# Patient Record
Sex: Female | Born: 1990 | Race: Black or African American | Hispanic: No | Marital: Married | Smoking: Never smoker
Health system: Southern US, Community
[De-identification: ages and names within clinical notes are randomized; demographics above are authoritative.]

## PROBLEM LIST (undated history)

## (undated) ENCOUNTER — Inpatient Hospital Stay (HOSPITAL_COMMUNITY): Payer: Self-pay

## (undated) ENCOUNTER — Ambulatory Visit: Admission: EM

## (undated) DIAGNOSIS — E7800 Pure hypercholesterolemia, unspecified: Secondary | ICD-10-CM

## (undated) DIAGNOSIS — G8929 Other chronic pain: Secondary | ICD-10-CM

## (undated) DIAGNOSIS — R7303 Prediabetes: Secondary | ICD-10-CM

## (undated) DIAGNOSIS — H509 Unspecified strabismus: Secondary | ICD-10-CM

## (undated) DIAGNOSIS — O24419 Gestational diabetes mellitus in pregnancy, unspecified control: Secondary | ICD-10-CM

## (undated) DIAGNOSIS — M546 Pain in thoracic spine: Secondary | ICD-10-CM

## (undated) HISTORY — DX: Prediabetes: R73.03

## (undated) HISTORY — DX: Other chronic pain: G89.29

## (undated) HISTORY — DX: Pure hypercholesterolemia, unspecified: E78.00

## (undated) HISTORY — DX: Unspecified strabismus: H50.9

## (undated) HISTORY — DX: Pain in thoracic spine: M54.6

## (undated) HISTORY — PX: NO PAST SURGERIES: SHX2092

---
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
## 2014-01-17 ENCOUNTER — Encounter: Payer: Self-pay | Admitting: Obstetrics and Gynecology

## 2014-01-17 ENCOUNTER — Ambulatory Visit (INDEPENDENT_AMBULATORY_CARE_PROVIDER_SITE_OTHER): Payer: Self-pay | Admitting: Obstetrics and Gynecology

## 2014-01-17 VITALS — BP 116/98 | HR 103 | Ht 65.25 in | Wt 184.0 lb

## 2014-01-17 DIAGNOSIS — Z113 Encounter for screening for infections with a predominantly sexual mode of transmission: Secondary | ICD-10-CM

## 2014-01-17 DIAGNOSIS — Z3403 Encounter for supervision of normal first pregnancy, third trimester: Secondary | ICD-10-CM

## 2014-01-17 DIAGNOSIS — O0933 Supervision of pregnancy with insufficient antenatal care, third trimester: Secondary | ICD-10-CM | POA: Insufficient documentation

## 2014-01-17 DIAGNOSIS — B951 Streptococcus, group B, as the cause of diseases classified elsewhere: Secondary | ICD-10-CM

## 2014-01-17 DIAGNOSIS — Z118 Encounter for screening for other infectious and parasitic diseases: Secondary | ICD-10-CM

## 2014-01-17 DIAGNOSIS — N390 Urinary tract infection, site not specified: Secondary | ICD-10-CM

## 2014-01-17 DIAGNOSIS — Z124 Encounter for screening for malignant neoplasm of cervix: Secondary | ICD-10-CM

## 2014-01-17 DIAGNOSIS — R8271 Bacteriuria: Secondary | ICD-10-CM

## 2014-01-17 DIAGNOSIS — Z1151 Encounter for screening for human papillomavirus (HPV): Secondary | ICD-10-CM

## 2014-01-17 LAB — POCT URINALYSIS DIP (DEVICE)
Bilirubin Urine: NEGATIVE
GLUCOSE, UA: NEGATIVE mg/dL
Ketones, ur: NEGATIVE mg/dL
Nitrite: NEGATIVE
Protein, ur: NEGATIVE mg/dL
Specific Gravity, Urine: 1.02 (ref 1.005–1.030)
UROBILINOGEN UA: 0.2 mg/dL (ref 0.0–1.0)
pH: 7 (ref 5.0–8.0)

## 2014-01-17 LAB — POCT PREGNANCY, URINE: Preg Test, Ur: POSITIVE — AB

## 2014-01-17 MED ORDER — PRENATAL VITAMINS 0.8 MG PO TABS: 1.0000 | ORAL_TABLET | Freq: Every day | ORAL | Status: DC

## 2014-01-17 MED ORDER — HYDROCORTISONE 1 % EX OINT: 1.0000 "application " | TOPICAL_OINTMENT | Freq: Two times a day (BID) | CUTANEOUS | Status: DC

## 2014-01-17 NOTE — Progress Notes (Deleted)
Nutrition note: 1st visit consult Pt has h/o obesity. Pt has lost 12# @

## 2014-01-17 NOTE — Progress Notes (Signed)
   Subjective:    Morgan Mathews is a G1P0 [id number] being seen today for her first obstetrical visit.  Her obstetrical history is significant for late to care at 32 weeks. Patient does intend to breast feed. Pregnancy history fully reviewed.  Patient reports generalized pruritis. Patient reports onset of pruritis 3 days ago since her arrival from [place]. She is staying with a friend and husband who do not suffer from this.   Filed Vitals:   01/17/14 0840  BP: 116/98  Pulse: 103  Weight: 184 lb (83.462 kg)    HISTORY: OB History  Gravida Para Term Preterm AB SAB TAB Ectopic Multiple Living  1             # Outcome Date GA Lbr Len/2nd Weight Sex Delivery Anes PTL Lv  1 Current              Past Medical History  Diagnosis Date  . Medical history non-contributory    Past Surgical History  Procedure Laterality Date  . No past surgeries     History reviewed. No pertinent family history.   Exam    Uterus:     Pelvic Exam:    Perineum: Normal Perineum   Vulva: normal   Vagina:  normal mucosa, normal discharge   pH:    Cervix: closed and long   Adnexa: not evaluated   Bony Pelvis: gynecoid  System: Breast:  normal appearance, no masses or tenderness   Skin: Multiple skin lesions and scars    Neurologic: oriented, no focal deficits   Extremities: normal strength, tone, and muscle mass   HEENT extra ocular movement intact   Mouth/Teeth mucous membranes moist, pharynx normal without lesions and dental hygiene good   Neck supple and no masses   Cardiovascular: regular rate and rhythm   Respiratory:  chest clear, no wheezing, crepitations, rhonchi, normal symmetric air entry   Abdomen: soft, gravid   Urinary:       Assessment:    Pregnancy: G1P0 Patient Active Problem List   Diagnosis Date Noted  . Late prenatal care affecting pregnancy in third trimester, antepartum 01/17/2014  . Supervision of normal first pregnancy in third trimester 01/17/2014        Plan:      Initial labs drawn. Prenatal vitamins. Problem list reviewed and updated. Genetic Screening discussed : too late.  Ultrasound discussed; fetal survey: ordered. Bile acids collected Rx hydrocortisone  Follow up in 2 weeks. 50% of 30 min visit spent on counseling and coordination of care.     [name] 01/17/2014

## 2014-01-17 NOTE — Progress Notes (Signed)
Ultrasound 01/25/14 @ 2p with Radiology.

## 2014-01-17 NOTE — Patient Instructions (Signed)
Third Trimester of Pregnancy The third trimester is from week 29 through week 42, months 7 through 9. The third trimester is a time when the fetus is growing rapidly. At the end of the ninth month, the fetus is about 20 inches in length and weighs 6-10 pounds.  BODY CHANGES Your body goes through many changes during pregnancy. The changes vary from woman to woman.   Your weight will continue to increase. You can expect to gain 25-35 pounds (11-16 kg) by the end of the pregnancy.  You may begin to get stretch marks on your hips, abdomen, and breasts.  You may urinate more often because the fetus is moving lower into your pelvis and pressing on your bladder.  You may develop or continue to have heartburn as a result of your pregnancy.  You may develop constipation because certain hormones are causing the muscles that push waste through your intestines to slow down.  You may develop hemorrhoids or swollen, bulging veins (varicose veins).  You may have pelvic pain because of the weight gain and pregnancy hormones relaxing your joints between the bones in your pelvis. Backaches may result from overexertion of the muscles supporting your posture.  You may have changes in your hair. These can include thickening of your hair, rapid growth, and changes in texture. Some women also have hair loss during or after pregnancy, or hair that feels dry or thin. Your hair will most likely return to normal after your baby is born.  Your breasts will continue to grow and be tender. A yellow discharge may leak from your breasts called colostrum.  Your belly button may stick out.  You may feel short of breath because of your expanding uterus.  You may notice the fetus "dropping," or moving lower in your abdomen.  You may have a bloody mucus discharge. This usually occurs a few days to a week before labor begins.  Your cervix becomes thin and soft (effaced) near your due date. WHAT TO EXPECT AT YOUR  PRENATAL EXAMS  You will have prenatal exams every 2 weeks until week 36. Then, you will have weekly prenatal exams. During a routine prenatal visit:  You will be weighed to make sure you and the fetus are growing normally.  Your blood pressure is taken.  Your abdomen will be measured to track your baby's growth.  The fetal heartbeat will be listened to.  Any test results from the previous visit will be discussed.  You may have a cervical check near your due date to see if you have effaced. At around 36 weeks, your caregiver will check your cervix. At the same time, your caregiver will also perform a test on the secretions of the vaginal tissue. This test is to determine if a type of bacteria, Group B streptococcus, is present. Your caregiver will explain this further. Your caregiver may ask you:  What your birth plan is.  How you are feeling.  If you are feeling the baby move.  If you have had any abnormal symptoms, such as leaking fluid, bleeding, severe headaches, or abdominal cramping.  If you have any questions. Other tests or screenings that may be performed during your third trimester include:  Blood tests that check for low iron levels (anemia).  Fetal testing to check the health, activity level, and growth of the fetus. Testing is done if you have certain medical conditions or if there are problems during the pregnancy. FALSE LABOR You may feel small, irregular contractions that   eventually go away. These are called Braxton Hicks contractions, or false labor. Contractions may last for hours, days, or even weeks before true labor sets in. If contractions come at regular intervals, intensify, or become painful, it is best to be seen by your caregiver.  SIGNS OF LABOR   Menstrual-like cramps.  Contractions that are 5 minutes apart or less.  Contractions that start on the top of the uterus and spread down to the lower abdomen and back.  A sense of increased pelvic  pressure or back pain.  A watery or bloody mucus discharge that comes from the vagina. If you have any of these signs before the 37th week of pregnancy, call your caregiver right away. You need to go to the hospital to get checked immediately. HOME CARE INSTRUCTIONS   Avoid all smoking, herbs, alcohol, and unprescribed drugs. These chemicals affect the formation and growth of the baby.  Follow your caregiver's instructions regarding medicine use. There are medicines that are either safe or unsafe to take during pregnancy.  Exercise only as directed by your caregiver. Experiencing uterine cramps is a good sign to stop exercising.  Continue to eat regular, healthy meals.  Wear a good support bra for breast tenderness.  Do not use hot tubs, steam rooms, or saunas.  Wear your seat belt at all times when driving.  Avoid raw meat, uncooked cheese, cat litter boxes, and soil used by cats. These carry germs that can cause birth defects in the baby.  Take your prenatal vitamins.  Try taking a stool softener (if your caregiver approves) if you develop constipation. Eat more high-fiber foods, such as fresh vegetables or fruit and whole grains. Drink plenty of fluids to keep your urine clear or pale yellow.  Take warm sitz baths to soothe any pain or discomfort caused by hemorrhoids. Use hemorrhoid cream if your caregiver approves.  If you develop varicose veins, wear support hose. Elevate your feet for 15 minutes, 3-4 times a day. Limit salt in your diet.  Avoid heavy lifting, wear low heal shoes, and practice good posture.  Rest a lot with your legs elevated if you have leg cramps or low back pain.  Visit your dentist if you have not gone during your pregnancy. Use a soft toothbrush to brush your teeth and be gentle when you floss.  A sexual relationship may be continued unless your caregiver directs you otherwise.  Do not travel far distances unless it is absolutely necessary and only  with the approval of your caregiver.  Take prenatal classes to understand, practice, and ask questions about the labor and delivery.  Make a trial run to the hospital.  Pack your hospital bag.  Prepare the baby's nursery.  Continue to go to all your prenatal visits as directed by your caregiver. SEEK MEDICAL CARE IF:  You are unsure if you are in labor or if your water has broken.  You have dizziness.  You have mild pelvic cramps, pelvic pressure, or nagging pain in your abdominal area.  You have persistent nausea, vomiting, or diarrhea.  You have a bad smelling vaginal discharge.  You have pain with urination. SEEK IMMEDIATE MEDICAL CARE IF:   You have a fever.  You are leaking fluid from your vagina.  You have spotting or bleeding from your vagina.  You have severe abdominal cramping or pain.  You have rapid weight loss or gain.  You have shortness of breath with chest pain.  You notice sudden or extreme swelling   of your face, hands, ankles, feet, or legs.  You have not felt your baby move in over an hour.  You have severe headaches that do not go away with medicine.  You have vision changes. Document Released: 01/08/2001 Document Revised: 01/19/2013 Document Reviewed: 03/17/2012 [employer] Patient Information 2015 [employer]. This information is not intended to replace advice given to you by your health care provider. Make sure you discuss any questions you have with your health care provider.  Contraception Choices Contraception (birth control) is the use of any methods or devices to prevent pregnancy. Below are some methods to help avoid pregnancy. HORMONAL METHODS   Contraceptive implant. This is a thin, plastic tube containing progesterone hormone. It does not contain estrogen hormone. Your health care provider inserts the tube in the inner part of the upper arm. The tube can remain in place for up to 3 years. After 3 years, the implant must be removed.  The implant prevents the ovaries from releasing an egg (ovulation), thickens the cervical mucus to prevent sperm from entering the uterus, and thins the lining of the inside of the uterus.  Progesterone-only injections. These injections are given every 3 months by your health care provider to prevent pregnancy. This synthetic progesterone hormone stops the ovaries from releasing eggs. It also thickens cervical mucus and changes the uterine lining. This makes it harder for sperm to survive in the uterus.  Birth control pills. These pills contain estrogen and progesterone hormone. They work by preventing the ovaries from releasing eggs (ovulation). They also cause the cervical mucus to thicken, preventing the sperm from entering the uterus. Birth control pills are prescribed by a health care provider.Birth control pills can also be used to treat heavy periods.  Minipill. This type of birth control pill contains only the progesterone hormone. They are taken every day of each month and must be prescribed by your health care provider.  Birth control patch. The patch contains hormones similar to those in birth control pills. It must be changed once a week and is prescribed by a health care provider.  Vaginal ring. The ring contains hormones similar to those in birth control pills. It is left in the vagina for 3 weeks, removed for 1 week, and then a new one is put back in place. The patient must be comfortable inserting and removing the ring from the vagina.A health care provider's prescription is necessary.  Emergency contraception. Emergency contraceptives prevent pregnancy after unprotected sexual intercourse. This pill can be taken right after sex or up to 5 days after unprotected sex. It is most effective the sooner you take the pills after having sexual intercourse. Most emergency contraceptive pills are available without a prescription. Check with your pharmacist. Do not use emergency contraception as  your only form of birth control. BARRIER METHODS   Female condom. This is a thin sheath (latex or rubber) that is worn over the penis during sexual intercourse. It can be used with spermicide to increase effectiveness.  Female condom. This is a soft, loose-fitting sheath that is put into the vagina before sexual intercourse.  Diaphragm. This is a soft, latex, dome-shaped barrier that must be fitted by a health care provider. It is inserted into the vagina, along with a spermicidal jelly. It is inserted before intercourse. The diaphragm should be left in the vagina for 6 to 8 hours after intercourse.  Cervical cap. This is a round, soft, latex or plastic cup that fits over the cervix and must be   fitted by a health care provider. The cap can be left in place for up to 48 hours after intercourse.  Sponge. This is a soft, circular piece of polyurethane foam. The sponge has spermicide in it. It is inserted into the vagina after wetting it and before sexual intercourse.  Spermicides. These are chemicals that kill or block sperm from entering the cervix and uterus. They come in the form of creams, jellies, suppositories, foam, or tablets. They do not require a prescription. They are inserted into the vagina with an applicator before having sexual intercourse. The process must be repeated every time you have sexual intercourse. INTRAUTERINE CONTRACEPTION  Intrauterine device (IUD). This is a T-shaped device that is put in a woman's uterus during a menstrual period to prevent pregnancy. There are 2 types:  Copper IUD. This type of IUD is wrapped in copper wire and is placed inside the uterus. Copper makes the uterus and fallopian tubes produce a fluid that kills sperm. It can stay in place for 10 years.  Hormone IUD. This type of IUD contains the hormone progestin (synthetic progesterone). The hormone thickens the cervical mucus and prevents sperm from entering the uterus, and it also thins the uterine  lining to prevent implantation of a fertilized egg. The hormone can weaken or kill the sperm that get into the uterus. It can stay in place for 3-5 years, depending on which type of IUD is used. PERMANENT METHODS OF CONTRACEPTION  Female tubal ligation. This is when the woman's fallopian tubes are surgically sealed, tied, or blocked to prevent the egg from traveling to the uterus.  Hysteroscopic sterilization. This involves placing a small coil or insert into each fallopian tube. Your doctor uses a technique called hysteroscopy to do the procedure. The device causes scar tissue to form. This results in permanent blockage of the fallopian tubes, so the sperm cannot fertilize the egg. It takes about 3 months after the procedure for the tubes to become blocked. You must use another form of birth control for these 3 months.  Female sterilization. This is when the female has the tubes that carry sperm tied off (vasectomy).This blocks sperm from entering the vagina during sexual intercourse. After the procedure, the man can still ejaculate fluid (semen). NATURAL PLANNING METHODS  Natural family planning. This is not having sexual intercourse or using a barrier method (condom, diaphragm, cervical cap) on days the woman could become pregnant.  Calendar method. This is keeping track of the length of each menstrual cycle and identifying when you are fertile.  Ovulation method. This is avoiding sexual intercourse during ovulation.  Symptothermal method. This is avoiding sexual intercourse during ovulation, using a thermometer and ovulation symptoms.  Post-ovulation method. This is timing sexual intercourse after you have ovulated. Regardless of which type or method of contraception you choose, it is important that you use condoms to protect against the transmission of sexually transmitted infections (STIs). Talk with your health care provider about which form of contraception is most appropriate for  you. Document Released: 01/14/2005 Document Revised: 01/19/2013 Document Reviewed: 07/09/2012 [employer] Patient Information 2015 [employer]. This information is not intended to replace advice given to you by your health care provider. Make sure you discuss any questions you have with your health care provider.  Breastfeeding Deciding to breastfeed is one of the best choices you can make for you and your baby. A change in hormones during pregnancy causes your breast tissue to grow and increases the number and size of   your milk ducts. These hormones also allow proteins, sugars, and fats from your blood supply to make breast milk in your milk-producing glands. Hormones prevent breast milk from being released before your baby is born as well as prompt milk flow after birth. Once breastfeeding has begun, thoughts of your baby, as well as his or her sucking or crying, can stimulate the release of milk from your milk-producing glands.  BENEFITS OF BREASTFEEDING For Your Baby  Your first milk (colostrum) helps your baby's digestive system function better.   There are antibodies in your milk that help your baby fight off infections.   Your baby has a lower incidence of asthma, allergies, and sudden infant death syndrome.   The nutrients in breast milk are better for your baby than infant formulas and are designed uniquely for your baby's needs.   Breast milk improves your baby's brain development.   Your baby is less likely to develop other conditions, such as childhood obesity, asthma, or type 2 diabetes mellitus.  For You   Breastfeeding helps to create a very special bond between you and your baby.   Breastfeeding is convenient. Breast milk is always available at the correct temperature and costs nothing.   Breastfeeding helps to burn calories and helps you lose the weight gained during pregnancy.   Breastfeeding makes your uterus contract to its prepregnancy size faster and slows  bleeding (lochia) after you give birth.   Breastfeeding helps to lower your risk of developing type 2 diabetes mellitus, osteoporosis, and breast or ovarian cancer later in life. SIGNS THAT YOUR BABY IS HUNGRY Early Signs of Hunger  Increased alertness or activity.  Stretching.  Movement of the head from side to side.  Movement of the head and opening of the mouth when the corner of the mouth or cheek is stroked (rooting).  Increased sucking sounds, smacking lips, cooing, sighing, or squeaking.  Hand-to-mouth movements.  Increased sucking of fingers or hands. Late Signs of Hunger  Fussing.  Intermittent crying. Extreme Signs of Hunger Signs of extreme hunger will require calming and consoling before your baby will be able to breastfeed successfully. Do not wait for the following signs of extreme hunger to occur before you initiate breastfeeding:   Restlessness.  A loud, strong cry.   Screaming. BREASTFEEDING BASICS Breastfeeding Initiation  Find a comfortable place to sit or lie down, with your neck and back well supported.  Place a pillow or rolled up blanket under your baby to bring him or her to the level of your breast (if you are seated). Nursing pillows are specially designed to help support your arms and your baby while you breastfeed.  Make sure that your baby's abdomen is facing your abdomen.   Gently massage your breast. With your fingertips, massage from your chest wall toward your nipple in a circular motion. This encourages milk flow. You may need to continue this action during the feeding if your milk flows slowly.  Support your breast with 4 fingers underneath and your thumb above your nipple. Make sure your fingers are well away from your nipple and your baby's mouth.   Stroke your baby's lips gently with your finger or nipple.   When your baby's mouth is open wide enough, quickly bring your baby to your breast, placing your entire nipple and as  much of the colored area around your nipple (areola) as possible into your baby's mouth.   More areola should be visible above your baby's upper lip than below   the lower lip.   Your baby's tongue should be between his or her lower gum and your breast.   Ensure that your baby's mouth is correctly positioned around your nipple (latched). Your baby's lips should create a seal on your breast and be turned out (everted).  It is common for your baby to suck about 2-3 minutes in order to start the flow of breast milk. Latching Teaching your baby how to latch on to your breast properly is very important. An improper latch can cause nipple pain and decreased milk supply for you and poor weight gain in your baby. Also, if your baby is not latched onto your nipple properly, he or she may swallow some air during feeding. This can make your baby fussy. Burping your baby when you switch breasts during the feeding can help to get rid of the air. However, teaching your baby to latch on properly is still the best way to prevent fussiness from swallowing air while breastfeeding. Signs that your baby has successfully latched on to your nipple:    Silent tugging or silent sucking, without causing you pain.   Swallowing heard between every 3-4 sucks.    Muscle movement above and in front of his or her ears while sucking.  Signs that your baby has not successfully latched on to nipple:   Sucking sounds or smacking sounds from your baby while breastfeeding.  Nipple pain. If you think your baby has not latched on correctly, slip your finger into the corner of your baby's mouth to break the suction and place it between your baby's gums. Attempt breastfeeding initiation again. Signs of Successful Breastfeeding Signs from your baby:   A gradual decrease in the number of sucks or complete cessation of sucking.   Falling asleep.   Relaxation of his or her body.   Retention of a small amount of milk in  his or her mouth.   Letting go of your breast by himself or herself. Signs from you:  Breasts that have increased in firmness, weight, and size 1-3 hours after feeding.   Breasts that are softer immediately after breastfeeding.  Increased milk volume, as well as a change in milk consistency and color by the fifth day of breastfeeding.   Nipples that are not sore, cracked, or bleeding. Signs That Your Baby is Getting Enough Milk  Wetting at least 3 diapers in a 24-hour period. The urine should be clear and pale yellow by age 5 days.  At least 3 stools in a 24-hour period by age 5 days. The stool should be soft and yellow.  At least 3 stools in a 24-hour period by age 7 days. The stool should be seedy and yellow.  No loss of weight greater than 10% of birth weight during the first 3 days of age.  Average weight gain of 4-7 ounces (113-198 g) per week after age 4 days.  Consistent daily weight gain by age 5 days, without weight loss after the age of 2 weeks. After a feeding, your baby may spit up a small amount. This is common. BREASTFEEDING FREQUENCY AND DURATION Frequent feeding will help you make more milk and can prevent sore nipples and breast engorgement. Breastfeed when you feel the need to reduce the fullness of your breasts or when your baby shows signs of hunger. This is called "breastfeeding on demand." Avoid introducing a pacifier to your baby while you are working to establish breastfeeding (the first 4-6 weeks after your baby is born).   After this time you may choose to use a pacifier. Research has shown that pacifier use during the first year of a baby's life decreases the risk of sudden infant death syndrome (SIDS). Allow your baby to feed on each breast as long as he or she wants. Breastfeed until your baby is finished feeding. When your baby unlatches or falls asleep while feeding from the first breast, offer the second breast. Because newborns are often sleepy in the  first few weeks of life, you may need to awaken your baby to get him or her to feed. Breastfeeding times will vary from baby to baby. However, the following rules can serve as a guide to help you ensure that your baby is properly fed:  Newborns (babies 4 weeks of age or younger) may breastfeed every 1-3 hours.  Newborns should not go longer than 3 hours during the day or 5 hours during the night without breastfeeding.  You should breastfeed your baby a minimum of 8 times in a 24-hour period until you begin to introduce solid foods to your baby at around 6 months of age. BREAST MILK PUMPING Pumping and storing breast milk allows you to ensure that your baby is exclusively fed your breast milk, even at times when you are unable to breastfeed. This is especially important if you are going back to work while you are still breastfeeding or when you are not able to be present during feedings. Your lactation consultant can give you guidelines on how long it is safe to store breast milk.  A breast pump is a machine that allows you to pump milk from your breast into a sterile bottle. The pumped breast milk can then be stored in a refrigerator or freezer. Some breast pumps are operated by hand, while others use electricity. Ask your lactation consultant which type will work best for you. Breast pumps can be purchased, but some hospitals and breastfeeding support groups lease breast pumps on a monthly basis. A lactation consultant can teach you how to hand express breast milk, if you prefer not to use a pump.  CARING FOR YOUR BREASTS WHILE YOU BREASTFEED Nipples can become dry, cracked, and sore while breastfeeding. The following recommendations can help keep your breasts moisturized and healthy:  Avoid using soap on your nipples.   Wear a supportive bra. Although not required, special nursing bras and tank tops are designed to allow access to your breasts for breastfeeding without taking off your entire bra  or top. Avoid wearing underwire-style bras or extremely tight bras.  Air dry your nipples for 3-4minutes after each feeding.   Use only cotton bra pads to absorb leaked breast milk. Leaking of breast milk between feedings is normal.   Use lanolin on your nipples after breastfeeding. Lanolin helps to maintain your skin's normal moisture barrier. If you use pure lanolin, you do not need to wash it off before feeding your baby again. Pure lanolin is not toxic to your baby. You may also hand express a few drops of breast milk and gently massage that milk into your nipples and allow the milk to air dry. In the first few weeks after giving birth, some women experience extremely full breasts (engorgement). Engorgement can make your breasts feel heavy, warm, and tender to the touch. Engorgement peaks within 3-5 days after you give birth. The following recommendations can help ease engorgement:  Completely empty your breasts while breastfeeding or pumping. You may want to start by applying warm, moist heat (in   the shower or with warm water-soaked hand towels) just before feeding or pumping. This increases circulation and helps the milk flow. If your baby does not completely empty your breasts while breastfeeding, pump any extra milk after he or she is finished.  Wear a snug bra (nursing or regular) or tank top for 1-2 days to signal your body to slightly decrease milk production.  Apply ice packs to your breasts, unless this is too uncomfortable for you.  Make sure that your baby is latched on and positioned properly while breastfeeding. If engorgement persists after 48 hours of following these recommendations, contact your health care provider or a lactation consultant. OVERALL HEALTH CARE RECOMMENDATIONS WHILE BREASTFEEDING  Eat healthy foods. Alternate between meals and snacks, eating 3 of each per day. Because what you eat affects your breast milk, some of the foods may make your baby more irritable  than usual. Avoid eating these foods if you are sure that they are negatively affecting your baby.  Drink milk, fruit juice, and water to satisfy your thirst (about 10 glasses a day).   Rest often, relax, and continue to take your prenatal vitamins to prevent fatigue, stress, and anemia.  Continue breast self-awareness checks.  Avoid chewing and smoking tobacco.  Avoid alcohol and drug use. Some medicines that may be harmful to your baby can pass through breast milk. It is important to ask your health care provider before taking any medicine, including all over-the-counter and prescription medicine as well as vitamin and herbal supplements. It is possible to become pregnant while breastfeeding. If birth control is desired, ask your health care provider about options that will be safe for your baby. SEEK MEDICAL CARE IF:   You feel like you want to stop breastfeeding or have become frustrated with breastfeeding.  You have painful breasts or nipples.  Your nipples are cracked or bleeding.  Your breasts are red, tender, or warm.  You have a swollen area on either breast.  You have a fever or chills.  You have nausea or vomiting.  You have drainage other than breast milk from your nipples.  Your breasts do not become full before feedings by the fifth day after you give birth.  You feel sad and depressed.  Your baby is too sleepy to eat well.  Your baby is having trouble sleeping.   Your baby is wetting less than 3 diapers in a 24-hour period.  Your baby has less than 3 stools in a 24-hour period.  Your baby's skin or the white part of his or her eyes becomes yellow.   Your baby is not gaining weight by 5 days of age. SEEK IMMEDIATE MEDICAL CARE IF:   Your baby is overly tired (lethargic) and does not want to wake up and feed.  Your baby develops an unexplained fever. Document Released: 01/14/2005 Document Revised: 01/19/2013 Document Reviewed: 07/08/2012 ExitCare  Patient Information 2015 [employer]. This information is not intended to replace advice given to you by your health care provider. Make sure you discuss any questions you have with your health care provider.  

## 2014-01-17 NOTE — Progress Notes (Signed)
Nutrition note: 1st visit consult  Pt has h/o obesity.  Pt has lost 12# @ 7093w4d. Pt reports she had a lot of N/V in the beginning but it has improved so hopefully she will start to gain wt. Pt reports eating ~3x/d but limited in fruits/ vegetables due to limited access to foods currently.  Pt is not taking a PNV but received a prescription today.  Pt reports no heartburn. Pt received verbal & written education via language line (language- Tw[place]) about general nutrition during pregnancy. Discussed safe/ unsafe fish during pregnancy. Pt agrees to start taking a PNV and try to include fruits/ vegetables as well as a protein source with all meals & snacks. Pt does not have WIC but plans to apply. Pt plans to BF. F/u as needed Blondell RevealLaura Germani Gavilanes, MS, RD, LDN, [facility]

## 2014-01-17 NOTE — Progress Notes (Signed)
Pt declined language line, states that she speaks english. Interpreter may be needed for md visit.  Pt has certain lmp of 06/03/13. She has not had any prenatal care.  She has a Spero Gunnels all over her body and feels miserable.  She also reports dizziness. Pt unsure if she wants flu or tdap.

## 2014-01-18 LAB — PRENATAL PROFILE (SOLSTAS)
ANTIBODY SCREEN: NEGATIVE
Basophils Absolute: 0 10*3/uL (ref 0.0–0.1)
Basophils Relative: 0 % (ref 0–1)
EOS PCT: 2 % (ref 0–5)
Eosinophils Absolute: 0.1 10*3/uL (ref 0.0–0.7)
HCT: 30.9 % — ABNORMAL LOW (ref 36.0–46.0)
HEMOGLOBIN: 10.5 g/dL — AB (ref 12.0–15.0)
HIV 1&2 Ab, 4th Generation: NONREACTIVE
Hepatitis B Surface Ag: NEGATIVE
LYMPHS ABS: 1.1 10*3/uL (ref 0.7–4.0)
LYMPHS PCT: 20 % (ref 12–46)
MCH: 33.7 pg (ref 26.0–34.0)
MCHC: 34 g/dL (ref 30.0–36.0)
MCV: 99 fL (ref 78.0–100.0)
MONO ABS: 0.4 10*3/uL (ref 0.1–1.0)
MPV: 11.5 fL (ref 9.4–12.4)
Monocytes Relative: 8 % (ref 3–12)
Neutro Abs: 3.7 10*3/uL (ref 1.7–7.7)
Neutrophils Relative %: 70 % (ref 43–77)
Platelets: 181 10*3/uL (ref 150–400)
RBC: 3.12 MIL/uL — AB (ref 3.87–5.11)
RDW: 13.1 % (ref 11.5–15.5)
RH TYPE: POSITIVE
Rubella: 18.1 Index — ABNORMAL HIGH (ref <0.90)
WBC: 5.3 10*3/uL (ref 4.0–10.5)

## 2014-01-18 LAB — PRESCRIPTION MONITORING PROFILE (19 PANEL)
AMPHETAMINE/METH: NEGATIVE ng/mL
Barbiturate Screen, Urine: NEGATIVE ng/mL
Benzodiazepine Screen, Urine: NEGATIVE ng/mL
Buprenorphine, Urine: NEGATIVE ng/mL
CANNABINOID SCRN UR: NEGATIVE ng/mL
CREATININE, URINE: 106.54 mg/dL (ref >20.0)
Carisoprodol, Urine: NEGATIVE ng/mL
Cocaine Metabolites: NEGATIVE ng/mL
Fentanyl, Ur: NEGATIVE ng/mL
MDMA URINE: NEGATIVE ng/mL
Meperidine, Ur: NEGATIVE ng/mL
Methadone Screen, Urine: NEGATIVE ng/mL
Methaqualone: NEGATIVE ng/mL
Nitrites, Initial: NEGATIVE ug/mL
OPIATE SCREEN, URINE: NEGATIVE ng/mL
OXYCODONE SCRN UR: NEGATIVE ng/mL
PHENCYCLIDINE, UR: NEGATIVE ng/mL
Propoxyphene: NEGATIVE ng/mL
TAPENTADOLUR: NEGATIVE ng/mL
Tramadol Scrn, Ur: NEGATIVE ng/mL
Zolpidem, Urine: NEGATIVE ng/mL
pH, Initial: 7.7 pH (ref 4.5–8.9)

## 2014-01-18 LAB — GLUCOSE TOLERANCE, 1 HOUR (50G) W/O FASTING: Glucose, 1 Hour GTT: 127 mg/dL (ref 70–140)

## 2014-01-19 ENCOUNTER — Telehealth: Payer: Self-pay | Admitting: *Deleted

## 2014-01-19 DIAGNOSIS — O2343 Unspecified infection of urinary tract in pregnancy, third trimester: Principal | ICD-10-CM

## 2014-01-19 DIAGNOSIS — B951 Streptococcus, group B, as the cause of diseases classified elsewhere: Secondary | ICD-10-CM

## 2014-01-19 DIAGNOSIS — R8271 Bacteriuria: Secondary | ICD-10-CM | POA: Insufficient documentation

## 2014-01-19 LAB — BILE ACIDS, TOTAL: Bile Acids Total: 8 umol/L (ref 0–19)

## 2014-01-19 LAB — CYTOLOGY - PAP

## 2014-01-19 LAB — HEMOGLOBINOPATHY EVALUATION
HGB A2 QUANT: 2.5 % (ref 2.2–3.2)
HGB A: 97.5 % (ref 96.8–97.8)
HGB F QUANT: 0 % (ref 0.0–2.0)
Hemoglobin Other: 0 %
Hgb S Quant: 0 %

## 2014-01-19 LAB — CULTURE, OB URINE: Colony Count: 70000

## 2014-01-19 MED ORDER — PENICILLIN V POTASSIUM 500 MG PO TABS: 500.0000 mg | ORAL_TABLET | Freq: Four times a day (QID) | ORAL | Status: DC

## 2014-01-19 NOTE — Telephone Encounter (Signed)
-----   Message from Catalina AntiguaPeggy Constant, MD sent at 01/19/2014  2:59 PM EST ----- Please inform patient of positive UTI with GBS. Antibiotic e-prescribed  Thanks  Kinder Morgan EnergyPeggy

## 2014-01-19 NOTE — Addendum Note (Signed)
Addended by: Catalina AntiguaONSTANT, Eamonn Sermeno on: 01/19/2014 02:59 PM   Modules accepted: Orders

## 2014-01-19 NOTE — Telephone Encounter (Addendum)
Attempted to contact patient, no answer, unable to leave a message.  **Need to know pt's pharmacy information and then e-prescribe Rx.  Diane Day RNC

## 2014-01-24 ENCOUNTER — Encounter (HOSPITAL_COMMUNITY): Payer: Self-pay | Admitting: *Deleted

## 2014-01-24 ENCOUNTER — Inpatient Hospital Stay (HOSPITAL_COMMUNITY)
Admission: AD | Admit: 2014-01-24 | Discharge: 2014-01-24 | Disposition: A | Discharge status: Home / Self Care | Payer: Self-pay | Source: Ambulatory Visit | Attending: Family Medicine | Admitting: Family Medicine

## 2014-01-24 ENCOUNTER — Encounter: Payer: Self-pay | Admitting: Family Medicine

## 2014-01-24 DIAGNOSIS — Z3A33 33 weeks gestation of pregnancy: Secondary | ICD-10-CM | POA: Insufficient documentation

## 2014-01-24 DIAGNOSIS — O4703 False labor before 37 completed weeks of gestation, third trimester: Secondary | ICD-10-CM | POA: Insufficient documentation

## 2014-01-24 DIAGNOSIS — O479 False labor, unspecified: Secondary | ICD-10-CM

## 2014-01-24 LAB — URINE MICROSCOPIC-ADD ON

## 2014-01-24 LAB — URINALYSIS, ROUTINE W REFLEX MICROSCOPIC
Bilirubin Urine: NEGATIVE
Glucose, UA: NEGATIVE mg/dL
KETONES UR: NEGATIVE mg/dL
LEUKOCYTES UA: NEGATIVE
Nitrite: NEGATIVE
Protein, ur: NEGATIVE mg/dL
Specific Gravity, Urine: 1.025 (ref 1.005–1.030)
Urobilinogen, UA: 0.2 mg/dL (ref 0.0–1.0)
pH: 6.5 (ref 5.0–8.0)

## 2014-01-24 LAB — CBC
HCT: 31.7 % — ABNORMAL LOW (ref 36.0–46.0)
Hemoglobin: 10.7 g/dL — ABNORMAL LOW (ref 12.0–15.0)
MCH: 34.9 pg — ABNORMAL HIGH (ref 26.0–34.0)
MCHC: 33.8 g/dL (ref 30.0–36.0)
MCV: 103.3 fL — ABNORMAL HIGH (ref 78.0–100.0)
PLATELETS: 170 10*3/uL (ref 150–400)
RBC: 3.07 MIL/uL — ABNORMAL LOW (ref 3.87–5.11)
RDW: 13.3 % (ref 11.5–15.5)
WBC: 8.6 10*3/uL (ref 4.0–10.5)

## 2014-01-24 LAB — WET PREP, GENITAL
Trich, Wet Prep: NONE SEEN
WBC WET PREP: NONE SEEN
YEAST WET PREP: NONE SEEN

## 2014-01-24 LAB — COMPREHENSIVE METABOLIC PANEL
ALBUMIN: 2.8 g/dL — AB (ref 3.5–5.2)
ALT: 23 U/L (ref 0–35)
ANION GAP: 9 (ref 5–15)
AST: 23 U/L (ref 0–37)
Alkaline Phosphatase: 146 U/L — ABNORMAL HIGH (ref 39–117)
BILIRUBIN TOTAL: 0.8 mg/dL (ref 0.3–1.2)
BUN: 10 mg/dL (ref 6–23)
CHLORIDE: 106 meq/L (ref 96–112)
CO2: 22 mmol/L (ref 19–32)
CREATININE: 0.37 mg/dL — AB (ref 0.50–1.10)
Calcium: 8.7 mg/dL (ref 8.4–10.5)
GFR calc Af Amer: >90 mL/min (ref >90)
GFR calc non Af Amer: >90 mL/min (ref >90)
Glucose, Bld: 73 mg/dL (ref 70–99)
POTASSIUM: 3.8 mmol/L (ref 3.5–5.1)
Sodium: 137 mmol/L (ref 135–145)
TOTAL PROTEIN: 6.4 g/dL (ref 6.0–8.3)

## 2014-01-24 MED ORDER — PENICILLIN V POTASSIUM 500 MG PO TABS: 500.0000 mg | ORAL_TABLET | Freq: Four times a day (QID) | ORAL | Status: DC

## 2014-01-24 MED ORDER — LACTATED RINGERS IV BOLUS (SEPSIS)
1000.0000 mL | Freq: Once | INTRAVENOUS | Status: AC
Start: 2014-01-24 — End: 2014-01-24
  Administered 2014-01-24: 1000 mL via INTRAVENOUS

## 2014-01-24 MED ORDER — NIFEDIPINE 10 MG PO CAPS
20.0000 mg | ORAL_CAPSULE | ORAL | Status: AC
  Administered 2014-01-24 (×3): 20 mg via ORAL
  Filled 2014-01-24 (×3): qty 2

## 2014-01-24 MED ORDER — LACTATED RINGERS IV BOLUS (SEPSIS)
1000.0000 mL | Freq: Once | INTRAVENOUS | Status: AC
  Administered 2014-01-24: 1000 mL via INTRAVENOUS

## 2014-01-24 NOTE — MAU Note (Signed)
stomach has been paining her, inside (points to top)started this morning.  Has been dizzy every day.  Place where she sleeps, there is something biting her, was told it is taking her blood.

## 2014-01-24 NOTE — MAU Note (Signed)
Patient also complains of painful right breast.

## 2014-01-24 NOTE — Telephone Encounter (Signed)
Patient called back and states she uses [facility] on [address]. Med ordered

## 2014-01-24 NOTE — MAU Provider Note (Signed)
History     CSN: [id number]  Arrival date and time: 01/24/14 1503   First Provider Initiated Contact with Patient 01/24/14 1650      Chief Complaint  Patient presents with  . Abdominal Pain  . Dizziness   HPI  Morgan Mathews is a 23 y.o. G1P0 at 8280w4d who presents today with abdominal pain and vomiting. She states that she was also having itching all over. She states that when the pain started she was dizzy. She denies any contractions or vaginal bleeding. She states that the fetus has been moving normally. She has not started prenatal care here in the [place] at this time. She has had intercourse in the last 24 hours.   Past Medical History  Diagnosis Date  . Medical history non-contributory     Past Surgical History  Procedure Laterality Date  . No past surgeries      History reviewed. No pertinent family history.  History  Substance Use Topics  . Smoking status: Never Smoker   . Smokeless tobacco: Never Used  . Alcohol Use: No    Allergies: No Known Allergies  Prescriptions prior to admission  Medication Sig Dispense Refill Last Dose  . hydrocortisone 1 % ointment Apply 1 application topically 2 (two) times daily. 30 g 0 01/23/2014 at Unknown time  . Prenatal Multivit-Min-Fe-FA (PRENATAL VITAMINS) 0.8 MG tablet Take 1 tablet by mouth daily. 30 tablet 12 01/24/2014 at Unknown time  . penicillin v potassium (VEETID) 500 MG tablet Take 1 tablet (500 mg total) by mouth 4 (four) times daily. (Patient not taking: Reported on 01/24/2014) 28 tablet 0     ROS Physical Exam   Blood pressure 111/68, pulse 96, temperature 98.8 F (37.1 C), temperature source Oral, resp. rate 18, weight 83.462 kg (184 lb), last menstrual period 06/03/2013, SpO2 99 %.  Physical Exam  Nursing note and vitals reviewed. Constitutional: She is oriented to person, place, and time. She appears well-developed and well-nourished. No distress.  Cardiovascular: Normal rate.   Respiratory: Effort  normal.  GI: Soft. There is no tenderness. There is no rebound.  Genitourinary:   Cervix: closed/thick/high   Neurological: She is alert and oriented to person, place, and time.  Skin: Skin is warm and dry.  Psychiatric: She has a normal mood and affect.   FHT 130, moderate with 15x 15 accels, no decels Toco: q 8-10 min MAU Course  Procedures  Results for orders placed or performed during the hospital encounter of 01/24/14 (from the past 24 hour(s))  Urinalysis, Routine w reflex microscopic     Status: Abnormal   Collection Time: 01/24/14  3:28 PM  Result Value Ref Range   Color, Urine YELLOW YELLOW   APPearance CLEAR CLEAR   Specific Gravity, Urine 1.025 1.005 - 1.030   pH 6.5 5.0 - 8.0   Glucose, UA NEGATIVE NEGATIVE mg/dL   Hgb urine dipstick SMALL (A) NEGATIVE   Bilirubin Urine NEGATIVE NEGATIVE   Ketones, ur NEGATIVE NEGATIVE mg/dL   Protein, ur NEGATIVE NEGATIVE mg/dL   Urobilinogen, UA 0.2 0.0 - 1.0 mg/dL   Nitrite NEGATIVE NEGATIVE   Leukocytes, UA NEGATIVE NEGATIVE  Urine microscopic-add on     Status: Abnormal   Collection Time: 01/24/14  3:28 PM  Result Value Ref Range   Squamous Epithelial / LPF MANY (A) RARE   WBC, UA 3-6 <3 WBC/hpf   RBC / HPF 3-6 <3 RBC/hpf   Bacteria, UA MANY (A) RARE   Crystals CA OXALATE CRYSTALS (A)  NEGATIVE   Urine-Other AMORPHOUS URATES/PHOSPHATES   CBC     Status: Abnormal   Collection Time: 01/24/14  5:05 PM  Result Value Ref Range   WBC 8.6 4.0 - 10.5 K/uL   RBC 3.07 (L) 3.87 - 5.11 MIL/uL   Hemoglobin 10.7 (L) 12.0 - 15.0 g/dL   HCT 96.031.7 (L) 45.436.0 - 09.846.0 %   MCV 103.3 (H) 78.0 - 100.0 fL   MCH 34.9 (H) 26.0 - 34.0 pg   MCHC 33.8 30.0 - 36.0 g/dL   RDW 11.913.3 14.711.5 - 82.915.5 %   Platelets 170 150 - 400 K/uL  Comprehensive metabolic panel     Status: Abnormal   Collection Time: 01/24/14  5:05 PM  Result Value Ref Range   Sodium 137 135 - 145 mmol/L   Potassium 3.8 3.5 - 5.1 mmol/L   Chloride 106 96 - 112 mEq/L   CO2 22 19 - 32  mmol/L   Glucose, Bld 73 70 - 99 mg/dL   BUN 10 6 - 23 mg/dL   Creatinine, Ser 5.620.37 (L) 0.50 - 1.10 mg/dL   Calcium 8.7 8.4 - 13.010.5 mg/dL   Total Protein 6.4 6.0 - 8.3 g/dL   Albumin 2.8 (L) 3.5 - 5.2 g/dL   AST 23 0 - 37 U/L   ALT 23 0 - 35 U/L   Alkaline Phosphatase 146 (H) 39 - 117 U/L   Total Bilirubin 0.8 0.3 - 1.2 mg/dL   GFR calc non Af Amer >90 >90 mL/min   GFR calc Af Amer >90 >90 mL/min   Anion gap 9 5 - 15   1944: Contractions have spaced some with fluids and procardia. Cervix closed. Unable to collect FFN today. Will DC home with strict PTL warning signs.   Assessment and Plan   1. Braxton Hicks contractions    PTL precautions  Return to MAU as needed  Follow-up Information    [facility]   Specialty:  Obstetrics and Gynecology   Why:  As scheduled for January 4th and Ultrasound appointment on January 5th.    Contact information:   [address] [place] [id number] [phone]       Tawnya CrookHogan, Heather Donovan 01/24/2014, 4:51 PM

## 2014-01-24 NOTE — Discharge Instructions (Signed)
Preterm Labor Information Preterm labor is when labor starts before you are [redacted] weeks pregnant. The normal length of pregnancy is 39 to 41 weeks.  CAUSES  The cause of preterm labor is not often known. The most common known cause is infection. RISK FACTORS  Having a history of preterm labor.  Having your water break before it should.  Having a placenta that covers the opening of the cervix.  Having a placenta that breaks away from the uterus.  Having a cervix that is too weak to hold the baby in the uterus.  Having too much fluid in the amniotic sac.  Taking drugs or smoking while pregnant.  Not gaining enough weight while pregnant.  Being younger than 18 and older than 22 years old.  Having a low income.  Being African American. SYMPTOMS  Period-like cramps, belly (abdominal) pain, or back pain.  Contractions that are regular, as often as six in an hour. They may be mild or painful.  Contractions that start at the top of the belly. They then move to the lower belly and back.  Lower belly pressure that seems to get stronger.  Bleeding from the vagina.  Fluid leaking from the vagina. TREATMENT  Treatment depends on:  Your condition.  The condition of your baby.  How many weeks pregnant you are. Your doctor may have you:  Take medicine to stop contractions.  Stay in bed except to use the restroom (bed rest).  Stay in the hospital. WHAT SHOULD YOU DO IF YOU THINK YOU ARE IN PRETERM LABOR? Call your doctor right away. You need to go to the hospital right away.  HOW CAN YOU PREVENT PRETERM LABOR IN FUTURE PREGNANCIES?  Stop smoking, if you smoke.  Maintain healthy weight gain.  Do not take drugs or be around chemicals that are not needed.  Tell your doctor if you think you have an infection.  Tell your doctor if you had a preterm labor before. Document Released: 04/12/2008 Document Revised: 11/04/2012 Document Reviewed: 04/12/2008 ExitCare Patient  Information 2015 [employer]. This information is not intended to replace advice given to you by your health care provider. Make sure you discuss any questions you have with your health care provider.  

## 2014-01-24 NOTE — Telephone Encounter (Signed)
Called patient and informed her of UTI. Patient verbalized understanding and states she doesn't know the area and will have to call us back about what pharmacy to send a prescription to.

## 2014-01-25 ENCOUNTER — Ambulatory Visit (HOSPITAL_COMMUNITY): Payer: Self-pay

## 2014-01-28 NOTE — L&D Delivery Note (Signed)
Operative Delivery Note At 1:14 AM a viable female was delivered via Vaginal, Vacuum Investment banker, operational(Extractor).  Presentation: vertex; Position: Occiput,, Anterior; Station: +3.  Verbal consent: obtained from patient.  Risks and benefits discussed in detail.  Risks include, but are not limited to the risks of anesthesia, bleeding, infection, damage to maternal tissues, fetal cephalhematoma.  There is also the risk of inability to effect vaginal delivery of the head, or shoulder dystocia that cannot be resolved by established maneuvers, leading to the need for emergency cesarean section.  APGAR: 4, 8; weight 8 lb 12.6 oz (3985 g).   Placenta status: Intact, Spontaneous.   Cord: 3 vessels with the following complications: None.  Cord pH: 7.18  Anesthesia: Local  Instruments: Mighty Vac Episiotomy: Median Lacerations: 3rd degree Suture Repair: 3.0 monocryl Est. Blood Loss (mL): 250  Mom to postpartum.  Baby to Couplet care / Skin to Skin.  EURE,LUTHER H 03/19/2014, 1:41 AM

## 2014-01-31 ENCOUNTER — Ambulatory Visit (INDEPENDENT_AMBULATORY_CARE_PROVIDER_SITE_OTHER): Payer: Self-pay | Admitting: Family Medicine

## 2014-01-31 VITALS — BP 119/73 | HR 106 | Temp 98.3°F | Wt 186.0 lb

## 2014-01-31 DIAGNOSIS — O0933 Supervision of pregnancy with insufficient antenatal care, third trimester: Secondary | ICD-10-CM

## 2014-01-31 DIAGNOSIS — Z3403 Encounter for supervision of normal first pregnancy, third trimester: Secondary | ICD-10-CM

## 2014-01-31 LAB — POCT URINALYSIS DIP (DEVICE)
Bilirubin Urine: NEGATIVE
GLUCOSE, UA: NEGATIVE mg/dL
KETONES UR: NEGATIVE mg/dL
LEUKOCYTES UA: NEGATIVE
Nitrite: NEGATIVE
PROTEIN: NEGATIVE mg/dL
Specific Gravity, Urine: 1.02 (ref 1.005–1.030)
UROBILINOGEN UA: 1 mg/dL (ref 0.0–1.0)
pH: 7 (ref 5.0–8.0)

## 2014-01-31 MED ORDER — HYDROCORTISONE 1 % EX OINT: 1.0000 "application " | TOPICAL_OINTMENT | Freq: Two times a day (BID) | CUTANEOUS | Status: DC

## 2014-01-31 MED ORDER — RANITIDINE HCL 150 MG PO TABS: 150.0000 mg | ORAL_TABLET | Freq: Two times a day (BID) | ORAL | Status: DC

## 2014-01-31 NOTE — Addendum Note (Signed)
Addended by: Louanna Raw on: 01/31/2014 12:40 PM   Modules accepted: Orders

## 2014-01-31 NOTE — Progress Notes (Signed)
Having heartburn - zantac prescribed No contractions, good fetal movement. labor precautions given [place] today

## 2014-02-01 ENCOUNTER — Ambulatory Visit (HOSPITAL_COMMUNITY)
Admission: RE | Admit: 2014-02-01 | Discharge: 2014-02-01 | Disposition: A | Discharge status: Home / Self Care | Payer: Self-pay | Source: Ambulatory Visit | Attending: Obstetrics and Gynecology | Admitting: Obstetrics and Gynecology

## 2014-02-01 DIAGNOSIS — Z3A37 37 weeks gestation of pregnancy: Secondary | ICD-10-CM | POA: Insufficient documentation

## 2014-02-01 DIAGNOSIS — Z3403 Encounter for supervision of normal first pregnancy, third trimester: Secondary | ICD-10-CM

## 2014-02-01 DIAGNOSIS — O0933 Supervision of pregnancy with insufficient antenatal care, third trimester: Secondary | ICD-10-CM | POA: Insufficient documentation

## 2014-02-01 DIAGNOSIS — Z3689 Encounter for other specified antenatal screening: Secondary | ICD-10-CM | POA: Insufficient documentation

## 2014-02-07 ENCOUNTER — Encounter: Payer: Self-pay | Admitting: *Deleted

## 2014-02-11 ENCOUNTER — Inpatient Hospital Stay (HOSPITAL_COMMUNITY)
Admission: AD | Admit: 2014-02-11 | Discharge: 2014-02-11 | Disposition: A | Discharge status: Home / Self Care | Payer: Self-pay | Source: Ambulatory Visit | Attending: Family Medicine | Admitting: Family Medicine

## 2014-02-11 ENCOUNTER — Encounter (HOSPITAL_COMMUNITY): Payer: Self-pay

## 2014-02-11 DIAGNOSIS — N644 Mastodynia: Secondary | ICD-10-CM

## 2014-02-11 DIAGNOSIS — Z3A36 36 weeks gestation of pregnancy: Secondary | ICD-10-CM | POA: Insufficient documentation

## 2014-02-11 DIAGNOSIS — O9229 Other disorders of breast associated with pregnancy and the puerperium: Secondary | ICD-10-CM | POA: Insufficient documentation

## 2014-02-11 MED ORDER — CYCLOBENZAPRINE HCL 5 MG PO TABS: 5.0000 mg | ORAL_TABLET | Freq: Three times a day (TID) | ORAL | Status: DC | PRN

## 2014-02-11 MED ORDER — CYCLOBENZAPRINE HCL 10 MG PO TABS
10.0000 mg | ORAL_TABLET | Freq: Once | ORAL | Status: AC
  Administered 2014-02-11: 10 mg via ORAL
  Filled 2014-02-11: qty 1

## 2014-02-11 NOTE — Discharge Instructions (Signed)

## 2014-02-11 NOTE — MAU Note (Signed)
Pt presents complaining of right breast pain that radiates to her back that started last night. States she had the pain before she got pregnant but it went away and came back last night. Denies vaginal bleeding or discharge. Reports good fetal movement.

## 2014-02-11 NOTE — MAU Provider Note (Signed)
  History     CSN: [id number]  Arrival date and time: 02/11/14 1214   None     Chief Complaint  Patient presents with  . Breast Pain   HPI Morgan Mathews is a 24yo G1 @ 36.1wks by LMP who presents for eval of right breast discomfort that radiates around to her upper right back. This occurred periodically prior to her pregnancy, and has now returned. She reports wearing a well-fitting bra. Denies fever or nipple discharge. No pruritis or erythema on her breast. [facility] and has been unremarkable other than late onset of care @ 32wks.  OB History    Gravida Para Term Preterm AB TAB SAB Ectopic Multiple Living   1               Past Medical History  Diagnosis Date  . Medical history non-contributory     Past Surgical History  Procedure Laterality Date  . No past surgeries      History reviewed. No pertinent family history.  History  Substance Use Topics  . Smoking status: Never Smoker   . Smokeless tobacco: Never Used  . Alcohol Use: No    Allergies: No Known Allergies  Prescriptions prior to admission  Medication Sig Dispense Refill Last Dose  . Prenatal Multivit-Min-Fe-FA (PRENATAL VITAMINS) 0.8 MG tablet Take 1 tablet by mouth daily. 30 tablet 12 Past Week at Unknown time  . hydrocortisone 1 % ointment Apply 1 application topically 2 (two) times daily. (Patient not taking: Reported on 02/11/2014) 30 g 0   . penicillin v potassium (VEETID) 500 MG tablet Take 1 tablet (500 mg total) by mouth 4 (four) times daily. 28 tablet 0 2 weeks ago  . ranitidine (ZANTAC) 150 MG tablet Take 1 tablet (150 mg total) by mouth 2 (two) times daily. (Patient not taking: Reported on 02/11/2014) 60 tablet 1     ROS Physical Exam   Blood pressure 101/59, pulse 89, temperature 98 F (36.7 C), temperature source Oral, resp. rate 18, last menstrual period 06/03/2013.  Physical Exam  Constitutional: She is oriented to person, place, and time. She appears  well-developed.  Cardiovascular: Normal rate.   Respiratory: Effort normal.   Breast exam: Nl prenatal breast changes with ductal tissue palpated; no lumps, dimpling, or color changes noted; breast size very large bilat  GI:  EFM 120s, +accels, no decels Rare ctx  Musculoskeletal: Normal range of motion.  Neurological: She is alert and oriented to person, place, and time.  Skin: Skin is warm and dry.  Psychiatric: She has a normal mood and affect. Her behavior is normal. Thought content normal.    MAU Course  Procedures  Given Flexeril 10mg  in MAU with some relief  Assessment and Plan  IUP@ 36.1wks Right breast discomfort, suspect due to breast size  Reassured re findings Rev'd comfort tips including wearing a well-fitting bra Rx Flexeril 5mg  #30 for back discomfort  Tawny Raspberry CNM 02/11/2014, 3:07 PM

## 2014-02-11 NOTE — MAU Note (Signed)
Urine in lab 

## 2014-02-14 ENCOUNTER — Ambulatory Visit (INDEPENDENT_AMBULATORY_CARE_PROVIDER_SITE_OTHER): Payer: Self-pay | Admitting: Obstetrics & Gynecology

## 2014-02-14 ENCOUNTER — Encounter: Payer: Self-pay | Admitting: Obstetrics & Gynecology

## 2014-02-14 VITALS — BP 115/66 | HR 118 | Temp 98.2°F | Wt 191.3 lb

## 2014-02-14 DIAGNOSIS — Z3403 Encounter for supervision of normal first pregnancy, third trimester: Secondary | ICD-10-CM

## 2014-02-14 DIAGNOSIS — Z23 Encounter for immunization: Secondary | ICD-10-CM

## 2014-02-14 DIAGNOSIS — O0933 Supervision of pregnancy with insufficient antenatal care, third trimester: Secondary | ICD-10-CM

## 2014-02-14 LAB — POCT URINALYSIS DIP (DEVICE)
Bilirubin Urine: NEGATIVE
Glucose, UA: NEGATIVE mg/dL
KETONES UR: NEGATIVE mg/dL
Leukocytes, UA: NEGATIVE
NITRITE: NEGATIVE
PROTEIN: NEGATIVE mg/dL
SPECIFIC GRAVITY, URINE: 1.02 (ref 1.005–1.030)
Urobilinogen, UA: 0.2 mg/dL (ref 0.0–1.0)
pH: 7 (ref 5.0–8.0)

## 2014-02-14 LAB — OB RESULTS CONSOLE GC/CHLAMYDIA
CHLAMYDIA, DNA PROBE: NEGATIVE
Gonorrhea: NEGATIVE

## 2014-02-14 NOTE — Progress Notes (Signed)
Used interpreter [employer] IlsleyMikafui Mathews-Obese. States she is not taking penicillin.

## 2014-02-14 NOTE — Patient Instructions (Signed)
Natural Childbirth Natural childbirth is going through labor and delivery without any drugs to relieve pain. You also do not use fetal monitors, have a cesarean delivery, or get a sugical cut to enlarge the vaginal opening (episiotomy). With the help of a birthing professional (midwife), you will direct your own labor and delivery as you choose. Many women chose natural childbirth because they feel more in control and in touch with their labor and delivery. They are also concerned about the medications affecting themselves and the baby. Pregnant women with a high risk pregnancy should not attempt natural childbirth. It is better to deliver the infant in a hospital if an emergency situation arises. Sometimes, the caregiver has to intervene for the health and safety of the mother and infant. TWO TECHNIQUES FOR NATURAL CHILDBIRTH:   The Lamaze method. This method teaches women that having a baby is normal, healthy, and natural. It also teaches the mother to take a neutral position regarding pain medication and anesthesia and to make an informed decision if and when it is right for them.  The Bradley method (also called husband coached birth). This method teaches the father to be the birth coach and stresses a natural approach. It also encourages exercise and a balanced diet with good nutrition. The exercises teach relaxation and deep breathing techniques. However, there are also classes to prepare the parents for an emergency situation that may occur. METHODS OF DEALING WITH LABOR PAIN AND DELIVERY:  Meditation.  Yoga.  Hypnosis.  Acupuncture.  Massage.  Changing positions (walking, rocking, showering, leaning on birth balls).  Lying in warm water or a jacuzzi.  Find an activity that keeps your mind off of the labor pain.  Listen to soft music.  Visual imagery (focus on a particular object). BEFORE GOING INTO LABOR  Be sure you and your spouse/partner are in agreement to have natural  childbirth.  Decide if your caregiver or a midwife will deliver your baby.  Decide if you will have your baby in the hospital, birthing center, or at home.  If you have children, make plans to have someone to take care of them when you go to the hospital.  Know the distance and the time it takes to go to the delivery center. Make a dry run to be sure.  Have a bag packed with a night gown, bathrobe, and toiletries ready to take when you go into labor.  Keep phone numbers of your family and friends handy if you need to call someone when you go into labor.  Your spouse or partner should go to all the teaching classes.  Talk with your caregiver about the possibility of a medical emergency and what will happen if that occurs. ADVANTAGES OF NATURAL CHILDBIRTH  You are in control of your labor and delivery.  It is safe.  There are no medications or anesthetics that may affect you and the fetus.  There are no invasive procedures such as an episiotomy.  You and your partner will work together, which can increase your bond.  Meditation, yoga, massage, and breathing exercises can be learned while pregnant and help you when you are in labor and at delivery.  In most delivery centers, the family and friends can be involved in the labor and delivery process. DISADVANTAGES OF NATURAL CHILDBIRTH  You will experience pain during your labor and delivery.  The methods of helping relieve your labor pains may not work for you.  You may feel embarrassed, disappointed, and like a failure   if you decide to change your mind during labor and not have natural childbirth. AFTER THE DELIVERY  You will be very tired.  You will be uncomfortable because of your uterus contracting. You will feel soreness around the vagina.  You may feel cold and shaky.This is a natural reaction.  You will be excited, overwhelmed, accomplished, and proud to be a mother. HOME CARE INSTRUCTIONS   Follow the advice and  instructions of your caregiver.  Follow the instructions of your natural childbirth instructor (Lamaze or Bradley Method). Document Released: 12/28/2007 Document Revised: 04/08/2011 Document Reviewed: 09/21/2012 [facility] Patient Information 2015 [place], [place]. This information is not intended to replace advice given to you by your health care provider. Make sure you discuss any questions you have with your health care provider.    Vaginal Delivery During delivery, your health care provider will help you give birth to your baby. During a vaginal delivery, you will work to push the baby out of your vagina. However, before you can push your baby out, a few things need to happen. The opening of your uterus (cervix) has to soften, thin out, and open up (dilate) all the way to 10 cm. Also, your baby has to move down from the uterus into your vagina.  SIGNS OF LABOR  Your health care provider will first need to make sure you are in labor. Signs of labor include:   Passing what is called the mucous plug before labor begins. This is a small amount of blood-stained mucus.  Having regular, painful uterine contractions.   The time between contractions gets shorter.   The discomfort and pain gradually get more intense.  Contraction pains get worse when walking and do not go away when resting.   Your cervix becomes thinner (effacement) and dilates. BEFORE THE DELIVERY Once you are in labor and admitted into the hospital or care center, your health care provider may do the following:   Perform a complete physical exam.  Review any complications related to pregnancy or labor.  Check your blood pressure, pulse, temperature, and heart rate (vital signs).   Determine if, and when, the rupture of amniotic membranes occurred.  Do a vaginal exam (using a sterile glove and lubricant) to determine:   The position (presentation) of the baby. Is the baby's head presenting first (vertex) in the  birth canal (vagina), or are the feet or buttocks first (breech)?   The level (station) of the baby's head within the birth canal.   The effacement and dilatation of the cervix.   An electronic fetal monitor is usually placed on your abdomen when you first arrive. This is used to monitor your contractions and the baby's heart rate.  When the monitor is on your abdomen (external fetal monitor), it can only pick up the frequency and length of your contractions. It cannot tell the strength of your contractions.  If it becomes necessary for your health care provider to know exactly how strong your contractions are or to see exactly what the baby's heart rate is doing, an internal monitor may be inserted into your vagina and uterus. Your health care provider will discuss the benefits and risks of using an internal monitor and obtain your permission before inserting the device.  Continuous fetal monitoring may be needed if you have an epidural, are receiving certain medicines (such as oxytocin), or have pregnancy or labor complications.  An IV access tube may be placed into a vein in your arm to deliver fluids and  medicines if necessary. THREE STAGES OF LABOR AND DELIVERY Normal labor and delivery is divided into three stages. First Stage This stage starts when you begin to contract regularly and your cervix begins to efface and dilate. It ends when your cervix is completely open (fully dilated). The first stage is the longest stage of labor and can last from 3 hours to 15 hours.  Several methods are available to help with labor pain. You and your health care provider will decide which option is best for you. Options include:   Opioid medicines. These are strong pain medicines that you can get through your IV tube or as a shot into your muscle. These medicines lessen pain but do not make it go away completely.  Epidural. A medicine is given through a thin tube that is inserted in your back. The  medicine numbs the lower part of your body and prevents any pain in that area.  Paracervical pain medicine. This is an injection of an anesthetic on each side of your cervix.   You may request natural childbirth, which does not involve the use of pain medicines or an epidural during labor and delivery. Instead, you will use other things, such as breathing exercises, to help cope with the pain. Second Stage The second stage of labor begins when your cervix is fully dilated at 10 cm. It continues until you push your baby down through the birth canal and the baby is born. This stage can take only minutes or several hours.  The location of your baby's head as it moves through the birth canal is reported as a number called a station. If the baby's head has not started its descent, the station is described as being at minus 3 (-3). When your baby's head is at the zero station, it is at the middle of the birth canal and is engaged in the pelvis. The station of your baby helps indicate the progress of the second stage of labor.  When your baby is born, your health care provider may hold the baby with his or her head lowered to prevent amniotic fluid, mucus, and blood from getting into the baby's lungs. The baby's mouth and nose may be suctioned with a small bulb syringe to remove any additional fluid.  Your health care provider may then place the baby on your stomach. It is important to keep the baby from getting cold. To do this, the health care provider will dry the baby off, place the baby directly on your skin (with no blankets between you and the baby), and cover the baby with warm, dry blankets.   The umbilical cord is cut. Third Stage During the third stage of labor, your health care provider will deliver the placenta (afterbirth) and make sure your bleeding is under control. The delivery of the placenta usually takes about 5 minutes but can take up to 30 minutes. After the placenta is delivered, a  medicine may be given either by IV or injection to help contract the uterus and control bleeding. If you are planning to breastfeed, you can try to do so now. After you deliver the placenta, your uterus should contract and get very firm. If your uterus does not remain firm, your health care provider will massage it. This is important because the contraction of the uterus helps cut off bleeding at the site where the placenta was attached to your uterus. If your uterus does not contract properly and stay firm, you may continue to bleed  heavily. If there is a lot of bleeding, medicines may be given to contract the uterus and stop the bleeding.  Document Released: 10/24/2007 Document Revised: 05/31/2013 Document Reviewed: 07/05/2012 [facility] Patient Information 2015 [place], [place]. This information is not intended to replace advice given to you by your health care provider. Make sure you discuss any questions you have with your health care provider.  Cesarean Delivery  Cesarean delivery is the birth of a baby through a cut (incision) in the abdomen and womb (uterus).  LET [facility] PROVIDER KNOW ABOUT:  All medicines you are taking, including vitamins, herbs, eye drops, creams, and over-the-counter medicines.  Previous problems you or members of your family have had with the use of anesthetics.  Any blood disorders you have.  Previous surgeries you have had.  Medical conditions you have.  Any allergies you have.  Complicationsinvolving the pregnancy. RISKS AND COMPLICATIONS  Generally, this is a safe procedure. However, as with any procedure, complications can occur. Possible complications include:  Bleeding.  Infection.  Blood clots.  Injury to surrounding organs.  Problems with anesthesia.  Injury to the baby. BEFORE THE PROCEDURE   You may be given an antacid medicine to drink. This will prevent acid contents in your stomach from going into your lungs if you vomit during  the surgery.  You may be given an antibiotic medicine to prevent infection. PROCEDURE   Hair may be removed from your pubic area and your lower abdomen. This is to prevent infection in the incision site.  A tube (Foley catheter) will be placed in your bladder to drain your urine from your bladder into a bag. This keeps your bladder empty during surgery.  An IV tube will be placed in your vein.  You may be given medicine to numb the lower half of your body (regional anesthetic). If you were in labor, you may have already had an epidural in place which can be used in both labor and cesarean delivery. You may possibly be given medicine to make you sleep (general anesthetic) though this is not as common.  An incision will be made in your abdomen that extends to your uterus. There are 2 basic kinds of incisions:  The horizontal (transverse) incision. Horizontal incisions are from side to side and are used for most routine cesarean deliveries.  The vertical incision. The vertical incision is from the top of the abdomen to the bottom and is less commonly used. It is often done for women who have a serious complication (extreme prematurity) or under emergency situations.  The horizontal and vertical incisions may both be used at the same time. However, this is very uncommon.  An incision is then made in your uterus to deliver the baby.  Your baby will then be delivered.  Both incisions are then closed with absorbable stitches. AFTER THE PROCEDURE   If you were awake during the surgery, you will see your baby right away. If you were asleep, you will see your baby as soon as you are awake.  You may breastfeed your baby after surgery.  You may be able to get up and walk the same day as the surgery. If you need to stay in bed for a period of time, you will receive help to turn, cough, and take deep breaths after surgery. This helps prevent lung problems such as pneumonia.  Do not get out of  bed alone the first time after surgery. You will need help getting out of bed  until you are able to do this by yourself.  You may be able to shower the day after your cesarean delivery. After the bandage (dressing) is taken off the incision site, a nurse will assist you to shower if you would like help.  You will have pneumatic compression hose placed on your lower legs. This is done to prevent blood clots. When you are up and walking regularly, they will no longer be necessary.  Do not cross your legs when you sit.  Save any blood clots that you pass. If you pass a clot while on the toilet, do not flush it. Call for the nurse. Tell the nurse if you think you are bleeding too much or passing too many clots.  You will be given medicine as needed. Let your health care providers know if you are hurting. You may also be given an antibiotic to prevent an infection.  Your IV tube will be taken out when you are drinking a reasonable amount of fluids. The Foley catheter is taken out when you are up and walking.  If your blood type is Rh negative and your baby's blood type is Rh positive, you will be given a shot of anti-D immune globulin. This shot prevents you from having Rh problems with a future pregnancy. You should get the shot even if you had your tubes tied (tubal ligation).  If you are allowed to take the baby for a walk, place the baby in the bassinet and push it. Do not carry your baby in your arms. Document Released: 01/14/2005 Document Revised: 11/04/2012 Document Reviewed: 08/05/2012 [facility] Patient Information 2015 [place], [place]. This information is not intended to replace advice given to you by your health care provider. Make sure you discuss any questions you have with your health care provider.

## 2014-02-14 NOTE — Progress Notes (Signed)
Patient's LMP is exact, will continue using this as her dating criterion Pelvic cultures done today No other complaints or concerns.  Labor and fetal movement precautions reviewed.

## 2014-02-15 LAB — GC/CHLAMYDIA PROBE AMP
CT PROBE, AMP APTIMA: NEGATIVE
GC Probe RNA: NEGATIVE

## 2014-02-21 ENCOUNTER — Encounter: Payer: Self-pay | Admitting: Obstetrics and Gynecology

## 2014-02-28 ENCOUNTER — Ambulatory Visit (INDEPENDENT_AMBULATORY_CARE_PROVIDER_SITE_OTHER): Payer: Self-pay | Admitting: Obstetrics and Gynecology

## 2014-02-28 VITALS — BP 107/69 | HR 96 | Temp 97.9°F | Wt 195.8 lb

## 2014-02-28 DIAGNOSIS — R8271 Bacteriuria: Secondary | ICD-10-CM

## 2014-02-28 DIAGNOSIS — Z23 Encounter for immunization: Secondary | ICD-10-CM

## 2014-02-28 DIAGNOSIS — Z3403 Encounter for supervision of normal first pregnancy, third trimester: Secondary | ICD-10-CM

## 2014-02-28 DIAGNOSIS — B951 Streptococcus, group B, as the cause of diseases classified elsewhere: Secondary | ICD-10-CM

## 2014-02-28 DIAGNOSIS — N390 Urinary tract infection, site not specified: Secondary | ICD-10-CM

## 2014-02-28 LAB — POCT URINALYSIS DIP (DEVICE)
BILIRUBIN URINE: NEGATIVE
Glucose, UA: NEGATIVE mg/dL
Ketones, ur: NEGATIVE mg/dL
NITRITE: NEGATIVE
Protein, ur: NEGATIVE mg/dL
Specific Gravity, Urine: 1.02 (ref 1.005–1.030)
UROBILINOGEN UA: 1 mg/dL (ref 0.0–1.0)
pH: 7 (ref 5.0–8.0)

## 2014-02-28 LAB — OB RESULTS CONSOLE GBS: STREP GROUP B AG: POSITIVE

## 2014-02-28 MED ORDER — TETANUS-DIPHTH-ACELL PERTUSSIS 5-2.5-18.5 LF-MCG/0.5 IM SUSP
0.5000 mL | Freq: Once | INTRAMUSCULAR | Status: AC
  Administered 2014-02-28: 0.5 mL via INTRAMUSCULAR

## 2014-02-28 NOTE — Progress Notes (Signed)
Reports occasional pelvic/groin pain

## 2014-02-28 NOTE — Addendum Note (Signed)
Addended by: Sherre LainASH, AMANDA A on: 02/28/2014 11:34 AM   Modules accepted: Orders

## 2014-02-28 NOTE — Progress Notes (Signed)
Used [employer] for visit today. Doing well. Late to prenatal care.  1. Routine PNC. Labwork up to date. Tdap today. GBS positive, needs ABX in labor. Patient aware. FM/Labor precautions reviewed.

## 2014-03-07 ENCOUNTER — Encounter: Payer: Self-pay | Admitting: Obstetrics and Gynecology

## 2014-03-10 ENCOUNTER — Ambulatory Visit (INDEPENDENT_AMBULATORY_CARE_PROVIDER_SITE_OTHER): Payer: Self-pay | Admitting: Obstetrics and Gynecology

## 2014-03-10 ENCOUNTER — Encounter: Payer: Self-pay | Admitting: Obstetrics and Gynecology

## 2014-03-10 VITALS — BP 117/60 | HR 100 | Temp 97.5°F | Wt 194.7 lb

## 2014-03-10 DIAGNOSIS — O0933 Supervision of pregnancy with insufficient antenatal care, third trimester: Secondary | ICD-10-CM

## 2014-03-10 DIAGNOSIS — R51 Headache: Secondary | ICD-10-CM

## 2014-03-10 DIAGNOSIS — Z3403 Encounter for supervision of normal first pregnancy, third trimester: Secondary | ICD-10-CM

## 2014-03-10 DIAGNOSIS — R8271 Bacteriuria: Secondary | ICD-10-CM

## 2014-03-10 DIAGNOSIS — N390 Urinary tract infection, site not specified: Secondary | ICD-10-CM

## 2014-03-10 DIAGNOSIS — R519 Headache, unspecified: Secondary | ICD-10-CM

## 2014-03-10 DIAGNOSIS — B951 Streptococcus, group B, as the cause of diseases classified elsewhere: Secondary | ICD-10-CM

## 2014-03-10 LAB — POCT URINALYSIS DIP (DEVICE)
Glucose, UA: NEGATIVE mg/dL
Ketones, ur: NEGATIVE mg/dL
Leukocytes, UA: NEGATIVE
Nitrite: NEGATIVE
PROTEIN: NEGATIVE mg/dL
Specific Gravity, Urine: 1.02 (ref 1.005–1.030)
UROBILINOGEN UA: 2 mg/dL — AB (ref 0.0–1.0)
pH: 7 (ref 5.0–8.0)

## 2014-03-10 MED ORDER — DOCUSATE SODIUM 100 MG PO CAPS: 100.0000 mg | ORAL_CAPSULE | Freq: Two times a day (BID) | ORAL | Status: DC

## 2014-03-10 MED ORDER — ACETAMINOPHEN 325 MG PO TABS: 650.0000 mg | ORAL_TABLET | Freq: Once | ORAL | Status: DC

## 2014-03-10 MED ORDER — ACETAMINOPHEN 500 MG PO TABS: 500.0000 mg | ORAL_TABLET | Freq: Four times a day (QID) | ORAL | Status: DC | PRN

## 2014-03-10 NOTE — Progress Notes (Signed)
Patient is doing well. She reports a migraine headache since this morning for which she did not take anything for. Rx Tylenol provided as patient is not familiar with tylenol. Patient reports right breast and axilla pain. Normal breast exam. Pain consistent with normal breast tenderness in pregnancy. Reassurance provided. Patient will come for post dates fetal testing on 2/15 with plans for IOL at 41 weeks on 2/18 FM/labor precautions reviewed

## 2014-03-10 NOTE — Addendum Note (Signed)
Addended by: Faythe CasaBELLAMY, JEANETTA M on: 03/10/2014 02:34 PM   Modules accepted: Orders

## 2014-03-10 NOTE — Progress Notes (Signed)
[employer] # [id number] Pt is observed grabbing her head and grimacing- she is complaining of headache "off and on"; headache started at 0300.  Gave pt tylenol 650 mg po once for headache. Pt reports pain in right breast and right axilla- "feels like something in there"

## 2014-03-10 NOTE — Progress Notes (Signed)
Pt provided info to [facility], cost $1.00.  IOL am per MD 03/17/14 @ 730a. [employer] Inteleg[employer]ie [employer] Child psychotherapistsocial worker present.

## 2014-03-14 ENCOUNTER — Telehealth (HOSPITAL_COMMUNITY): Payer: Self-pay | Admitting: *Deleted

## 2014-03-14 ENCOUNTER — Other Ambulatory Visit: Payer: Self-pay

## 2014-03-14 NOTE — Telephone Encounter (Signed)
Preadmission screen  

## 2014-03-15 ENCOUNTER — Ambulatory Visit (INDEPENDENT_AMBULATORY_CARE_PROVIDER_SITE_OTHER): Payer: Self-pay | Admitting: *Deleted

## 2014-03-15 VITALS — BP 119/66 | HR 90

## 2014-03-15 DIAGNOSIS — O480 Post-term pregnancy: Secondary | ICD-10-CM

## 2014-03-15 LAB — US OB FOLLOW UP

## 2014-03-15 NOTE — Progress Notes (Signed)
NST reactive.

## 2014-03-15 NOTE — Progress Notes (Signed)
[employer] # [id number]  used for encounter today.  Social Work intern [employer] present for encounter today. Pt expressed several concerns via interpreter. She states that she has not had a bowel movement for 1 month. She has been using the stool softeners as directed on 2/11 with no results. I consulted with Nada MaclachlanKaren Teague-Clark, PA and then advised pt the following. I advised her to obtain and self administer a Fleet enema - instructions given. I also advised her that if she does not have adequate results from the enema, she may take 1 Dulcolax tablet. Pt voiced understanding.  Pt also brought numerous bills and stated that she had applied for financial assistance and now does not know what to do with the bills. I called Patient Accounting dept on pt's behalf and was told they had received her application. A message will be sent to an accounting representative to review her application and send correspondence to pt if further information is needed from her. Pt was advised of this information and voiced understanding. I also told pt and the Social Work intern that she will need to contact [employer] regarding the bills she has received from them. They have a separate financial assistance application process. Pt voiced understanding. Pt is scheduled for IOL on 2/18 and is aware of the appt. Labor sx and when to return to hospital were reviewed.

## 2014-03-17 ENCOUNTER — Encounter (HOSPITAL_COMMUNITY): Payer: Self-pay

## 2014-03-17 ENCOUNTER — Inpatient Hospital Stay (HOSPITAL_COMMUNITY)
Admission: RE | Admit: 2014-03-17 | Discharge: 2014-03-21 | DRG: 775 | Disposition: A | Discharge status: Home / Self Care | Payer: Medicaid Other | Source: Ambulatory Visit | Attending: Obstetrics & Gynecology | Admitting: Obstetrics & Gynecology

## 2014-03-17 DIAGNOSIS — O480 Post-term pregnancy: Secondary | ICD-10-CM | POA: Diagnosis present

## 2014-03-17 DIAGNOSIS — O99824 Streptococcus B carrier state complicating childbirth: Principal | ICD-10-CM | POA: Diagnosis present

## 2014-03-17 DIAGNOSIS — O0933 Supervision of pregnancy with insufficient antenatal care, third trimester: Secondary | ICD-10-CM

## 2014-03-17 DIAGNOSIS — Z3A41 41 weeks gestation of pregnancy: Secondary | ICD-10-CM | POA: Diagnosis present

## 2014-03-17 DIAGNOSIS — Z3403 Encounter for supervision of normal first pregnancy, third trimester: Secondary | ICD-10-CM | POA: Diagnosis present

## 2014-03-17 LAB — ABO/RH: ABO/RH(D): B POS

## 2014-03-17 LAB — CBC
HCT: 34.6 % — ABNORMAL LOW (ref 36.0–46.0)
Hemoglobin: 11.4 g/dL — ABNORMAL LOW (ref 12.0–15.0)
MCH: 32.7 pg (ref 26.0–34.0)
MCHC: 32.9 g/dL (ref 30.0–36.0)
MCV: 99.1 fL (ref 78.0–100.0)
PLATELETS: 184 10*3/uL (ref 150–400)
RBC: 3.49 MIL/uL — ABNORMAL LOW (ref 3.87–5.11)
RDW: 13.4 % (ref 11.5–15.5)
WBC: 5.2 10*3/uL (ref 4.0–10.5)

## 2014-03-17 LAB — TYPE AND SCREEN
ABO/RH(D): B POS
ANTIBODY SCREEN: NEGATIVE

## 2014-03-17 MED ORDER — ONDANSETRON HCL 4 MG/2ML IJ SOLN: 4.0000 mg | Freq: Four times a day (QID) | INTRAMUSCULAR | Status: DC | PRN

## 2014-03-17 MED ORDER — MISOPROSTOL 25 MCG QUARTER TABLET
25.0000 ug | ORAL_TABLET | ORAL | Status: DC | PRN
  Administered 2014-03-17: 25 ug via VAGINAL
  Filled 2014-03-17: qty 0.25
  Filled 2014-03-17: qty 1

## 2014-03-17 MED ORDER — LACTATED RINGERS IV SOLN
INTRAVENOUS | Status: DC
  Administered 2014-03-17 – 2014-03-18 (×3): via INTRAVENOUS

## 2014-03-17 MED ORDER — LACTATED RINGERS IV SOLN
500.0000 mL | INTRAVENOUS | Status: DC | PRN
  Administered 2014-03-17 – 2014-03-18 (×2): 500 mL via INTRAVENOUS

## 2014-03-17 MED ORDER — OXYCODONE-ACETAMINOPHEN 5-325 MG PO TABS
2.0000 | ORAL_TABLET | ORAL | Status: DC | PRN
  Administered 2014-03-17 – 2014-03-18 (×2): 2 via ORAL
  Filled 2014-03-17: qty 2

## 2014-03-17 MED ORDER — ACETAMINOPHEN 325 MG PO TABS
650.0000 mg | ORAL_TABLET | ORAL | Status: DC | PRN
  Administered 2014-03-17: 650 mg via ORAL
  Filled 2014-03-17: qty 2

## 2014-03-17 MED ORDER — TERBUTALINE SULFATE 1 MG/ML IJ SOLN: 0.2500 mg | Freq: Once | INTRAMUSCULAR | Status: AC | PRN

## 2014-03-17 MED ORDER — PENICILLIN G POTASSIUM 5000000 UNITS IJ SOLR
2.5000 10*6.[IU] | INTRAVENOUS | Status: DC
  Filled 2014-03-17 (×7): qty 2.5

## 2014-03-17 MED ORDER — OXYTOCIN 40 UNITS IN LACTATED RINGERS INFUSION - SIMPLE MED
62.5000 mL/h | INTRAVENOUS | Status: DC
Start: 2014-03-17 — End: 2014-03-19

## 2014-03-17 MED ORDER — LACTATED RINGERS IV SOLN
INTRAVENOUS | Status: DC
  Administered 2014-03-17: 08:00:00 via INTRAVENOUS

## 2014-03-17 MED ORDER — OXYCODONE-ACETAMINOPHEN 5-325 MG PO TABS
1.0000 | ORAL_TABLET | ORAL | Status: DC | PRN
  Filled 2014-03-17 (×3): qty 1

## 2014-03-17 MED ORDER — OXYTOCIN BOLUS FROM INFUSION
500.0000 mL | INTRAVENOUS | Status: DC
Start: 2014-03-17 — End: 2014-03-19

## 2014-03-17 MED ORDER — CITRIC ACID-SODIUM CITRATE 334-500 MG/5ML PO SOLN: 30.0000 mL | ORAL | Status: DC | PRN

## 2014-03-17 MED ORDER — LIDOCAINE HCL (PF) 1 % IJ SOLN
30.0000 mL | INTRAMUSCULAR | Status: DC | PRN
  Administered 2014-03-19: 30 mL via SUBCUTANEOUS
  Filled 2014-03-17: qty 30

## 2014-03-17 MED ORDER — FENTANYL CITRATE 0.05 MG/ML IJ SOLN
100.0000 ug | INTRAMUSCULAR | Status: DC | PRN
  Administered 2014-03-18 (×3): 100 ug via INTRAVENOUS
  Filled 2014-03-17 (×3): qty 2

## 2014-03-17 MED ORDER — DEXTROSE 5 % IV SOLN
5.0000 10*6.[IU] | Freq: Once | INTRAVENOUS | Status: DC
  Filled 2014-03-17: qty 5

## 2014-03-17 MED ORDER — FLEET ENEMA 7-19 GM/118ML RE ENEM: 1.0000 | ENEMA | RECTAL | Status: DC | PRN

## 2014-03-17 NOTE — Progress Notes (Signed)
Attempted foley bulb by provider, unable to place.

## 2014-03-17 NOTE — Progress Notes (Signed)
@   RN's in room to facilitate move

## 2014-03-17 NOTE — H&P (Signed)
LABOR ADMISSION HISTORY AND PHYSICAL  Morgan Mathews is a 23 y.o. female G1P0 with IUP at [redacted]w[redacted]d by sure LMP or 42.4 by third trimester [place] presenting for IOL for post dates.   Speaks English Twi and A[place]. Declined interpreter.   PNC care at [facility] since ~32 wks  Prenatal History/Complications: Late to care, Post-dates Clinic  [facility] Prenatal Labs  Dating  by LMP will compare to 3rd trimester [place] Blood type: B pos  Genetic Screen Too late Antibody: Neg  Anatomic [place] Normal female, but limited third trimester Rubella: Immune  GTT Third trimester: 127 RPR: NR  TDaP vaccine 02/28/14 HBsAg: Neg  Flu vaccine  02/14/14 HIV: NR  GBS  positive GBS: positive in urine  Contraception  Pap:Neg, but absent transformation zone. Unknown HPV  Baby Food Breast   Circumcision    Pediatrician    Support Person FOB    Past Medical History: Past Medical History  Diagnosis Date  . Medical history non-contributory     Past Surgical History: Past Surgical History  Procedure Laterality Date  . No past surgeries      Obstetrical History: OB History    Gravida Para Term Preterm AB TAB SAB Ectopic Multiple Living   1               Social History: History   Social History  . Marital Status: Married    Spouse Name: N/A  . Number of Children: N/A  . Years of Education: N/A   Social History Main Topics  . Smoking status: Never Smoker   . Smokeless tobacco: Never Used  . Alcohol Use: No  . Drug Use: No  . Sexual Activity: Yes    Birth Control/ Protection: None   Other Topics Concern  . None   Social History Narrative    Family History: History reviewed. No pertinent family history.  Allergies: No Known Allergies  Facility-administered medications prior to admission  Medication Dose Route Frequency Provider Last Rate Last Dose  . acetaminophen (TYLENOL) tablet 650 mg  650 mg Oral Once Catalina Antigua, MD   650 mg at 03/10/14 1418   Prescriptions prior to admission  Medication Sig Dispense  Refill Last Dose  . acetaminophen (TYLENOL) 500 MG tablet Take 1 tablet (500 mg total) by mouth every 6 (six) hours as needed. (Patient taking differently: Take 500 mg by mouth every 6 (six) hours as needed for moderate pain. ) 30 tablet 0 03/16/2014 at Unknown time  . docusate sodium (COLACE) 100 MG capsule Take 1 capsule (100 mg total) by mouth 2 (two) times daily. 10 capsule 0 03/16/2014 at Unknown time  . Prenatal Multivit-Min-Fe-FA (PRENATAL VITAMINS) 0.8 MG tablet Take 1 tablet by mouth daily. 30 tablet 12 03/03/2014     Review of Systems   Review of Systems  Constitutional: Negative for fever and chills.  Eyes: Negative for blurred vision.  Gastrointestinal: Negative for nausea, vomiting and abdominal pain.  Neurological: Negative for headaches.     Blood pressure 118/72, pulse 81, temperature 97.8 F (36.6 C), temperature source Oral, resp. rate 18, height  (1.651 m), last menstrual period 06/03/2013. General appearance: alert, cooperative, appears stated age and no distress Lungs: clear to auscultation bilaterally Heart: regular rate and rhythm Abdomen: soft, non-tender; bowel sounds normal Pelvic: NEFG, No bleeding, lesions or LOF Extremities: Homans sign is negative, no sign of DVT DTR's 2+ Presentation: cephalic Fetal monitoringBaseline: 140 bpm, Variability: Good {> 6 bpm), Accelerations: Reactive and Decelerations: Absent Uterine activityFrequency:  Every 3-5 minutes, mild Dilation: 1 Effacement (%): 80 Station: -2 Exam by:: [email or web address],rnc-ob   Attempted Foley bulb placement. Unable to place.   Prenatal labs: ABO, Rh: --/--/B POS (02/18 0805) Antibody: PENDING (02/18 0805) Rubella:  Immune RPR: NON REAC (12/21 1539)  HBsAg: NEGATIVE (12/21 1539)  HIV: NONREACTIVE (12/21 1539)  GBS: Positive (02/01 0000)  1 hr Glucola 127 Genetic screening  Too late Anatomy US Normal female, but limited due to late [place]   Prenatal Transfer Tool  Maternal Diabetes: No Genetic  Screening: Declined Maternal Ultrasounds/Referrals: Normal Fetal Ultrasounds or other Referrals:  None Maternal Substance Abuse:  No Significant Maternal Medications:  None Significant Maternal Lab Results: Lab values include: Group B Strep positive   Results for orders placed or performed during the hospital encounter of 03/17/14 (from the past 24 hour(s))  CBC   Collection Time: 03/17/14  8:05 AM  Result Value Ref Range   WBC 5.2 4.0 - 10.5 K/uL   RBC 3.49 (L) 3.87 - 5.11 MIL/uL   Hemoglobin 11.4 (L) 12.0 - 15.0 g/dL   HCT 16.134.6 (L) 09.636.0 - 04.546.0 %   MCV 99.1 78.0 - 100.0 fL   MCH 32.7 26.0 - 34.0 pg   MCHC 32.9 30.0 - 36.0 g/dL   RDW 40.913.4 81.111.5 - 91.415.5 %   Platelets 184 150 - 400 K/uL  Type and screen   Collection Time: 03/17/14  8:05 AM  Result Value Ref Range   ABO/RH(D) B POS    Antibody Screen PENDING    Sample Expiration 03/20/2014    Assessment: 1. Labor: IOL 2. Fetal Wellbeing: Category I  3. Pain Control: None 4. GBS: Pos 5. 41-43.4 week IUP. Dated by 3 trimester US measuring 3.5 weeks ahead.   Plan:  1. Admit to BS per consult with MD 2. Routine L&D orders 3. Analgesia/anesthesia PRN  4. Cytotec  Dorathy KinsmanSMITH, [name] 03/17/2014, 9:48 AM

## 2014-03-17 NOTE — Progress Notes (Signed)
Patient ID: Morgan Mathews, female   DOB: 1990/06/02, 24 y.o.   MRN: [id number] Morgan Mathews is a 24 y.o. G1P0 at 1675w0d.  Subjective: Unaware of contractions.   Objective: BP 92/53 mmHg  Pulse 92  Temp(Src) 98.9 F (37.2 C) (Oral)  Resp 20  Ht 5\' 5"  (1.651 m)  Wt 87.998 kg (194 lb)  BMI 32.28 kg/m2  LMP 06/03/2013 (Exact Date)   FHT:  FHR: 130 bpm, variability: mod,  accelerations:  15x15,  decelerations:  none UC:   Q 3-4 minutes, mild  Contracting too much for additional cytotec.  Dilation: 1.5 Effacement (%): 80 Station: -2 Presentation: Vertex Exam by:: VSmith,cnm  Foley bulb placed w/ out difficulty. Placement verified after removal of speculum.   Labs: Results for orders placed or performed during the hospital encounter of 03/17/14 (from the past 24 hour(s))  CBC     Status: Abnormal   Collection Time: 03/17/14  8:05 AM  Result Value Ref Range   WBC 5.2 4.0 - 10.5 K/uL   RBC 3.49 (L) 3.87 - 5.11 MIL/uL   Hemoglobin 11.4 (L) 12.0 - 15.0 g/dL   HCT 40.934.6 (L) 81.136.0 - 91.446.0 %   MCV 99.1 78.0 - 100.0 fL   MCH 32.7 26.0 - 34.0 pg   MCHC 32.9 30.0 - 36.0 g/dL   RDW 78.213.4 95.611.5 - 21.315.5 %   Platelets 184 150 - 400 K/uL  Type and screen     Status: None   Collection Time: 03/17/14  8:05 AM  Result Value Ref Range   ABO/RH(D) B POS    Antibody Screen NEG    Sample Expiration 03/20/2014   ABO/Rh     Status: None   Collection Time: 03/17/14  8:05 AM  Result Value Ref Range   ABO/RH(D) B POS     Assessment / Plan: 2575w0d week IUP Labor: IOL Fetal Wellbeing:  Category I Pain Control:  None Anticipated MOD:  SVD  [place] Sirenity Shew, CNM 03/17/2014 7:27 PM

## 2014-03-18 LAB — RPR: RPR: NONREACTIVE

## 2014-03-18 MED ORDER — PENICILLIN G POTASSIUM 5000000 UNITS IJ SOLR
5.0000 10*6.[IU] | Freq: Once | INTRAVENOUS | Status: AC
  Administered 2014-03-18: 5 10*6.[IU] via INTRAVENOUS
  Filled 2014-03-18: qty 5

## 2014-03-18 MED ORDER — BUTORPHANOL TARTRATE 1 MG/ML IJ SOLN: 2.0000 mg | Freq: Once | INTRAMUSCULAR | Status: DC

## 2014-03-18 MED ORDER — TERBUTALINE SULFATE 1 MG/ML IJ SOLN: 0.2500 mg | Freq: Once | INTRAMUSCULAR | Status: AC | PRN

## 2014-03-18 MED ORDER — OXYTOCIN 40 UNITS IN LACTATED RINGERS INFUSION - SIMPLE MED
1.0000 m[IU]/min | INTRAVENOUS | Status: DC
  Administered 2014-03-18: 4 m[IU]/min via INTRAVENOUS

## 2014-03-18 MED ORDER — BUTORPHANOL TARTRATE 1 MG/ML IJ SOLN
2.0000 mg | Freq: Once | INTRAMUSCULAR | Status: AC
  Administered 2014-03-18: 2 mg via INTRAVENOUS
  Filled 2014-03-18: qty 2

## 2014-03-18 MED ORDER — PENICILLIN G POTASSIUM 5000000 UNITS IJ SOLR
2.5000 10*6.[IU] | INTRAVENOUS | Status: DC
  Administered 2014-03-18 (×5): 2.5 10*6.[IU] via INTRAVENOUS
  Filled 2014-03-18 (×9): qty 2.5

## 2014-03-18 MED ORDER — OXYTOCIN 40 UNITS IN LACTATED RINGERS INFUSION - SIMPLE MED
1.0000 m[IU]/min | INTRAVENOUS | Status: DC
  Administered 2014-03-18: 2 m[IU]/min via INTRAVENOUS
  Filled 2014-03-18: qty 1000

## 2014-03-18 NOTE — Progress Notes (Signed)
Morgan Mathews is a 24 y.o. G1P0 at 7262w1d   Subjective: Comfortable other than residual headache mostly on right temple; not feeling ctx  Objective: BP 125/76 mmHg  Pulse 105  Temp(Src) 98.5 F (36.9 C) (Oral)  Resp 18  Ht 5\' 5"  (1.651 m)  Wt 87.998 kg (194 lb)  BMI 32.28 kg/m2  LMP 06/03/2013 (Exact Date)      FHT:  FHR: 120s bpm, variability: moderate,  accelerations:  Present,  decelerations:  Absent UC:   irregular, every 2-6 minutes w/ 788mu/min Pit SVE:   Dilation: 4.5 Effacement (%): 80 Station: -2 Exam by:: Dennis BastVeronica Mensah- exam deferred presently  Labs: Lab Results  Component Value Date   WBC 5.2 03/17/2014   HGB 11.4* 03/17/2014   HCT 34.6* 03/17/2014   MCV 99.1 03/17/2014   PLT 184 03/17/2014    Assessment / Plan: IOL process Cx favorable  Will continue increasing Pitocin to achieve adequate ctx Try Stadol for H/A  Cam HaiSHAW, KIMBERLY CNM 03/18/2014, 10:17 AM

## 2014-03-18 NOTE — Progress Notes (Signed)
Morgan Mathews is a 24 y.o. G1P0 at 8261w1d admitted for induction of labor due to Post dates. Due date 03/10/2014.  Subjective: Uncomfortable, c/o pressure and urge to push.  Objective: BP 132/84 mmHg  Pulse 96  Temp(Src) 98.6 F (37 C) (Oral)  Resp 18  Ht 5\' 5"  (1.651 m)  Wt 87.998 kg (194 lb)  BMI 32.28 kg/m2  LMP 06/03/2013 (Exact Date)    FHT:  FHR: 120 bpm, variability: moderate,  accelerations:  Present,  decelerations:  Absent UC:   regular, every 1-3 minutes  SVE:   Dilation: 5 Effacement (%): 90 Station: -1 Exam by:: hk  Pitocin @ 14 mu/min  Labs: Lab Results  Component Value Date   WBC 5.2 03/17/2014   HGB 11.4* 03/17/2014   HCT 34.6* 03/17/2014   MCV 99.1 03/17/2014   PLT 184 03/17/2014    Assessment / Plan: Induction of labor due to post-dates,  progressing well on pitocin  Labor: Progressing normally Fetal Wellbeing:  Category I Pain Control:  Fentanyl, declines epidural at this time Pre-eclampsia: no s/s I/D:  GBS positive Anticipated MOD:  NSVD  Brylynn Hanssen Wynne SNM 03/18/2014, 5:19 PM

## 2014-03-18 NOTE — Progress Notes (Signed)
Golda Naef is a 24 y.o. G1P0 at 170w1d admitted for induction of labor due to Post dates. Due date 03/11/2014. Subjective: Feeling pressure, urge to push.  Objective: BP 123/79 mmHg  Pulse 102  Temp(Src) 98.6 F (37 C) (Oral)  Resp 18  Ht 5\' 5"  (1.651 m)  Wt 87.998 kg (194 lb)  BMI 32.28 kg/m2  LMP 06/03/2013 (Exact Date)    FHT:  FHR: 125 bpm, variability: moderate,  accelerations:  Present,  decelerations:  Absent UC:   regular, every 2 minutes  SVE:   Dilation: 8 Effacement (%): 100 Station: +1 Exam by:: Selena BattenKim NM Student  Pitocin @ 10 mu/min  Labs: Lab Results  Component Value Date   WBC 5.2 03/17/2014   HGB 11.4* 03/17/2014   HCT 34.6* 03/17/2014   MCV 99.1 03/17/2014   PLT 184 03/17/2014    Assessment / Plan: Induction of labor due to post dates,  progressing well on pitocin  Labor: Progressing normally Fetal Wellbeing:  Category I Pain Control:  Fentanyl Pre-eclampsia: no s/s I/D:  GBS positive Anticipated MOD:  NSVD  Deigo Alonso Wynne SNM 03/18/2014, 8:16 PM

## 2014-03-18 NOTE — Progress Notes (Signed)
LABOR PROGRESS NOTE  Morgan Mathews is a 24 y.o. G1P0 at 4856w1d  admitted for induction of labor 2/2 post dates  Subjective: Called to room 2/2 deceleration  Objective: BP 120/72 mmHg  Pulse 86  Temp(Src) 98 F (36.7 C) (Oral)  Resp 20  Ht 5\' 5"  (1.651 m)  Wt 194 lb (87.998 kg)  BMI 32.28 kg/m2  LMP 06/03/2013 (Exact Date) or  Filed Vitals:   03/18/14 0530 03/18/14 0600 03/18/14 0630 03/18/14 0700  BP: 101/58 120/70 108/67 120/72  Pulse: 71 87 72 86  Temp:    98 F (36.7 C)  TempSrc:    Oral  Resp: 18   20  Height:      Weight:       5 minute deceleration down to 50s, with slow return to baselie, good variability, +accels afterwards  Dilation: 4.5 Effacement (%): 80 Cervical Position: Anterior Station: -2 Presentation: Vertex Exam by:: [name]  Labs: Lab Results  Component Value Date   WBC 5.2 03/17/2014   HGB 11.4* 03/17/2014   HCT 34.6* 03/17/2014   MCV 99.1 03/17/2014   PLT 184 03/17/2014    Patient Active Problem List   Diagnosis Date Noted  . Post-dates pregnancy 03/17/2014  . No prenatal care in current pregnancy in third trimester   . [redacted] weeks gestation of pregnancy   . Encounter for fetal anatomic survey   . GBS bacteriuria 01/19/2014  . Late prenatal care affecting pregnancy in third trimester, antepartum 01/17/2014  . Supervision of normal first pregnancy in third trimester 01/17/2014    Assessment / Plan: 24 y.o. G1P0 at 6656w1d here for IOL 2/2 post dates  Labor: pit on x 10-5915min, stop pit, O2, position changes, restart in 30min Fetal Wellbeing:  Cat II Anticipated MOD:  vaginal  Brooklynne Pereida ROCIO, MD 03/18/2014, 7:43 AM

## 2014-03-18 NOTE — Progress Notes (Signed)
Aanya Chacko is a 24 y.o. G1P0 at 1211w1d   Subjective: Not really feeling ctx; got relief from H/A pain w/ Stadol dose this morning, but now it is back somewhat  Objective: BP 127/72 mmHg  Pulse 90  Temp(Src) 98.6 F (37 C) (Oral)  Resp 18  Ht 5\' 5"  (1.651 m)  Wt 87.998 kg (194 lb)  BMI 32.28 kg/m2  LMP 06/03/2013 (Exact Date)      FHT:  FHR: 120-130s bpm, variability: moderate,  accelerations:  Present,  decelerations:  Absent UC:   regular, every 1-3 minutes w/ Pitocin @ 1518mu/min SVE:   Dilation: 5 Effacement (%): 80 Station: -2 Exam by:: Winifred Bodiford, cnm- AROM for clear fluid  Labs: Lab Results  Component Value Date   WBC 5.2 03/17/2014   HGB 11.4* 03/17/2014   HCT 34.6* 03/17/2014   MCV 99.1 03/17/2014   PLT 184 03/17/2014    Assessment / Plan: IOL process for postdates Early active labor H/A  Will give another dose of Stadol Hopeful that ROM will lead to increased labor  Infant Doane 03/18/2014, 2:35 PM

## 2014-03-18 NOTE — Progress Notes (Signed)
Morgan Mathews is a 24 y.o. G1P0 at 5734w1d admitted for induction of labor due to Post dates. Due date 03/10/14.  Subjective: Return of left sided headache  Objective: BP 103/62 mmHg  Pulse 75  Temp(Src) 98.5 F (36.9 C) (Oral)  Resp 18  Ht 5\' 5"  (1.651 m)  Wt 194 lb (87.998 kg)  BMI 32.28 kg/m2  LMP 06/03/2013 (Exact Date)      FHT:  FHR: 125 bpm, variability: moderate,  accelerations:  Present,  decelerations:  Absent UC:   regular, every 2-4 minutes SVE:   Dilation: 4.5 Effacement (%): 80 Station: -2 Exam by:: Freescale SemiconductorVeronica Mensah  Labs: Lab Results  Component Value Date   WBC 5.2 03/17/2014   HGB 11.4* 03/17/2014   HCT 34.6* 03/17/2014   MCV 99.1 03/17/2014   PLT 184 03/17/2014    Assessment / Plan: Induction of labor due to postterm,  progressing well on pitocin  Labor: Progressing on Pitocin, will continue to increase then AROM Preeclampsia:  no signs or symptoms of toxicity Fetal Wellbeing:  Category I Pain Control:  Got percocet for headache I/D:  pcn Anticipated MOD:  NSVD  Beverely Lowdamo, Trace Cederberg 03/18/2014, 5:48 AM

## 2014-03-18 NOTE — Progress Notes (Signed)
Morgan Mathews is a 24 y.o. G1P0 at 6773w1d admitted for induction of labor due to Post dates. Due date 03/11/2014.  Subjective: Not feeling contractions.  Objective: BP 108/59 mmHg  Pulse 76  Temp(Src) 98.5 F (36.9 C) (Oral)  Resp 18  Ht 5\' 5"  (1.651 m)  Wt 87.998 kg (194 lb)  BMI 32.28 kg/m2  LMP 06/03/2013 (Exact Date)    FHT:  FHR: 120 bpm, variability: moderate,  accelerations:  Present,  decelerations:  Absent UC:   Irregular, mild  SVE:   Dilation: 4.5 Effacement (%): 80 Station: -2 Exam by:: Morgan Mathews  Pitocin @ 14 mu/min  Labs: Lab Results  Component Value Date   WBC 5.2 03/17/2014   HGB 11.4* 03/17/2014   HCT 34.6* 03/17/2014   MCV 99.1 03/17/2014   PLT 184 03/17/2014    Assessment / Plan: Induction of labor due to post dates.  Labor: Not in labor yet, continue to increase pitocin. Fetal Wellbeing:  Category I Pain Control:  plans epidural in labor Pre-eclampsia: no s/s I/D:  GBS positive Anticipated MOD:  NSVD  [name] SNM 03/18/2014, 1:07 PM

## 2014-03-19 ENCOUNTER — Encounter (HOSPITAL_COMMUNITY): Payer: Self-pay

## 2014-03-19 DIAGNOSIS — Z3A41 41 weeks gestation of pregnancy: Secondary | ICD-10-CM

## 2014-03-19 DIAGNOSIS — O99824 Streptococcus B carrier state complicating childbirth: Secondary | ICD-10-CM

## 2014-03-19 LAB — CBC
HCT: 28.7 % — ABNORMAL LOW (ref 36.0–46.0)
Hemoglobin: 9.9 g/dL — ABNORMAL LOW (ref 12.0–15.0)
MCH: 33.8 pg (ref 26.0–34.0)
MCHC: 34.5 g/dL (ref 30.0–36.0)
MCV: 98 fL (ref 78.0–100.0)
Platelets: 184 10*3/uL (ref 150–400)
RBC: 2.93 MIL/uL — ABNORMAL LOW (ref 3.87–5.11)
RDW: 13.5 % (ref 11.5–15.5)
WBC: 20.8 10*3/uL — ABNORMAL HIGH (ref 4.0–10.5)

## 2014-03-19 MED ORDER — WITCH HAZEL-GLYCERIN EX PADS
1.0000 "application " | MEDICATED_PAD | CUTANEOUS | Status: DC | PRN
  Administered 2014-03-21: 1 via TOPICAL

## 2014-03-19 MED ORDER — ONDANSETRON HCL 4 MG PO TABS: 4.0000 mg | ORAL_TABLET | ORAL | Status: DC | PRN

## 2014-03-19 MED ORDER — PRENATAL MULTIVITAMIN CH
1.0000 | ORAL_TABLET | Freq: Every day | ORAL | Status: DC
  Administered 2014-03-19 – 2014-03-21 (×3): 1 via ORAL
  Filled 2014-03-19 (×3): qty 1

## 2014-03-19 MED ORDER — LANOLIN HYDROUS EX OINT: TOPICAL_OINTMENT | CUTANEOUS | Status: DC | PRN

## 2014-03-19 MED ORDER — IBUPROFEN 600 MG PO TABS
600.0000 mg | ORAL_TABLET | Freq: Four times a day (QID) | ORAL | Status: DC
  Administered 2014-03-19 – 2014-03-21 (×10): 600 mg via ORAL
  Filled 2014-03-19 (×10): qty 1

## 2014-03-19 MED ORDER — SIMETHICONE 80 MG PO CHEW: 80.0000 mg | CHEWABLE_TABLET | ORAL | Status: DC | PRN

## 2014-03-19 MED ORDER — OXYCODONE-ACETAMINOPHEN 5-325 MG PO TABS: 2.0000 | ORAL_TABLET | ORAL | Status: DC | PRN

## 2014-03-19 MED ORDER — DIPHENHYDRAMINE HCL 25 MG PO CAPS: 25.0000 mg | ORAL_CAPSULE | Freq: Four times a day (QID) | ORAL | Status: DC | PRN

## 2014-03-19 MED ORDER — ZOLPIDEM TARTRATE 5 MG PO TABS: 5.0000 mg | ORAL_TABLET | Freq: Every evening | ORAL | Status: DC | PRN

## 2014-03-19 MED ORDER — DIBUCAINE 1 % RE OINT: 1.0000 "application " | TOPICAL_OINTMENT | RECTAL | Status: DC | PRN

## 2014-03-19 MED ORDER — TETANUS-DIPHTH-ACELL PERTUSSIS 5-2.5-18.5 LF-MCG/0.5 IM SUSP: 0.5000 mL | Freq: Once | INTRAMUSCULAR | Status: DC

## 2014-03-19 MED ORDER — OXYCODONE-ACETAMINOPHEN 5-325 MG PO TABS
1.0000 | ORAL_TABLET | ORAL | Status: DC | PRN
  Administered 2014-03-19 – 2014-03-20 (×4): 1 via ORAL
  Filled 2014-03-19 (×4): qty 1

## 2014-03-19 MED ORDER — SENNOSIDES-DOCUSATE SODIUM 8.6-50 MG PO TABS
2.0000 | ORAL_TABLET | ORAL | Status: DC
  Administered 2014-03-19 – 2014-03-21 (×2): 2 via ORAL
  Filled 2014-03-19 (×2): qty 2

## 2014-03-19 MED ORDER — ONDANSETRON HCL 4 MG/2ML IJ SOLN
4.0000 mg | INTRAMUSCULAR | Status: DC | PRN
Start: 2014-03-19 — End: 2014-03-21

## 2014-03-19 MED ORDER — BENZOCAINE-MENTHOL 20-0.5 % EX AERO
1.0000 "application " | INHALATION_SPRAY | CUTANEOUS | Status: DC | PRN
  Administered 2014-03-19: 1 via TOPICAL
  Filled 2014-03-19 (×2): qty 56

## 2014-03-19 NOTE — Progress Notes (Signed)
K.Shaw CNM notified of scoring of high postpartum hemorrhage risk after delivery. Orders received to hold on entering postpartum hemorrhage protocol order set at present. Will continue to observe bleeding.
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
## 2014-03-19 NOTE — Lactation Note (Addendum)
This note was copied from the chart of Morgan Mathews. Lactation Consultation Note New mom, LPC [place] 32 weeks from [place], speaks [place], denies Equities tradernterpreter. Communicated well. C/o headache when sat mom up. Stated she has HA prior to preganacy. Mom has long pendulum breast, states Rt. Breast hurts all the time and has for a good while. She told Dr. And Dr. York SpanielSaid normal for preganacy. States it "burns like pepper, and hurt up under her armpit'. Breast soft, hand expression w/no colostrum noted. Mom gave some formula d/t "I don't have milk". Mom didn't say anything to me about not wanting to BF. Told her RN that she only wants to bottle feed and not go to breast. D/t to her having a headache and not feeling well, I'm not sure if its just for this time. She stated to me she was giving formula d/t she didn't have any milk in her breast. Discussed supply and demand. Allowed me to latch baby in laid back position d/t headache.  Mom encouraged to feed baby 8-12 times/24 hours and with feeding cues. Mom encouraged to waken baby for feeds. Mom encouraged to do skin-to-skin.Referred to Baby and Me Book in Breastfeeding section Pg. 22-23 for position options and Proper latch demonstration.WH/LC brochure given w/resources, support groups and LC services. Educated about newborn behavior. I know mom doesn't feel well at this time, FOB not at bedside at this time. Let mom rest. Discussed with RN moms c/o pain in breast and headache. RN gave pain medication and will monitor.  Patient Name: Morgan Amberle Whitford Today's Date: 03/19/2014 Reason for consult: Initial assessment   Maternal Data Has patient been taught Hand Expression?: Yes Does the patient have breastfeeding experience prior to this delivery?: No  Feeding Feeding Type: Breast Fed Nipple Type: Slow - flow Length of feed: 10 min  LATCH Score/Interventions Latch: Grasps breast easily, tongue down, lips flanged, rhythmical sucking. Intervention(s): Skin  to skin;Teach feeding cues;Waking techniques  Audible Swallowing: None Intervention(s): Skin to skin;Hand expression Intervention(s): Alternate breast massage  Type of Nipple: Everted at rest and after stimulation  Comfort (Breast/Nipple): Soft / non-tender     Hold (Positioning): Assistance needed to correctly position infant at breast and maintain latch. Intervention(s): Skin to skin;Position options;Support Pillows;Breastfeeding basics reviewed  LATCH Score: 7  Lactation Tools Discussed/Used     Consult Status Consult Status: Follow-up Date: 03/20/14 Follow-up type: In-patient    Charyl DancerCARVER, Joeline Freer G 03/19/2014, 5:29 PM

## 2014-03-20 NOTE — Lactation Note (Signed)
This note was copied from the chart of Morgan Mathews. Lactation Consultation Note: Mom complaining of lots of pain with nursing on right breast. Reports baby has already nursed for about 10 min on left when I went into room. Assisted with latch on right Mom needed much assist with getting positioned and getting the baby to the breast. Mom making faces and reports nursing is hurting a lot with good latch. Nipple round when baby comes off the breast. Mom complaining that whole breast hurts- though it feels soft all over Has been giving bottles of formula. Encouraged to always BF on both breasts then give formula if baby is still hungry. No questions at present. To call for assist prn  Patient Name: Morgan Mathews ZOXWR'UToday's Date: 03/20/2014 Reason for consult: Follow-up assessment   Maternal Data Formula Feeding for Exclusion: Yes Reason for exclusion: Mother's choice to formula and breast feed on admission Does the patient have breastfeeding experience prior to this delivery?: No  Feeding Feeding Type: Breast Fed Length of feed: 15 min  LATCH Score/Interventions Latch: Grasps breast easily, tongue down, lips flanged, rhythmical sucking.  Audible Swallowing: A few with stimulation  Type of Nipple: Everted at rest and after stimulation  Comfort (Breast/Nipple): Filling, red/small blisters or bruises, mild/mod discomfort  Problem noted: Mild/Moderate discomfort  Hold (Positioning): Full assist, staff holds infant at breast Intervention(s): Breastfeeding basics reviewed;Support Pillows;Skin to skin  LATCH Score: 6  Lactation Tools Discussed/Used     Consult Status Consult Status: Follow-up Date: 03/21/14 Follow-up type: In-patient    [name], [name] 03/20/2014, 2:38 PM

## 2014-03-20 NOTE — Progress Notes (Signed)
Acknowledged order for CSW consult.  Patient was seen by CSW on 03/20/14.

## 2014-03-20 NOTE — Progress Notes (Signed)
Post Partum Day 2 Subjective: no complaints, up ad lib, voiding and tolerating PO  Pt declines interpreter for visit.  Pt reports right breast pain since before pregnancy, worsening in last 2 days.  Pain is generalized throughout entire breast.  She denies pain in left breast.  Objective: Blood pressure 104/58, pulse 95, temperature 98.4 F (36.9 C), temperature source Oral, resp. rate 18, height 5\' 5"  (1.651 m), weight 87.998 kg (194 lb), last menstrual period 06/03/2013, unknown if currently breastfeeding.  Physical Exam:  General: alert, cooperative and no distress Lochia: appropriate Uterine Fundus: firm Incision: n/a DVT Evaluation: No evidence of DVT seen on physical exam. Negative Homan's sign. No cords or calf tenderness. No significant calf/ankle edema.  Breast exam with tenderness palpable generalized in left breast and rope-like masses x 3-4 which are each soft, mobile throughout breast, consistent with fibrocystic breasts.     Recent Labs  03/19/14 0610  HGB 9.9*  HCT 28.7*    Assessment/Plan: Plan for discharge tomorrow and Breastfeeding  Consult Dr Despina HiddenEure, no imaging of breasts at this time, but if pain continues, pt should have breast ultrasound in 6 months or more.   LOS: 3 days   LEFTWICH-KIRBY, LISA 03/20/2014, 8:50 AM

## 2014-03-21 MED ORDER — IBUPROFEN 600 MG PO TABS: 600.0000 mg | ORAL_TABLET | Freq: Four times a day (QID) | ORAL | Status: DC

## 2014-03-21 NOTE — Discharge Instructions (Signed)

## 2014-03-21 NOTE — Lactation Note (Signed)
This note was copied from the chart of Boy Malasha Woodhead. Lactation Consultation Note; Mother stated that her Rt breast become painful one year before she bacame pregnant. She states she feels  a hot pepper feeling inside the breast and burning pain. She states that the Rt breast has always been larger than the Left. She states when she touches breast she feels pain all over the breast. She denies having any pain in Left breast. On exam mother describes #8 pain scale at 9 o'clock on the Rt breast. A firm very tender area was palpated on the outer aspect of the Rt breast. Mother states that when infant latches to the Rt breast the pain is more than a #10. Infant latched to Rt breast for 5-10 mins. Mother described pain scale of 8-10 on nipple and breast while infant feeding. Infant had a wide gape with well flanged lips. Mother was unable to tolerate infant feeding on the Rt breast.  Infant latched onto Left breast in football hold. Infant latched well with wide open mouth , observed good milk transfer. Mother denies only sight discomfort of a #2 on initial latch and soon no pain at all. Mother taught breast compression. Observed infant breastfeeding for 10-15 mins on the left breast. Observed good suckling and audible swallows.  Mother hand expresses easily. She was given a hand pump with a #27 flange. She was later fit with a #30 due to pain when pumping.  Mother was given a plan to pump Right breast if unable to breastfeed on this breast. Advised to pump for 15 mins every 2-3 hours. Discussed S/S of mastitis and treatment plan to prevent severe engorgement. Mothers plan is to breastfeed on the Left breast.  Dr Loreta AveAcosta informed of consult.    Patient Name: Boy Rosezetta Waggoner Today's Date: 03/21/2014     Maternal Data    Feeding Feeding Type: Bottle Fed - Formula Nipple Type: Slow - flow Length of feed: 15 min (per mom)  LATCH Score/Interventions                      Lactation  Tools Discussed/Used     Consult Status      Michel BickersKendrick, Karinna Beadles McCoy 03/21/2014, 11:32 AM

## 2014-03-21 NOTE — Progress Notes (Signed)
Ur chart review completed.  

## 2014-03-21 NOTE — Progress Notes (Signed)
Ms Dragan seen for complaints of right breast tenderness. She states that she has had tenderness for the last 1.5 years. On the superior part of her right breast.She denies palpating breast lumps  Exam  Breast: no bilateral lymphadenopathy, bilateral galatorrhea from both breasts, + 8/10 tendernesson the right breast from 9 oclock to 2 oclock, no palpable masses  A/P PPD 2 from SVD at 41/2, with longstanding right breast pain limiting breast feeding  - Breast pain: Could consider right breast imaging as soon as practically possible given lactational status and increased breast tissue  - Breast feeding: Will encourage pumping from the right breast as tolerable, and feeding from the left. Will supplement with formula as needed   Latana Colin A. Kennon RoundsHaney MD, MS Family Medicine Resident PGY-1 Pager [phone]

## 2014-03-21 NOTE — Discharge Summary (Signed)
Obstetric Discharge Summary Reason for Admission: induction of labor Prenatal Procedures: none Intrapartum Procedures: vacuum and episiotomy 2nd degree Postpartum Procedures: antibiotics Complications-Operative and Postpartum: 2nd degree perineal laceration HEMOGLOBIN  Date Value Ref Range Status  03/19/2014 9.9* 12.0 - 15.0 g/dL Final   HCT  Date Value Ref Range Status  03/19/2014 28.7* 36.0 - 46.0 % Final  Declines interpreter.  Morgan Mathews is a 24 year old G1P1 PPD#2 after VAVD with episiotomy. She is up and moving with mild pain well controlled with ibuprofen. She has urinated but no bowel movements or flatus. She is tolerating PO. She reports generalized right breast pain that has been present throughout her pregnancy but has worsened over past few days. She had a boy, is breast feeding and is undecided on contraception. She is scheduling an outpatient circumcision.   Physical Exam:  General: alert and no distress Lochia: appropriate Uterine Fundus: firm Incision: N/A DVT Evaluation: No evidence of DVT seen on physical exam.  Discharge Diagnoses: Post-date pregnancy  Discharge Information: Date: 03/21/2014 Activity: pelvic rest Diet: routine Medications: Ibuprofen Condition: stable Instructions: refer to practice specific booklet Discharge to: home Follow-up Information    [facility] Schedule an appointment as soon as possible for a visit in 6 weeks.   Contact information:   [address] [place] [id number] [phone]      Newborn Data: Live born female  Birth Weight: 8 lb 12.6 oz (3985 g) APGAR: 4, 8  Home with mother.  Barrett,Stevi M 03/21/2014, 7:49 AM   OB fellow attestation I have seen and examined this patient and agree with above documentation in the student's note.   Morgan Mathews is a 24 y.o. G1P1001 s/p VAVD.   Pain is well controlled.  Plan for birth control is undecided.  Method of Feeding:  breast  PE:  BP 90/46 mmHg  Pulse 81  Temp(Src) 97.9 F (36.6 C) (Oral)  Resp 16  Ht 5\' 5"  (1.651 m)  Wt 194 lb (87.998 kg)  BMI 32.28 kg/m2  LMP 06/03/2013 (Exact Date)  Breastfeeding? Unknown Fundus firm   Recent Labs  03/19/14 0610  HGB 9.9*  HCT 28.7*     Plan: discharge today - postpartum care discussed - will evaluate breast pain at 6 week postpartum visit - f/u clinic in 6 weeks for postpartum visit   William DaltonMcEachern, Whitney Bingaman, MD 1:17 PM

## 2014-03-23 ENCOUNTER — Encounter: Payer: Self-pay | Admitting: Obstetrics & Gynecology

## 2014-04-07 ENCOUNTER — Encounter (HOSPITAL_COMMUNITY): Payer: Self-pay | Admitting: Emergency Medicine

## 2014-04-07 ENCOUNTER — Emergency Department (INDEPENDENT_AMBULATORY_CARE_PROVIDER_SITE_OTHER)
Admission: EM | Admit: 2014-04-07 | Discharge: 2014-04-07 | Disposition: A | Discharge status: Home / Self Care | Payer: No Typology Code available for payment source | Source: Home / Self Care | Attending: Family Medicine | Admitting: Family Medicine

## 2014-04-07 DIAGNOSIS — N61 Inflammatory disorders of breast: Secondary | ICD-10-CM

## 2014-04-07 MED ORDER — CEPHALEXIN 500 MG PO CAPS: 500.0000 mg | ORAL_CAPSULE | Freq: Four times a day (QID) | ORAL | Status: DC

## 2014-04-07 MED ORDER — IBUPROFEN 600 MG PO TABS: 600.0000 mg | ORAL_TABLET | Freq: Three times a day (TID) | ORAL | Status: DC | PRN

## 2014-04-07 NOTE — ED Notes (Signed)
Pt states that she has right sided breast pain

## 2014-04-07 NOTE — ED Provider Notes (Signed)
Morgan Mathews is a 24 y.o. female who presents to Urgent Care today for breast pain. Patient is 2 weeks postpartum from a vaginal delivery. She has right lateral breast pain developing over the last 2 days. The pain is quite significant. She notes subjective fevers and chills. No vomiting or diarrhea. She is currently breast-feeding. She's tried some over-the-counter medications which helped a little.   Past Medical History  Diagnosis Date  . Medical history non-contributory    Past Surgical History  Procedure Laterality Date  . No past surgeries     History  Substance Use Topics  . Smoking status: Never Smoker   . Smokeless tobacco: Never Used  . Alcohol Use: No   ROS as above Medications: Current Facility-Administered Medications  Medication Dose Route Frequency Provider Last Rate Last Dose  . acetaminophen (TYLENOL) tablet 650 mg  650 mg Oral Once Catalina AntiguaPeggy Constant, MD   650 mg at 03/10/14 1418   Current Outpatient Prescriptions  Medication Sig Dispense Refill  . acetaminophen (TYLENOL) 500 MG tablet Take 1 tablet (500 mg total) by mouth every 6 (six) hours as needed. (Patient taking differently: Take 500 mg by mouth every 6 (six) hours as needed for moderate pain. ) 30 tablet 0  . cephALEXin (KEFLEX) 500 MG capsule Take 1 capsule (500 mg total) by mouth 4 (four) times daily. 28 capsule 0  . docusate sodium (COLACE) 100 MG capsule Take 1 capsule (100 mg total) by mouth 2 (two) times daily. 10 capsule 0  . ibuprofen (ADVIL,MOTRIN) 600 MG tablet Take 1 tablet (600 mg total) by mouth every 8 (eight) hours as needed. 30 tablet 0  . Prenatal Multivit-Min-Fe-FA (PRENATAL VITAMINS) 0.8 MG tablet Take 1 tablet by mouth daily. 30 tablet 12   No Known Allergies   Exam:  BP 117/79 mmHg  Pulse 82  Temp(Src) 98.3 F (36.8 C) (Oral)  Resp 14  SpO2 98% Gen: Well NAD HEENT: EOMI,  MMM Lungs: Normal work of breathing. CTABL Heart: RRR no MRG Breasts: Tender indurated area right lateral  breast. expressed from nipples bilaterally. No skin changes on the breasts bilaterally Abd: NABS, Soft. Nondistended, Nontender Exts: Brisk capillary refill, warm and well perfused.   No results found for this or any previous visit (from the past 24 hour(s)). No results found.  Assessment and Plan: 24 y.o. female with lactational mastitis of the right breast. She was continued breast-feeding, warm compress, and Keflex antibiotics. Return if not improving.  Discussed warning signs or symptoms. Please see discharge instructions. Patient expresses understanding.     Rodolph BongEvan S Blayke Cordrey, MD 04/07/14 [phone]

## 2014-04-07 NOTE — Discharge Instructions (Signed)
Thank you for coming in today. Take keflex 4x daily.  Use ibuprofen for pain as needed.  Return as needed.    Breastfeeding and Mastitis Mastitis is inflammation of the breast tissue. It can occur in women who are breastfeeding. This can make breastfeeding painful. Mastitis will sometimes go away on its own. Your health care provider will help determine if treatment is needed. CAUSES Mastitis is often associated with a blocked milk (lactiferous) duct. This can happen when too much milk builds up in the breast. Causes of excess milk in the breast can include:  Poor latch-on. If your baby is not latched onto the breast properly, she or he may not empty your breast completely while breastfeeding.  Allowing too much time to pass between feedings.  Wearing a bra or other clothing that is too tight. This puts extra pressure on the lactiferous ducts so milk does not flow through them as it should. Mastitis can also be caused by a bacterial infection. Bacteria may enter the breast tissue through cuts or openings in the skin. In women who are breastfeeding, this may occur because of cracked or irritated skin. Cracks in the skin are often caused when your baby does not latch on properly to the breast. SIGNS AND SYMPTOMS  Swelling, redness, tenderness, and pain in an area of the breast.  Swelling of the glands under the arm on the same side.  Fever may or may not accompany mastitis. If an infection is allowed to progress, a collection of pus (abscess) may develop. DIAGNOSIS  Your health care provider can usually diagnose mastitis based on your symptoms and a physical exam. Tests may be done to help confirm the diagnosis. These may include:  Removal of pus from the breast by applying pressure to the area. This pus can be examined in the lab to determine which bacteria are present. If an abscess has developed, the fluid in the abscess can be removed with a needle. This can also be used to confirm the  diagnosis and determine the bacteria present. In most cases, pus will not be present.  Blood tests to determine if your body is fighting a bacterial infection.  Mammogram or ultrasound tests to rule out other problems or diseases. TREATMENT  Mastitis that occurs with breastfeeding will sometimes go away on its own. Your health care provider may choose to wait 24 hours after first seeing you to decide whether a prescription medicine is needed. If your symptoms are worse after 24 hours, your health care provider will likely prescribe an antibiotic medicine to treat the mastitis. He or she will determine which bacteria are most likely causing the infection and will then select an appropriate antibiotic medicine. This is sometimes changed based on the results of tests performed to identify the bacteria, or if there is no response to the antibiotic medicine selected. Antibiotic medicines are usually given by mouth. You may also be given medicine for pain. HOME CARE INSTRUCTIONS  Only take over-the-counter or prescription medicines for pain, fever, or discomfort as directed by your health care provider.  If your health care provider prescribed an antibiotic medicine, take the medicine as directed. Make sure you finish it even if you start to feel better.  Do not wear a tight or underwire bra. Wear a soft, supportive bra.  Increase your fluid intake, especially if you have a fever.  Continue to empty the breast. Your health care provider can tell you whether this milk is safe for your infant or  needs to be thrown out. You may be told to stop nursing until your health care provider thinks it is safe for your baby. Use a breast pump if you are advised to stop nursing.  Keep your nipples clean and dry.  Empty the first breast completely before going to the other breast. If your baby is not emptying your breasts completely for some reason, use a breast pump to empty your breasts.  If you go back to work,  pump your breasts while at work to stay in time with your nursing schedule.  Avoid allowing your breasts to become overly filled with milk (engorged). SEEK MEDICAL CARE IF:  You have pus-like discharge from the breast.  Your symptoms do not improve with the treatment prescribed by your health care provider within 2 days. SEEK IMMEDIATE MEDICAL CARE IF:  Your pain and swelling are getting worse.  You have pain that is not controlled with medicine.  You have a red line extending from the breast toward your armpit.  You have a fever or persistent symptoms for more than 2-3 days.  You have a fever and your symptoms suddenly get worse. MAKE SURE YOU:   Understand these instructions.  Will watch your condition.  Will get help right away if you are not doing well or get worse. Document Released: 05/11/2004 Document Revised: 01/19/2013 Document Reviewed: 08/20/2012 [facility] Patient Information 2015 [place], [place]. This information is not intended to replace advice given to you by your health care provider. Make sure you discuss any questions you have with your health care provider.

## 2014-04-14 ENCOUNTER — Ambulatory Visit (INDEPENDENT_AMBULATORY_CARE_PROVIDER_SITE_OTHER): Payer: No Typology Code available for payment source | Admitting: Family Medicine

## 2014-04-14 ENCOUNTER — Encounter: Payer: Self-pay | Admitting: Family Medicine

## 2014-04-14 VITALS — BP 112/69 | HR 70 | Temp 97.4°F | Wt 177.9 lb

## 2014-04-14 DIAGNOSIS — N61 Inflammatory disorders of breast: Secondary | ICD-10-CM

## 2014-04-14 LAB — POCT PREGNANCY, URINE: Preg Test, Ur: NEGATIVE

## 2014-04-14 MED ORDER — NORGESTIMATE-ETH ESTRADIOL 0.25-35 MG-MCG PO TABS: 1.0000 | ORAL_TABLET | Freq: Every day | ORAL | Status: DC

## 2014-04-14 MED ORDER — CLINDAMYCIN HCL 300 MG PO CAPS: 300.0000 mg | ORAL_CAPSULE | Freq: Three times a day (TID) | ORAL | Status: AC

## 2014-04-14 NOTE — Progress Notes (Signed)
[employer] interpreter ID# [id number].  C/o right breast pain.  Desires OCPs for birth control. Denies any sexual intercourse since delivery. UPT obtained.

## 2014-04-14 NOTE — Progress Notes (Signed)
  Subjective:     Morgan Mathews is a 24 y.o. female who presents for a postpartum visit. She is 4 weeks postpartum following a spontaneous vaginal delivery. I have fully reviewed the prenatal and intrapartum course. The delivery was at 41 gestational weeks. Outcome: vacuum, mid. Anesthesia: epidural. Postpartum course has been complicated with mastitis.  [facility] and given a prescription for keflex. Baby's course has been normal. Baby is feeding by breast. Bleeding no bleeding. Bowel function is normal. Bladder function is normal. Patient is not sexually active. Contraception method is OCP (estrogen/progesterone). Postpartum depression screening: negative.  The following portions of the patient's history were reviewed and updated as appropriate: allergies, current medications, past family history, past medical history, past social history, past surgical history and problem list.  Review of Systems Pertinent items are noted in HPI.   Objective:    BP 112/69 mmHg  Pulse 70  Temp(Src) 97.4 F (36.3 C)  Wt 177 lb 14.4 oz (80.695 kg)  Breastfeeding? Yes  General:  alert, cooperative and no distress   Breasts:  redness to skin, tenderness to right breast.    Lungs: clear to auscultation bilaterally  Heart:  regular rate and rhythm, S1, S2 normal, no murmur, click, rub or gallop  Abdomen: soft, non-tender; bowel sounds normal; no masses,  no organomegaly   Vulva:  normal  Vagina: normal vagina        Assessment:     Normal postpartum exam. Mastitis.  Pap smear not done at today's visit.   Plan:    1. Contraception: OCP (estrogen/progesterone) 2. Clindamycin 3. Follow up in: 1 year or as needed.

## 2014-04-14 NOTE — Patient Instructions (Signed)
Breastfeeding and Mastitis °Mastitis is inflammation of the breast tissue. It can occur in women who are breastfeeding. This can make breastfeeding painful. Mastitis will sometimes go away on its own. Your health care provider will help determine if treatment is needed. °CAUSES °Mastitis is often associated with a blocked milk (lactiferous) duct. This can happen when too much milk builds up in the breast. Causes of excess milk in the breast can include: °· Poor latch-on. If your baby is not latched onto the breast properly, she or he may not empty your breast completely while breastfeeding. °· Allowing too much time to pass between feedings. °· Wearing a bra or other clothing that is too tight. This puts extra pressure on the lactiferous ducts so milk does not flow through them as it should. °Mastitis can also be caused by a bacterial infection. Bacteria may enter the breast tissue through cuts or openings in the skin. In women who are breastfeeding, this may occur because of cracked or irritated skin. Cracks in the skin are often caused when your baby does not latch on properly to the breast. °SIGNS AND SYMPTOMS °· Swelling, redness, tenderness, and pain in an area of the breast. °· Swelling of the glands under the arm on the same side. °· Fever may or may not accompany mastitis. °If an infection is allowed to progress, a collection of pus (abscess) may develop. °DIAGNOSIS  °Your health care provider can usually diagnose mastitis based on your symptoms and a physical exam. Tests may be done to help confirm the diagnosis. These may include: °· Removal of pus from the breast by applying pressure to the area. This pus can be examined in the lab to determine which bacteria are present. If an abscess has developed, the fluid in the abscess can be removed with a needle. This can also be used to confirm the diagnosis and determine the bacteria present. In most cases, pus will not be present. °· Blood tests to determine if  your body is fighting a bacterial infection. °· Mammogram or ultrasound tests to rule out other problems or diseases. °TREATMENT  °Mastitis that occurs with breastfeeding will sometimes go away on its own. Your health care provider may choose to wait 24 hours after first seeing you to decide whether a prescription medicine is needed. If your symptoms are worse after 24 hours, your health care provider will likely prescribe an antibiotic medicine to treat the mastitis. He or she will determine which bacteria are most likely causing the infection and will then select an appropriate antibiotic medicine. This is sometimes changed based on the results of tests performed to identify the bacteria, or if there is no response to the antibiotic medicine selected. Antibiotic medicines are usually given by mouth. You may also be given medicine for pain. °HOME CARE INSTRUCTIONS °· Only take over-the-counter or prescription medicines for pain, fever, or discomfort as directed by your health care provider. °· If your health care provider prescribed an antibiotic medicine, take the medicine as directed. Make sure you finish it even if you start to feel better. °· Do not wear a tight or underwire bra. Wear a soft, supportive bra. °· Increase your fluid intake, especially if you have a fever. °· Continue to empty the breast. Your health care provider can tell you whether this milk is safe for your infant or needs to be thrown out. You may be told to stop nursing until your health care provider thinks it is safe for your baby.   Use a breast pump if you are advised to stop nursing. °· Keep your nipples clean and dry. °· Empty the first breast completely before going to the other breast. If your baby is not emptying your breasts completely for some reason, use a breast pump to empty your breasts. °· If you go back to work, pump your breasts while at work to stay in time with your nursing schedule. °· Avoid allowing your breasts to become  overly filled with milk (engorged). °SEEK MEDICAL CARE IF: °· You have pus-like discharge from the breast. °· Your symptoms do not improve with the treatment prescribed by your health care provider within 2 days. °SEEK IMMEDIATE MEDICAL CARE IF: °· Your pain and swelling are getting worse. °· You have pain that is not controlled with medicine. °· You have a red line extending from the breast toward your armpit. °· You have a fever or persistent symptoms for more than 2-3 days. °· You have a fever and your symptoms suddenly get worse. °MAKE SURE YOU:  °· Understand these instructions. °· Will watch your condition. °· Will get help right away if you are not doing well or get worse. °Document Released: 05/11/2004 Document Revised: 01/19/2013 Document Reviewed: 08/20/2012 °[employer]® Patient Information ©2015 [employer]. This information is not intended to replace advice given to you by your health care provider. Make sure you discuss any questions you have with your health care provider. ° °

## 2014-05-10 ENCOUNTER — Ambulatory Visit (INDEPENDENT_AMBULATORY_CARE_PROVIDER_SITE_OTHER): Payer: No Typology Code available for payment source | Admitting: Family Medicine

## 2014-05-10 VITALS — BP 108/58 | HR 72 | Temp 98.2°F | Resp 16 | Ht 65.0 in | Wt 187.0 lb

## 2014-05-10 DIAGNOSIS — Z789 Other specified health status: Secondary | ICD-10-CM

## 2014-05-10 DIAGNOSIS — N644 Mastodynia: Secondary | ICD-10-CM

## 2014-05-10 DIAGNOSIS — N6019 Diffuse cystic mastopathy of unspecified breast: Secondary | ICD-10-CM | POA: Insufficient documentation

## 2014-05-10 DIAGNOSIS — N6011 Diffuse cystic mastopathy of right breast: Secondary | ICD-10-CM

## 2014-05-10 LAB — CBC WITH DIFFERENTIAL/PLATELET
Basophils Absolute: 0 K/uL (ref 0.0–0.1)
Basophils Relative: 0 % (ref 0–1)
Eosinophils Absolute: 0.1 K/uL (ref 0.0–0.7)
Eosinophils Relative: 2 % (ref 0–5)
HCT: 33.1 % — ABNORMAL LOW (ref 36.0–46.0)
Hemoglobin: 11.2 g/dL — ABNORMAL LOW (ref 12.0–15.0)
Lymphocytes Relative: 43 % (ref 12–46)
Lymphs Abs: 1.9 K/uL (ref 0.7–4.0)
MCH: 30.9 pg (ref 26.0–34.0)
MCHC: 33.8 g/dL (ref 30.0–36.0)
MCV: 91.4 fL (ref 78.0–100.0)
MPV: 11.3 fL (ref 8.6–12.4)
Monocytes Absolute: 0.4 K/uL (ref 0.1–1.0)
Monocytes Relative: 8 % (ref 3–12)
Neutro Abs: 2.1 K/uL (ref 1.7–7.7)
Neutrophils Relative %: 47 % (ref 43–77)
Platelets: 199 K/uL (ref 150–400)
RBC: 3.62 MIL/uL — ABNORMAL LOW (ref 3.87–5.11)
RDW: 14 % (ref 11.5–15.5)
WBC: 4.4 K/uL (ref 4.0–10.5)

## 2014-05-10 NOTE — Patient Instructions (Signed)
Breastfeeding and Mastitis °Mastitis is inflammation of the breast tissue. It can occur in women who are breastfeeding. This can make breastfeeding painful. Mastitis will sometimes go away on its own. Your health care provider will help determine if treatment is needed. °CAUSES °Mastitis is often associated with a blocked milk (lactiferous) duct. This can happen when too much milk builds up in the breast. Causes of excess milk in the breast can include: °· Poor latch-on. If your baby is not latched onto the breast properly, she or he may not empty your breast completely while breastfeeding. °· Allowing too much time to pass between feedings. °· Wearing a bra or other clothing that is too tight. This puts extra pressure on the lactiferous ducts so milk does not flow through them as it should. °Mastitis can also be caused by a bacterial infection. Bacteria may enter the breast tissue through cuts or openings in the skin. In women who are breastfeeding, this may occur because of cracked or irritated skin. Cracks in the skin are often caused when your baby does not latch on properly to the breast. °SIGNS AND SYMPTOMS °· Swelling, redness, tenderness, and pain in an area of the breast. °· Swelling of the glands under the arm on the same side. °· Fever may or may not accompany mastitis. °If an infection is allowed to progress, a collection of pus (abscess) may develop. °DIAGNOSIS  °Your health care provider can usually diagnose mastitis based on your symptoms and a physical exam. Tests may be done to help confirm the diagnosis. These may include: °· Removal of pus from the breast by applying pressure to the area. This pus can be examined in the lab to determine which bacteria are present. If an abscess has developed, the fluid in the abscess can be removed with a needle. This can also be used to confirm the diagnosis and determine the bacteria present. In most cases, pus will not be present. °· Blood tests to determine if  your body is fighting a bacterial infection. °· Mammogram or ultrasound tests to rule out other problems or diseases. °TREATMENT  °Mastitis that occurs with breastfeeding will sometimes go away on its own. Your health care provider may choose to wait 24 hours after first seeing you to decide whether a prescription medicine is needed. If your symptoms are worse after 24 hours, your health care provider will likely prescribe an antibiotic medicine to treat the mastitis. He or she will determine which bacteria are most likely causing the infection and will then select an appropriate antibiotic medicine. This is sometimes changed based on the results of tests performed to identify the bacteria, or if there is no response to the antibiotic medicine selected. Antibiotic medicines are usually given by mouth. You may also be given medicine for pain. °HOME CARE INSTRUCTIONS °· Only take over-the-counter or prescription medicines for pain, fever, or discomfort as directed by your health care provider. °· If your health care provider prescribed an antibiotic medicine, take the medicine as directed. Make sure you finish it even if you start to feel better. °· Do not wear a tight or underwire bra. Wear a soft, supportive bra. °· Increase your fluid intake, especially if you have a fever. °· Continue to empty the breast. Your health care provider can tell you whether this milk is safe for your infant or needs to be thrown out. You may be told to stop nursing until your health care provider thinks it is safe for your baby.   Use a breast pump if you are advised to stop nursing. °· Keep your nipples clean and dry. °· Empty the first breast completely before going to the other breast. If your baby is not emptying your breasts completely for some reason, use a breast pump to empty your breasts. °· If you go back to work, pump your breasts while at work to stay in time with your nursing schedule. °· Avoid allowing your breasts to become  overly filled with milk (engorged). °SEEK MEDICAL CARE IF: °· You have pus-like discharge from the breast. °· Your symptoms do not improve with the treatment prescribed by your health care provider within 2 days. °SEEK IMMEDIATE MEDICAL CARE IF: °· Your pain and swelling are getting worse. °· You have pain that is not controlled with medicine. °· You have a red line extending from the breast toward your armpit. °· You have a fever or persistent symptoms for more than 2-3 days. °· You have a fever and your symptoms suddenly get worse. °MAKE SURE YOU:  °· Understand these instructions. °· Will watch your condition. °· Will get help right away if you are not doing well or get worse. °Document Released: 05/11/2004 Document Revised: 01/19/2013 Document Reviewed: 08/20/2012 °[employer]® Patient Information ©2015 [employer]. This information is not intended to replace advice given to you by your health care provider. Make sure you discuss any questions you have with your health care provider. ° °

## 2014-05-10 NOTE — Progress Notes (Signed)
Subjective:    Patient ID: Morgan Mathews, female    DOB: 01/11/1991, 24 y.o.   MRN: [id number]  HPI Morgan Mathews is a 24 year old female that presents to establish care. Patient recently relocated from [place] primarily speaks [place]i. She states that she has been under the care of an obstetrician prior to the delivery of her son 1 month ago. Patient presents for evaluation of a breast pain and mass. Change was noted several years ago. Morgan Mathews is currently breastfeeding. Patient does not routinely do self breast exams.  Patient has noted a change on breast exam.   A Last menstrual period was prior to pregnancy.   Age of first live birth was at 58. Patient denies a personal history of breast cancer.Patient states that she has been having breast pain for 3 years. 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  Past Medical History  Diagnosis Date  . Medical history non-contributory    History   Social History  . Marital Status: Married    Spouse Name: N/A  . Number of Children: N/A  . Years of Education: N/A   Occupational History  . Not on file.   Social History Main Topics  . Smoking status: Never Smoker   . Smokeless tobacco: Never Used  . Alcohol Use: No  . Drug Use: No  . Sexual Activity: Yes    Birth Control/ Protection: None   Other Topics Concern  . Not on file   Social History Narrative    Review of Systems  Constitutional: Negative.  Negative for fever, chills and fatigue.  HENT: Negative.   Eyes: Negative.   Respiratory: Negative.   Cardiovascular: Negative.   Gastrointestinal: Negative.   Endocrine: Negative.  Negative for cold intolerance, heat intolerance, polydipsia, polyphagia and polyuria.  Genitourinary: Negative.   Musculoskeletal: Negative.   Skin: Negative.        Right breast pain  Allergic/Immunologic: Negative.  Negative for immunocompromised state.  Neurological: Negative.   Hematological: Negative.    Psychiatric/Behavioral: Negative.  Negative for suicidal ideas and sleep disturbance.       Objective:   Physical Exam  Constitutional: She is oriented to person, place, and time. She appears well-developed and well-nourished.  HENT:  Head: Normocephalic and atraumatic.  Right Ear: External ear normal.  Left Ear: External ear normal.  Eyes: Conjunctivae and EOM are normal. Pupils are equal, round, and reactive to light.  Neck: Normal range of motion. Neck supple.  Cardiovascular: Normal rate, regular rhythm, normal heart sounds and intact distal pulses.   Pulmonary/Chest: Effort normal and breath sounds normal. Right breast exhibits tenderness. Right breast exhibits no inverted nipple and no mass (induration). Left breast exhibits no inverted nipple, no mass, no nipple discharge, no skin change and no tenderness. Breasts are asymmetrical (right appears larger than left, tender to touch).  Abdominal: Soft. Bowel sounds are normal.  Musculoskeletal: Normal range of motion.  Neurological: She is alert and oriented to person, place, and time. She has normal reflexes.  Skin: Skin is warm and dry. There is erythema.  Psychiatric: She has a normal mood and affect. Her behavior is normal. Judgment and thought content normal.         BP 108/58 mmHg  Pulse 72  Temp(Src) 98.2 F (36.8 C) (Oral)  Resp 16  Ht  (1.651 m)  Wt 187 lb (84.823 kg)  BMI 31.12 kg/m2 Assessment & Plan:  1. Mastitis  chronic, right Morgan Mathews currently has breast tenderness, swelling and warmth to right breast and axillary tenderness. She states that she has been applying warm compresses with minimal relief. She states that right breast pain was present prior to breast feeding and has been present for close to 3 years. She states hat she has been to the emergency room on several occasions for right breast pain. She recently completed a course of Keflex with minimal relief. I suspect that Morgan Mathews has chronic  mastitis, will check right breast ultrasound to rule out breast abscess. Instructed Morgan Mathews to continue breast feeding utilizing her left breast primarily.   - CBC with Differential  2. Breast pain, right - CBC with Differential - US BREAST LTD UNI RIGHT INC AXILLA; Future - acetaminophen (TYLENOL) 500 MG tablet; Take 1 tablet (500 mg total) by mouth every 6 (six) hours as needed.  Dispense: 30 tablet; Refill: 0   3. Language barrier to communication Utilized language line to assist with communication. Patient primarily speaks Twi, but understands english.    RTC: Will schedule follow up appointment following ultrasound  Massie MaroonHollis,Mercades Bajaj M, FNP

## 2014-05-11 ENCOUNTER — Encounter: Payer: Self-pay | Admitting: Family Medicine

## 2014-05-11 DIAGNOSIS — Z789 Other specified health status: Secondary | ICD-10-CM | POA: Insufficient documentation

## 2014-05-11 MED ORDER — ACETAMINOPHEN 500 MG PO TABS: 500.0000 mg | ORAL_TABLET | Freq: Four times a day (QID) | ORAL | Status: DC | PRN

## 2014-05-13 ENCOUNTER — Encounter (INDEPENDENT_AMBULATORY_CARE_PROVIDER_SITE_OTHER): Payer: Self-pay

## 2014-05-13 ENCOUNTER — Ambulatory Visit
Admission: RE | Admit: 2014-05-13 | Discharge: 2014-05-13 | Disposition: A | Discharge status: Home / Self Care | Payer: No Typology Code available for payment source | Source: Ambulatory Visit | Attending: Family Medicine | Admitting: Family Medicine

## 2014-05-13 DIAGNOSIS — N644 Mastodynia: Secondary | ICD-10-CM

## 2014-08-16 ENCOUNTER — Ambulatory Visit: Payer: No Typology Code available for payment source | Admitting: Family Medicine

## 2014-08-22 ENCOUNTER — Ambulatory Visit (INDEPENDENT_AMBULATORY_CARE_PROVIDER_SITE_OTHER): Payer: No Typology Code available for payment source | Admitting: Family Medicine

## 2014-08-22 VITALS — BP 113/63 | HR 73 | Temp 98.2°F | Resp 14 | Ht 65.0 in | Wt 195.0 lb

## 2014-08-22 DIAGNOSIS — Z789 Other specified health status: Secondary | ICD-10-CM

## 2014-08-22 DIAGNOSIS — N644 Mastodynia: Secondary | ICD-10-CM

## 2014-08-22 NOTE — Patient Instructions (Signed)
There is no apparent reason for the breast pain. There is no sign of infections and Ultrasound was normal. I would recommend and good supportive brassiere and ibuprofen as needed. If symptoms worsen, please follow-up.

## 2014-08-22 NOTE — Progress Notes (Signed)
Subjective:     Patient ID: Morgan Mathews, female   DOB: May 06, 1990, 24 y.o.   MRN: [id number]  HPI   Patient presents today for continued breast pain. She has been having this pain for a number of years even before her pregnancy. She has a child about 41 months old and is still breast feeding. She reports no drainage other than mild from the nipple. She was here in April was treated for presumed mastitis with an antibiotic without relief. She had a normal ultrasound of the breast at that time. It was recommended that she wear a good supportive bra but has not done so.   Review of Systems   Denies fever, chills, weight loss, loss of appetite Denies cough, shortness of breath Denies chest pain, palpitations Denies adb pain, nausea, vomiting, diarrhea     Objective:   Physical Exam   Alert, oriented, appropriate, in no acute distress Skin is warm and dry Neck is supple FROM w/o adenopathy or tenderness. Lungs are clear to auscultation HS are regular The right breast is large, soft, without nipple discharge. There is a generalized tenderness of the entire right breast.      Assessment Plan     Breast tendernss   - I have explained that I find no reason for the breast pain. -I have recommended once again and good supportive bra and the use of ibuprofen -If symptoms worsens, she should follow-up  Language barrier to care -We have used an interpreter today.    Henrietta Hoover, FNP-BC

## 2014-10-20 ENCOUNTER — Emergency Department (HOSPITAL_COMMUNITY)
Admission: EM | Admit: 2014-10-20 | Discharge: 2014-10-20 | Disposition: A | Discharge status: Home / Self Care | Payer: Self-pay | Attending: Emergency Medicine | Admitting: Emergency Medicine

## 2014-10-20 DIAGNOSIS — J0110 Acute frontal sinusitis, unspecified: Secondary | ICD-10-CM | POA: Insufficient documentation

## 2014-10-20 DIAGNOSIS — R55 Syncope and collapse: Secondary | ICD-10-CM | POA: Insufficient documentation

## 2014-10-20 DIAGNOSIS — Z3202 Encounter for pregnancy test, result negative: Secondary | ICD-10-CM | POA: Insufficient documentation

## 2014-10-20 DIAGNOSIS — R112 Nausea with vomiting, unspecified: Secondary | ICD-10-CM | POA: Insufficient documentation

## 2014-10-20 DIAGNOSIS — Z79899 Other long term (current) drug therapy: Secondary | ICD-10-CM | POA: Insufficient documentation

## 2014-10-20 LAB — I-STAT CHEM 8, ED
BUN: 15 mg/dL (ref 6–20)
Calcium, Ion: 1.14 mmol/L (ref 1.12–1.23)
Chloride: 107 mmol/L (ref 101–111)
Creatinine, Ser: 0.6 mg/dL (ref 0.44–1.00)
Glucose, Bld: 90 mg/dL (ref 65–99)
HCT: 42 % (ref 36.0–46.0)
Hemoglobin: 14.3 g/dL (ref 12.0–15.0)
Potassium: 4 mmol/L (ref 3.5–5.1)
Sodium: 139 mmol/L (ref 135–145)
TCO2: 22 mmol/L (ref 0–100)

## 2014-10-20 LAB — URINALYSIS, ROUTINE W REFLEX MICROSCOPIC
Bilirubin Urine: NEGATIVE
Glucose, UA: NEGATIVE mg/dL
Hgb urine dipstick: NEGATIVE
Ketones, ur: NEGATIVE mg/dL
Leukocytes, UA: NEGATIVE
Nitrite: NEGATIVE
Protein, ur: NEGATIVE mg/dL
Specific Gravity, Urine: 1.027 (ref 1.005–1.030)
Urobilinogen, UA: 1 mg/dL (ref 0.0–1.0)
pH: 7.5 (ref 5.0–8.0)

## 2014-10-20 LAB — POC URINE PREG, ED: PREG TEST UR: NEGATIVE

## 2014-10-20 MED ORDER — ONDANSETRON 4 MG PO TBDP
4.0000 mg | ORAL_TABLET | Freq: Once | ORAL | Status: AC
  Administered 2014-10-20: 4 mg via ORAL
  Filled 2014-10-20: qty 1

## 2014-10-20 MED ORDER — AMOXICILLIN-POT CLAVULANATE 875-125 MG PO TABS: 1.0000 | ORAL_TABLET | Freq: Two times a day (BID) | ORAL | Status: DC

## 2014-10-20 MED ORDER — ACETAMINOPHEN 500 MG PO TABS
1000.0000 mg | ORAL_TABLET | Freq: Once | ORAL | Status: AC
  Administered 2014-10-20: 1000 mg via ORAL
  Filled 2014-10-20: qty 2

## 2014-10-20 MED ORDER — IBUPROFEN 800 MG PO TABS
800.0000 mg | ORAL_TABLET | Freq: Once | ORAL | Status: AC
  Administered 2014-10-20: 800 mg via ORAL
  Filled 2014-10-20: qty 1

## 2014-10-20 MED ORDER — ONDANSETRON 4 MG PO TBDP: ORAL_TABLET | ORAL | Status: DC

## 2014-10-20 MED ORDER — SODIUM CHLORIDE 0.9 % IV BOLUS (SEPSIS)
1000.0000 mL | Freq: Once | INTRAVENOUS | Status: AC
  Administered 2014-10-20: 1000 mL via INTRAVENOUS

## 2014-10-20 NOTE — Progress Notes (Signed)
Patient listed as not having a pcp or insurance living in [facility].  EDCM spoke to patient at bedside.  Patient reports she has the orange card.  [facility] informed patient that her pcp is listed on the bottom right corner of her orange card and encouraged patient to make a follow up appointment.  No further EDCM needs at this time.

## 2014-10-20 NOTE — ED Notes (Addendum)
Patient was with her [facility]. She has been nauseated recently and was vomiting in the bathroom there and had a syncopal episode. She was found on the floor when EMS arrived. Patient was not being treated today although she has a history of sickle cell anemia. Patient just states she has not been feeling well.

## 2014-10-20 NOTE — Discharge Instructions (Signed)
Sinusitis °Sinusitis is redness, soreness, and puffiness (inflammation) of the air pockets in the bones of your face (sinuses). The redness, soreness, and puffiness can cause air and mucus to get trapped in your sinuses. This can allow germs to grow and cause an infection.  °HOME CARE  °· Drink enough fluids to keep your pee (urine) clear or pale yellow. °· Use a humidifier in your home. °· Run a hot shower to create steam in the bathroom. Sit in the bathroom with the door closed. Breathe in the steam 3-4 times a day. °· Put a warm, moist washcloth on your face 3-4 times a day, or as told by your doctor. °· Use salt water sprays (saline sprays) to wet the thick fluid in your nose. This can help the sinuses drain. °· Only take medicine as told by your doctor. °GET HELP RIGHT AWAY IF:  °· Your pain gets worse. °· You have very bad headaches. °· You are sick to your stomach (nauseous). °· You throw up (vomit). °· You are very sleepy (drowsy) all the time. °· Your face is puffy (swollen). °· Your vision changes. °· You have a stiff neck. °· You have trouble breathing. °MAKE SURE YOU:  °· Understand these instructions. °· Will watch your condition. °· Will get help right away if you are not doing well or get worse. °Document Released: 07/03/2007 Document Revised: 10/09/2011 Document Reviewed: 08/20/2011 °Exit[employer]® Patient Information ©2015 [employer]. This information is not intended to replace advice given to you by your health care provider. Make sure you discuss any questions you have with your health care provider. ° °

## 2014-10-20 NOTE — ED Notes (Signed)
Bed: XB14 Expected date:  Expected time:  Means of arrival:  Comments: EMS- n/v, syncope

## 2014-10-20 NOTE — ED Provider Notes (Signed)
CSN: [id number]     Arrival date & time 10/20/14  1527 History   First MD Initiated Contact with Patient 10/20/14 1531     Chief Complaint  Patient presents with  . Loss of Consciousness  . Nausea     (Consider location/radiation/quality/duration/timing/severity/associated sxs/prior Treatment) Patient is a 24 y.o. female presenting with syncope. The history is provided by the patient.  Loss of Consciousness Episode history:  Single Most recent episode:  Today Duration:  2 seconds Timing:  Constant Progression:  Resolved Chronicity:  New Context comment:  Vomiting Witnessed: yes   Relieved by:  Lying down Worsened by:  Nothing tried Ineffective treatments:  None tried Associated symptoms: fever and headaches   Associated symptoms: no chest pain, no dizziness, no nausea, no palpitations, no shortness of breath and no vomiting     24 yo F with a chief complaint of syncope. Patient states that since last night she has not felt well. Headache nausea. Denies abdominal pain denies vomiting denies dysuria. Patient gave birth about 7 months ago. Currently breast-feeding. Had no other issues. Denies neck pain. One episode of syncope was after one episode of emesis. Past Medical History  Diagnosis Date  . Medical history non-contributory    Past Surgical History  Procedure Laterality Date  . No past surgeries     No family history on file. Social History  Substance Use Topics  . Smoking status: Never Smoker   . Smokeless tobacco: Never Used  . Alcohol Use: No   OB History    Gravida Para Term Preterm AB TAB SAB Ectopic Multiple Living   0 1     Review of Systems  Constitutional: Positive for fever. Negative for chills.  HENT: Negative for congestion and rhinorrhea.   Eyes: Negative for redness and visual disturbance.  Respiratory: Negative for shortness of breath and wheezing.   Cardiovascular: Positive for syncope. Negative for chest pain and palpitations.   Gastrointestinal: Negative for nausea and vomiting.  Genitourinary: Negative for dysuria and urgency.  Musculoskeletal: Negative for myalgias and arthralgias.  Skin: Negative for pallor and wound.  Neurological: Positive for syncope and headaches. Negative for dizziness.      Allergies  Review of patient's allergies indicates no known allergies.  Home Medications   Prior to Admission medications   Medication Sig Start Date End Date Taking? Authorizing Provider  acetaminophen (TYLENOL) 500 MG tablet Take 1 tablet (500 mg total) by mouth every 6 (six) hours as needed. Patient not taking: Reported on 08/22/2014 05/11/14   Massie Maroon, FNP  amoxicillin-clavulanate (AUGMENTIN) 875-125 MG per tablet Take 1 tablet by mouth 2 (two) times daily. One po bid x 7 days 10/20/14   Melene Plan, DO  norgestimate-ethinyl estradiol (ORTHO-CYCLEN,SPRINTEC,PREVIFEM) 0.25-35 MG-MCG tablet Take 1 tablet by mouth daily. Patient not taking: Reported on 05/10/2014 04/14/14   Rhona Raider Stinson, DO  ondansetron (ZOFRAN ODT) 4 MG disintegrating tablet  ODT q4 hours prn nausea/vomit 10/20/14   Melene Plan, DO   BP 100/48 mmHg  Pulse 93  Temp(Src) 98.7 F (37.1 C) (Oral)  Resp 19  SpO2 97%  LMP 03/21/2014  Breastfeeding? Yes Physical Exam  Constitutional: She is oriented to person, place, and time. She appears well-developed and well-nourished. No distress.  HENT:  Head: Normocephalic and atraumatic.  Swollen turbinates tender palpation about the right frontal sinus TMs unremarkable bilaterally  Eyes: EOM are normal. Pupils are equal, round, and reactive to light.  Neck: Normal range of motion. Neck supple.  Cardiovascular: Normal rate and regular rhythm.  Exam reveals no gallop and no friction rub.   No murmur heard. Pulmonary/Chest: Effort normal. She has no wheezes. She has no rales.  Abdominal: Soft. She exhibits no distension. There is no tenderness. There is no rebound and no guarding.   Musculoskeletal: She exhibits no edema or tenderness.  Neurological: She is alert and oriented to person, place, and time.  Skin: Skin is warm and dry. She is not diaphoretic.  Psychiatric: She has a normal mood and affect. Her behavior is normal.    ED Course  Procedures (including critical care time) Labs Review Labs Reviewed  URINALYSIS, ROUTINE W REFLEX MICROSCOPIC (NOT AT [facility]) - Abnormal; Notable for the following:    APPearance CLOUDY (*)    All other components within normal limits  POC URINE PREG, ED  I-STAT CHEM 8, ED    Imaging Review No results found. I have personally reviewed and evaluated these images and lab results as part of my medical decision-making.   EKG Interpretation   Date/Time:  Thursday October 20 2014 15:46:07 EDT Ventricular Rate:  91 PR Interval:  168 QRS Duration: 96 QT Interval:  335 QTC Calculation: 412 R Axis:   14 Text Interpretation:  Sinus rhythm Baseline wander in lead(s) V6 no wpw,  brugada, prolonged qt Confirmed by Ilena Dieckman MD, Reuel Boom ([id number]) on 10/20/2014  5:04:12 PM      MDM   Final diagnoses:  Acute frontal sinusitis, recurrence not specified  Syncope and collapse  Non-intractable vomiting with nausea, vomiting of unspecified type    24 yo F with a chief complaint of syncope. Patient had this episode immediately after vomiting. Patient been feeling somewhat crappy all day today. Denies any chest pain shortness breath. Has a right frontal headache. Patient appears to have a upper respiratory infection on exam with swollen turbinates. Tender palpation of the right frontal sinus. Likely sinusitis. Syncope sounds vagal. Patient is not pregnant EKG with no signs of Wolff-Parkinson-White, prolonged QT, or Brugada's  Patient reassessed and feeling better. I stat chem 8 without lab abnormality. Patient requesting discharge home. Due to patient's severe facial pain will treat for sinusitis. PCP follow-up  6:08 PM: I have discussed  the diagnosis/risks/treatment options with the patient and believe the pt to be eligible for discharge home to follow-up with PCP. We also discussed returning to the ED immediately if new or worsening sx occur. We discussed the sx which are most concerning (e.g., recurrent syncope, worsening sx) that necessitate immediate return. Medications administered to the patient during their visit and any new prescriptions provided to the patient are listed below.  Medications given during this visit Medications  ondansetron (ZOFRAN-ODT) disintegrating tablet 4 mg (4 mg Oral Given 10/20/14 1650)  sodium chloride 0.9 % bolus 1,000 mL (0 mLs Intravenous Stopped 10/20/14 1807)  acetaminophen (TYLENOL) tablet 1,000 mg (1,000 mg Oral Given 10/20/14 1650)  ibuprofen (ADVIL,MOTRIN) tablet 800 mg (800 mg Oral Given 10/20/14 1650)    New Prescriptions   AMOXICILLIN-CLAVULANATE (AUGMENTIN) 875-125 MG PER TABLET    Take 1 tablet by mouth 2 (two) times daily. One po bid x 7 days   ONDANSETRON (ZOFRAN ODT) 4 MG DISINTEGRATING TABLET     ODT q4 hours prn nausea/vomit     The patient appears reasonably screen and/or stabilized for discharge and I doubt any other medical condition or other [facility] requiring further screening, evaluation, or treatment in the ED at this  time prior to discharge.    Melene Plan, DO 10/20/14 [phone]

## 2014-10-20 NOTE — ED Notes (Signed)
Pt speaks Twi. A dialect of [place] from [place].

## 2015-03-27 ENCOUNTER — Ambulatory Visit: Payer: Self-pay | Admitting: Family Medicine

## 2015-07-13 ENCOUNTER — Encounter (HOSPITAL_COMMUNITY): Payer: Self-pay | Admitting: Emergency Medicine

## 2015-07-13 ENCOUNTER — Emergency Department (HOSPITAL_COMMUNITY)
Admission: EM | Admit: 2015-07-13 | Discharge: 2015-07-13 | Disposition: A | Discharge status: Home / Self Care | Payer: Self-pay | Attending: Emergency Medicine | Admitting: Emergency Medicine

## 2015-07-13 DIAGNOSIS — O26891 Other specified pregnancy related conditions, first trimester: Secondary | ICD-10-CM | POA: Insufficient documentation

## 2015-07-13 DIAGNOSIS — Z3A01 Less than 8 weeks gestation of pregnancy: Secondary | ICD-10-CM | POA: Insufficient documentation

## 2015-07-13 DIAGNOSIS — R1010 Upper abdominal pain, unspecified: Secondary | ICD-10-CM | POA: Insufficient documentation

## 2015-07-13 DIAGNOSIS — R55 Syncope and collapse: Secondary | ICD-10-CM

## 2015-07-13 DIAGNOSIS — Z3490 Encounter for supervision of normal pregnancy, unspecified, unspecified trimester: Secondary | ICD-10-CM

## 2015-07-13 DIAGNOSIS — O219 Vomiting of pregnancy, unspecified: Secondary | ICD-10-CM | POA: Insufficient documentation

## 2015-07-13 LAB — HEPATIC FUNCTION PANEL
ALBUMIN: 3.8 g/dL (ref 3.5–5.0)
ALT: 57 U/L — ABNORMAL HIGH (ref 14–54)
AST: 21 U/L (ref 15–41)
Alkaline Phosphatase: 46 U/L (ref 38–126)
BILIRUBIN INDIRECT: 0.9 mg/dL (ref 0.3–0.9)
Bilirubin, Direct: 0.2 mg/dL (ref 0.1–0.5)
TOTAL PROTEIN: 7.2 g/dL (ref 6.5–8.1)
Total Bilirubin: 1.1 mg/dL (ref 0.3–1.2)

## 2015-07-13 LAB — URINALYSIS, ROUTINE W REFLEX MICROSCOPIC
Glucose, UA: NEGATIVE mg/dL
Hgb urine dipstick: NEGATIVE
KETONES UR: 15 mg/dL — AB
NITRITE: NEGATIVE
PROTEIN: NEGATIVE mg/dL
Specific Gravity, Urine: 1.034 — ABNORMAL HIGH (ref 1.005–1.030)
pH: 6 (ref 5.0–8.0)

## 2015-07-13 LAB — CBC
HCT: 37.2 % (ref 36.0–46.0)
Hemoglobin: 12.4 g/dL (ref 12.0–15.0)
MCH: 31.2 pg (ref 26.0–34.0)
MCHC: 33.3 g/dL (ref 30.0–36.0)
MCV: 93.5 fL (ref 78.0–100.0)
PLATELETS: 221 10*3/uL (ref 150–400)
RBC: 3.98 MIL/uL (ref 3.87–5.11)
RDW: 12.2 % (ref 11.5–15.5)
WBC: 5.9 10*3/uL (ref 4.0–10.5)

## 2015-07-13 LAB — LIPASE, BLOOD: LIPASE: 21 U/L (ref 11–51)

## 2015-07-13 LAB — URINE MICROSCOPIC-ADD ON

## 2015-07-13 LAB — BASIC METABOLIC PANEL
Anion gap: 8 (ref 5–15)
BUN: 8 mg/dL (ref 6–20)
CHLORIDE: 106 mmol/L (ref 101–111)
CO2: 20 mmol/L — AB (ref 22–32)
Calcium: 9.3 mg/dL (ref 8.9–10.3)
Creatinine, Ser: 0.61 mg/dL (ref 0.44–1.00)
GFR calc non Af Amer: >60 mL/min (ref >60)
Glucose, Bld: 80 mg/dL (ref 65–99)
Potassium: 3.9 mmol/L (ref 3.5–5.1)
Sodium: 134 mmol/L — ABNORMAL LOW (ref 135–145)

## 2015-07-13 LAB — POC URINE PREG, ED: PREG TEST UR: POSITIVE — AB

## 2015-07-13 MED ORDER — SODIUM CHLORIDE 0.9 % IV BOLUS (SEPSIS)
1000.0000 mL | Freq: Once | INTRAVENOUS | Status: AC
  Administered 2015-07-13: 1000 mL via INTRAVENOUS

## 2015-07-13 MED ORDER — METOCLOPRAMIDE HCL 5 MG/ML IJ SOLN
10.0000 mg | Freq: Once | INTRAMUSCULAR | Status: AC
  Administered 2015-07-13: 10 mg via INTRAVENOUS
  Filled 2015-07-13: qty 2

## 2015-07-13 MED ORDER — GI COCKTAIL ~~LOC~~
30.0000 mL | Freq: Once | ORAL | Status: DC
  Filled 2015-07-13: qty 30

## 2015-07-13 MED ORDER — METOCLOPRAMIDE HCL 10 MG PO TABS: 10.0000 mg | ORAL_TABLET | Freq: Four times a day (QID) | ORAL | Status: DC

## 2015-07-13 MED ORDER — ONDANSETRON HCL 4 MG/2ML IJ SOLN
4.0000 mg | Freq: Once | INTRAMUSCULAR | Status: DC
  Filled 2015-07-13: qty 2

## 2015-07-13 MED ORDER — DIPHENHYDRAMINE HCL 50 MG/ML IJ SOLN
25.0000 mg | Freq: Once | INTRAMUSCULAR | Status: AC
  Administered 2015-07-13: 25 mg via INTRAVENOUS
  Filled 2015-07-13: qty 1

## 2015-07-13 NOTE — ED Notes (Signed)
Patient Alert and oriented X4. Stable and ambulatory. Patient verbalized understanding of the discharge instructions.  Patient belongings were taken by the patient.  

## 2015-07-13 NOTE — ED Notes (Signed)
Tea given to patient for fluid challenge.

## 2015-07-13 NOTE — ED Notes (Signed)
Patient states she had a syncopal episode this morning about 1000am.   Patient states she just got dizzy and passed out.   Patient denies hitting head.   Patient complains of abdominal pain, nausea, vomiting.   Patient states she has been sick about a weak.

## 2015-07-13 NOTE — ED Notes (Signed)
Patient able to drink tea, no vomiting afterwards

## 2015-07-13 NOTE — ED Provider Notes (Signed)
CSN: [id number]     Arrival date & time 07/13/15  1156 History  By signing my name below, I, [facility], attest that this documentation has been prepared under the direction and in the presence of Melene Plan, DO. Electronically Signed: Randell Patient, ED Scribe. 07/13/2015. 6:12 PM.   Chief Complaint  Patient presents with  . Loss of Consciousness    The history is provided by the patient. No language interpreter was used.  HPI Comments: Morgan Mathews is a 25 y.o. female who presents to the Emergency Department complaining of one episode of unwitnessed syncope that occurred 7 hours ago. Pt states that she was sitting in her kitchen when she became dizzy after which she stood up and began to walk when she lost consciousness and fell to the ground. She reports associated nausea, emesis, and upper bilateral abdominal pain to the (anterior axillar line bilaterally) for the past week. Abdominal pain and emesis is worse with eating and drinking. Denies diarrhea or any other symptoms currently.   Past Medical History  Diagnosis Date  . Medical history non-contributory    Past Surgical History  Procedure Laterality Date  . No past surgeries     No family history on file. Social History  Substance Use Topics  . Smoking status: Never Smoker   . Smokeless tobacco: Never Used  . Alcohol Use: No   OB History    Gravida Para Term Preterm AB TAB SAB Ectopic Multiple Living   0 1     Review of Systems  Constitutional: Negative for fever and chills.  HENT: Negative for congestion and rhinorrhea.   Eyes: Negative for redness and visual disturbance.  Respiratory: Negative for shortness of breath and wheezing.   Cardiovascular: Negative for chest pain and palpitations.  Gastrointestinal: Positive for nausea, vomiting and abdominal pain. Negative for diarrhea.  Genitourinary: Negative for dysuria and urgency.  Musculoskeletal: Negative for myalgias and arthralgias.   Skin: Negative for pallor and wound.  Neurological: Positive for dizziness and syncope. Negative for headaches.  All other systems reviewed and are negative.    Allergies  Review of patient's allergies indicates no known allergies.  Home Medications   Prior to Admission medications   Medication Sig Start Date End Date Taking? Authorizing Provider  acetaminophen (TYLENOL) 500 MG tablet Take 1 tablet (500 mg total) by mouth every 6 (six) hours as needed. Patient not taking: Reported on 08/22/2014 05/11/14   Massie Maroon, FNP  amoxicillin-clavulanate (AUGMENTIN) 875-125 MG per tablet Take 1 tablet by mouth 2 (two) times daily. One po bid x 7 days Patient not taking: Reported on 07/13/2015 10/20/14   Melene Plan, DO  metoCLOPramide (REGLAN) 10 MG tablet Take 1 tablet (10 mg total) by mouth every 6 (six) hours. 07/13/15   Melene Plan, DO  norgestimate-ethinyl estradiol (ORTHO-CYCLEN,SPRINTEC,PREVIFEM) 0.25-35 MG-MCG tablet Take 1 tablet by mouth daily. Patient not taking: Reported on 05/10/2014 04/14/14   Rhona Raider Stinson, DO  ondansetron (ZOFRAN ODT) 4 MG disintegrating tablet  ODT q4 hours prn nausea/vomit Patient not taking: Reported on 07/13/2015 10/20/14   Melene Plan, DO   BP 106/59 mmHg  Pulse 98  Temp(Src) 98.2 F (36.8 C) (Oral)  Resp 19  SpO2 99%  LMP 05/18/2015 Physical Exam  Constitutional: She is oriented to person, place, and time. She appears well-developed and well-nourished. No distress.  Appears well-hydrated.   HENT:  Head: Normocephalic and atraumatic.  Eyes: EOM are normal. Pupils  are equal, round, and reactive to light.  Neck: Normal range of motion. Neck supple.  Cardiovascular: Normal rate and regular rhythm.  Exam reveals no gallop and no friction rub.   No murmur heard. Pulmonary/Chest: Effort normal. She has no wheezes. She has no rales.  Abdominal: Soft. She exhibits no distension. There is tenderness.  Pt points to her anterior axillary line for source of  pain. Negative Murphy's sign. No abdominal tenderness on exam.  Musculoskeletal: She exhibits no edema or tenderness.  Neurological: She is alert and oriented to person, place, and time.  Skin: Skin is warm and dry. She is not diaphoretic.  Psychiatric: She has a normal mood and affect. Her behavior is normal.  Nursing note and vitals reviewed.   ED Course  Procedures   DIAGNOSTIC STUDIES: Oxygen Saturation is 100% on RA, normal by my interpretation.    COORDINATION OF CARE: 5:02 PM Will order labs, Zofran, GI cocktail, and IV fluids. Discussed treatment plan with pt at bedside and pt agreed to plan.    Labs Review Labs Reviewed  BASIC METABOLIC PANEL - Abnormal; Notable for the following:    Sodium 134 (*)    CO2 20 (*)    All other components within normal limits  URINALYSIS, ROUTINE W REFLEX MICROSCOPIC (NOT AT [facility]) - Abnormal; Notable for the following:    Color, Urine AMBER (*)    APPearance TURBID (*)    Specific Gravity, Urine 1.034 (*)    Bilirubin Urine SMALL (*)    Ketones, ur 15 (*)    Leukocytes, UA TRACE (*)    All other components within normal limits  HEPATIC FUNCTION PANEL - Abnormal; Notable for the following:    ALT 57 (*)    All other components within normal limits  URINE MICROSCOPIC-ADD ON - Abnormal; Notable for the following:    Squamous Epithelial / LPF 6-30 (*)    Bacteria, UA RARE (*)    Crystals CA OXALATE CRYSTALS (*)    All other components within normal limits  POC URINE PREG, ED - Abnormal; Notable for the following:    Preg Test, Ur POSITIVE (*)    All other components within normal limits  CBC  LIPASE, BLOOD  CBG MONITORING, ED    Imaging Review No results found. I have personally reviewed and evaluated these images and lab results as part of my medical decision-making.   EKG Interpretation   Date/Time:  Thursday July 13 2015 12:54:08 EDT Ventricular Rate:  65 PR Interval:  162 QRS Duration: 88 QT Interval:  368 QTC  Calculation: 382 R Axis:   33 Text Interpretation:  Sinus rhythm with marked sinus arrhythmia Abnormal  ECG no prolonged qt, no brugada, no wpw Confirmed by Niquita Digioia MD, DANIEL  ([id number]) on 07/13/2015 4:32:08 PM      EJ placement: 20 gauge IV placed in R EJ. Skin prepped with alcohol pads, R EJ identified with Valsalva. Cannulated with good return of dark, non-pulsatile blood. Tachyderm placed after easily flushed with NS.  MDM   Final diagnoses:  Vasovagal syncope  Pregnancy    25 yo F With a chief complaint of syncope. Vasovagal by history. Patient also complaining of nausea and vomiting going on for the past couple weeks. Also complaining of some upper abdominal tenderness. No pain on my exam. Will obtain LFTs and lipase. Treat with GI cocktail. Reassess.  Patient pregnancy test is positive. Patient's last menstrual period was in mid April. Denies any abdominal cramping vaginal bleeding  or vaginal discharge. We'll give Reglan and Benadryl instead of GI cocktail and Zofran.  Feeling better, tolerating PO.  D/c home.   7:09 PM:  I have discussed the diagnosis/risks/treatment options with the patient and family and believe the pt to be eligible for discharge home to follow-up with OB. We also discussed returning to the ED immediately if new or worsening sx occur. We discussed the sx which are most concerning (e.g., sudden worsening pain, fever, inability to tolerate by mouth) that necessitate immediate return. Medications administered to the patient during their visit and any new prescriptions provided to the patient are listed below.  Medications given during this visit Medications  sodium chloride 0.9 % bolus 1,000 mL (1,000 mLs Intravenous New Bag/Given 07/13/15 1806)  metoCLOPramide (REGLAN) injection 10 mg (10 mg Intravenous Given 07/13/15 1806)  diphenhydrAMINE (BENADRYL) injection 25 mg (25 mg Intravenous Given 07/13/15 1807)    New Prescriptions   METOCLOPRAMIDE (REGLAN) 10 MG TABLET     Take 1 tablet (10 mg total) by mouth every 6 (six) hours.    The patient appears reasonably screen and/or stabilized for discharge and I doubt any other medical condition or other [facility] requiring further screening, evaluation, or treatment in the ED at this time prior to discharge.    I personally performed the services described in this documentation, which was scribed in my presence. The recorded information has been reviewed and is accurate.      Melene Planan Maliik Karner, DO 07/13/15 1909

## 2015-07-13 NOTE — ED Notes (Signed)
Patient ambulated to the restroom

## 2015-07-13 NOTE — ED Notes (Signed)
This RN unable to gain IV access, Dr Adela LankFloyd notified and to placed US IV or IV in EJ.

## 2015-07-13 NOTE — Discharge Instructions (Signed)
First Trimester of Pregnancy The first trimester of pregnancy is from week 1 until the end of week 12 (months 1 through 3). A week after a sperm fertilizes an egg, the egg will implant on the wall of the uterus. This embryo will begin to develop into a baby. Genes from you and your partner are forming the baby. The female genes determine whether the baby is a boy or a girl. At 6-8 weeks, the eyes and face are formed, and the heartbeat can be seen on ultrasound. At the end of 12 weeks, all the baby's organs are formed.  Now that you are pregnant, you will want to do everything you can to have a healthy baby. Two of the most important things are to get good prenatal care and to follow your health care provider's instructions. Prenatal care is all the medical care you receive before the baby's birth. This care will help prevent, find, and treat any problems during the pregnancy and childbirth. BODY CHANGES Your body goes through many changes during pregnancy. The changes vary from woman to woman.   You may gain or lose a couple of pounds at first.  You may feel sick to your stomach (nauseous) and throw up (vomit). If the vomiting is uncontrollable, call your health care provider.  You may tire easily.  You may develop headaches that can be relieved by medicines approved by your health care provider.  You may urinate more often. Painful urination may mean you have a bladder infection.  You may develop heartburn as a result of your pregnancy.  You may develop constipation because certain hormones are causing the muscles that push waste through your intestines to slow down.  You may develop hemorrhoids or swollen, bulging veins (varicose veins).  Your breasts may begin to grow larger and become tender. Your nipples may stick out more, and the tissue that surrounds them (areola) may become darker.  Your gums may bleed and may be sensitive to brushing and flossing.  Dark spots or blotches (chloasma,  mask of pregnancy) may develop on your face. This will likely fade after the baby is born.  Your menstrual periods will stop.  You may have a loss of appetite.  You may develop cravings for certain kinds of food.  You may have changes in your emotions from day to day, such as being excited to be pregnant or being concerned that something may go wrong with the pregnancy and baby.  You may have more vivid and strange dreams.  You may have changes in your hair. These can include thickening of your hair, rapid growth, and changes in texture. Some women also have hair loss during or after pregnancy, or hair that feels dry or thin. Your hair will most likely return to normal after your baby is born. WHAT TO EXPECT AT YOUR PRENATAL VISITS During a routine prenatal visit:  You will be weighed to make sure you and the baby are growing normally.  Your blood pressure will be taken.  Your abdomen will be measured to track your baby's growth.  The fetal heartbeat will be listened to starting around week 10 or 12 of your pregnancy.  Test results from any previous visits will be discussed. Your health care provider may ask you:  How you are feeling.  If you are feeling the baby move.  If you have had any abnormal symptoms, such as leaking fluid, bleeding, severe headaches, or abdominal cramping.  If you are using any tobacco products,   including cigarettes, chewing tobacco, and electronic cigarettes.  If you have any questions. Other tests that may be performed during your first trimester include:  Blood tests to find your blood type and to check for the presence of any previous infections. They will also be used to check for low iron levels (anemia) and Rh antibodies. Later in the pregnancy, blood tests for diabetes will be done along with other tests if problems develop.  Urine tests to check for infections, diabetes, or protein in the urine.  An ultrasound to confirm the proper growth  and development of the baby.  An amniocentesis to check for possible genetic problems.  Fetal screens for spina bifida and Down syndrome.  You may need other tests to make sure you and the baby are doing well.  HIV (human immunodeficiency virus) testing. Routine prenatal testing includes screening for HIV, unless you choose not to have this test. HOME CARE INSTRUCTIONS  Medicines  Follow your health care provider's instructions regarding medicine use. Specific medicines may be either safe or unsafe to take during pregnancy.  Take your prenatal vitamins as directed.  If you develop constipation, try taking a stool softener if your health care provider approves. Diet  Eat regular, well-balanced meals. Choose a variety of foods, such as meat or vegetable-based protein, fish, milk and low-fat dairy products, vegetables, fruits, and whole grain breads and cereals. Your health care provider will help you determine the amount of weight gain that is right for you.  Avoid raw meat and uncooked cheese. These carry germs that can cause birth defects in the baby.  Eating four or five small meals rather than three large meals a day may help relieve nausea and vomiting. If you start to feel nauseous, eating a few soda crackers can be helpful. Drinking liquids between meals instead of during meals also seems to help nausea and vomiting.  If you develop constipation, eat more high-fiber foods, such as fresh vegetables or fruit and whole grains. Drink enough fluids to keep your urine clear or pale yellow. Activity and Exercise  Exercise only as directed by your health care provider. Exercising will help you:  Control your weight.  Stay in shape.  Be prepared for labor and delivery.  Experiencing pain or cramping in the lower abdomen or low back is a good sign that you should stop exercising. Check with your health care provider before continuing normal exercises.  Try to avoid standing for long  periods of time. Move your legs often if you must stand in one place for a long time.  Avoid heavy lifting.  Wear low-heeled shoes, and practice good posture.  You may continue to have sex unless your health care provider directs you otherwise. Relief of Pain or Discomfort  Wear a good support bra for breast tenderness.   Take warm sitz baths to soothe any pain or discomfort caused by hemorrhoids. Use hemorrhoid cream if your health care provider approves.   Rest with your legs elevated if you have leg cramps or low back pain.  If you develop varicose veins in your legs, wear support hose. Elevate your feet for 15 minutes, 3-4 times a day. Limit salt in your diet. Prenatal Care  Schedule your prenatal visits by the twelfth week of pregnancy. They are usually scheduled monthly at first, then more often in the last 2 months before delivery.  Write down your questions. Take them to your prenatal visits.  Keep all your prenatal visits as directed by your   health care provider. Safety  Wear your seat belt at all times when driving.  Make a list of emergency phone numbers, including numbers for family, friends, the hospital, and police and fire departments. General Tips  Ask your health care provider for a referral to a local prenatal education class. Begin classes no later than at the beginning of month 6 of your pregnancy.  Ask for help if you have counseling or nutritional needs during pregnancy. Your health care provider can offer advice or refer you to specialists for help with various needs.  Do not use hot tubs, steam rooms, or saunas.  Do not douche or use tampons or scented sanitary pads.  Do not cross your legs for long periods of time.  Avoid cat litter boxes and soil used by cats. These carry germs that can cause birth defects in the baby and possibly loss of the fetus by miscarriage or stillbirth.  Avoid all smoking, herbs, alcohol, and medicines not prescribed by  your health care provider. Chemicals in these affect the formation and growth of the baby.  Do not use any tobacco products, including cigarettes, chewing tobacco, and electronic cigarettes. If you need help quitting, ask your health care provider. You may receive counseling support and other resources to help you quit.  Schedule a dentist appointment. At home, brush your teeth with a soft toothbrush and be gentle when you floss. SEEK MEDICAL CARE IF:   You have dizziness.  You have mild pelvic cramps, pelvic pressure, or nagging pain in the abdominal area.  You have persistent nausea, vomiting, or diarrhea.  You have a bad smelling vaginal discharge.  You have pain with urination.  You notice increased swelling in your face, hands, legs, or ankles. SEEK IMMEDIATE MEDICAL CARE IF:   You have a fever.  You are leaking fluid from your vagina.  You have spotting or bleeding from your vagina.  You have severe abdominal cramping or pain.  You have rapid weight gain or loss.  You vomit blood or material that looks like coffee grounds.  You are exposed to German measles and have never had them.  You are exposed to fifth disease or chickenpox.  You develop a severe headache.  You have shortness of breath.  You have any kind of trauma, such as from a fall or a car accident.   This information is not intended to replace advice given to you by your health care provider. Make sure you discuss any questions you have with your health care provider.   Document Released: 01/08/2001 Document Revised: 02/04/2014 Document Reviewed: 11/24/2012 Elsevier Interactive Patient Education 2016 [employer]  

## 2015-07-27 ENCOUNTER — Telehealth: Payer: Self-pay | Admitting: *Deleted

## 2015-07-27 ENCOUNTER — Encounter: Payer: Self-pay | Admitting: Pediatric Intensive Care

## 2015-07-27 DIAGNOSIS — R112 Nausea with vomiting, unspecified: Secondary | ICD-10-CM

## 2015-07-27 NOTE — Congregational Nurse Program (Signed)
Congregational Nurse Program Note  Date of Encounter: 07/27/2015  Past Medical History: Past Medical History  Diagnosis Date  . Medical history non-contributory     Encounter Details:     CNP Questionnaire - 07/27/15 1508    Patient Demographics   Is this a new or existing patient? New   Patient is considered a/an Immigrant   Race African   Patient Assistance   Location of Patient Assistance [employer] [place]   Patient's financial/insurance status [employer]   Uninsured Patient Yes   Interventions Assisted patient in making appt.   Patient referred to apply for the following financial assistance Not Applicable   Food insecurities addressed Not Applicable   Transportation assistance No   Assistance securing medications No   Educational health offerings Other   Encounter Details   Primary purpose of visit Acute Illness/Condition Visit   Was an Emergency Department visit averted? Not Applicable   Does patient have a medical provider? Yes   Patient referred to Not Applicable   Was a mental health screening completed? (GAINS tool) No   Does patient have dental issues? No   Does patient have vision issues? No   Does your patient have an abnormal blood pressure today? No   Since previous encounter, have you referred patient for abnormal blood pressure that resulted in a new diagnosis or medication change? No   Does your patient have an abnormal blood glucose today? No   Since previous encounter, have you referred patient for abnormal blood glucose that resulted in a new diagnosis or medication change? No   Was there a life-saving intervention made? No    Client states that she has nausea all day and cannot tolerate any food or liquids. States that her LMP was 4/20 and that she was seen in the ED due to a fainting episode. She had a positive urine HCG in the ED. Client states that she has her first prenatal visit on 7/24 but requests CN intervene to move up appointment due  to continued nausea, vomiting and inability to tolerate food/fluids. Client states that she voids only 1-2 times per day and it is "very dark". States that she has lower back pain with coughing. Client has intermittent dry cough. BBS are clear. Blood pressure per VS flowsheet. Client advised to continue drinking small amounts of liquid to stay hydrated. [facility] appointment is first available. Left message for helpline nurse for medication.

## 2015-07-27 NOTE — Telephone Encounter (Signed)
Pt needs rx for nausea and vomiting. Not being seen here until end of July.

## 2015-08-09 NOTE — Telephone Encounter (Signed)
Called pt with [employer] # [phone] and heard message stating that the subscriber voice mailbox is full. Pt will need to be contacted later.

## 2015-08-21 ENCOUNTER — Encounter: Payer: Self-pay | Admitting: Advanced Practice Midwife

## 2015-08-21 ENCOUNTER — Ambulatory Visit (INDEPENDENT_AMBULATORY_CARE_PROVIDER_SITE_OTHER): Payer: Self-pay | Admitting: Family Medicine

## 2015-08-21 VITALS — BP 104/65 | HR 73 | Wt 184.0 lb

## 2015-08-21 DIAGNOSIS — Z3491 Encounter for supervision of normal pregnancy, unspecified, first trimester: Secondary | ICD-10-CM

## 2015-08-21 DIAGNOSIS — O219 Vomiting of pregnancy, unspecified: Secondary | ICD-10-CM

## 2015-08-21 DIAGNOSIS — Z113 Encounter for screening for infections with a predominantly sexual mode of transmission: Secondary | ICD-10-CM

## 2015-08-21 DIAGNOSIS — O210 Mild hyperemesis gravidarum: Secondary | ICD-10-CM | POA: Insufficient documentation

## 2015-08-21 DIAGNOSIS — O09899 Supervision of other high risk pregnancies, unspecified trimester: Secondary | ICD-10-CM | POA: Insufficient documentation

## 2015-08-21 DIAGNOSIS — O09891 Supervision of other high risk pregnancies, first trimester: Secondary | ICD-10-CM

## 2015-08-21 DIAGNOSIS — O99810 Abnormal glucose complicating pregnancy: Secondary | ICD-10-CM

## 2015-08-21 DIAGNOSIS — Z789 Other specified health status: Secondary | ICD-10-CM

## 2015-08-21 LAB — POCT URINALYSIS DIP (DEVICE)
Glucose, UA: NEGATIVE mg/dL
HGB URINE DIPSTICK: NEGATIVE
KETONES UR: 15 mg/dL — AB
Nitrite: POSITIVE — AB
PH: 7 (ref 5.0–8.0)
Protein, ur: 30 mg/dL — AB
SPECIFIC GRAVITY, URINE: 1.02 (ref 1.005–1.030)
Urobilinogen, UA: >=8 mg/dL (ref 0.0–1.0)

## 2015-08-21 MED ORDER — DOXYLAMINE SUCCINATE (SLEEP) 25 MG PO TABS: 25.0000 mg | ORAL_TABLET | Freq: Every evening | ORAL | Status: DC | PRN

## 2015-08-21 MED ORDER — PRENATAL VITAMINS 0.8 MG PO TABS: 1.0000 | ORAL_TABLET | Freq: Every day | ORAL | 12 refills | Status: DC

## 2015-08-21 MED ORDER — VITAMIN B-6 25 MG PO TABS: 25.0000 mg | ORAL_TABLET | Freq: Every evening | ORAL | 1 refills | Status: DC | PRN

## 2015-08-21 MED ORDER — CALCIUM CARBONATE ANTACID 500 MG PO CHEW: 1.0000 | CHEWABLE_TABLET | Freq: Three times a day (TID) | ORAL | 1 refills | Status: DC

## 2015-08-21 NOTE — Progress Notes (Signed)
Here for first ob visit. Given new patient education packet. Urinalysis nitrite positive.States since pregnant- not eating hardly any because of nausea and vomiting.

## 2015-08-21 NOTE — Progress Notes (Signed)
Subjective:    Morgan Mathews is being seen today for her first obstetrical visit.  This is not a planned pregnancy. She is at [redacted]w[redacted]d gestation. Her obstetrical history is significant for close interval pregnancy (delivered 03/2015). Relationship with FOB: spouse, living together. Patient does intend to breast feed. Pregnancy history fully reviewed.  Patient reports nausea, vomiting and weight loss due to not able to eat from [facility]  Review of Systems:   Review of Systems  Review of Systems - Negative except significant nausea/vomiting with weight loss.   Objective:     BP 104/65   Pulse 73   Wt 184 lb (83.5 kg)   LMP 05/18/2015 (Exact Date)   BMI 30.62 kg/m  Physical Exam  Exam  Physical Examination: General appearance - alert, well appearing, and in no distress, oriented to person, place, and time and overweight Mental status - alert, oriented to person, place, and time, normal mood, behavior, speech, dress, motor activity, and thought processes Mouth - mucous membranes moist, pharynx normal without lesions Neck - supple, no significant adenopathy Lymphatics - no palpable lymphadenopathy, no hepatosplenomegaly Heart - normal rate, regular rhythm, normal S1, S2, no murmurs, rubs, clicks or gallops Abdomen - soft, nontender, nondistended, no masses or organomegaly Breasts - breasts appear normal, no suspicious masses, no skin or nipple changes or axillary nodes Neurological - alert, oriented, normal speech, no focal findings or movement disorder noted Musculoskeletal - no joint tenderness, deformity or swelling, full range of motion without pain Extremities - peripheral pulses normal, no pedal edema, no clubbing or cyanosis Skin - normal coloration and turgor, no rashes, no suspicious skin lesions noted    Assessment:    Pregnancy: 25 y/o G2P1001 at 13.5 weeks by LMP.  Patient Active Problem List   Diagnosis Date Noted  . Supervision of low-risk pregnancy 08/21/2015  .  Short interval between pregnancies affecting pregnancy in first trimester, antepartum 08/21/2015  . Vomiting complicating pregnancy, antepartum 08/21/2015  . Language barrier to communication 05/11/2014  . Mastitis chronic 05/10/2014  . Breast pain, right 05/10/2014  . GBS bacteriuria 01/19/2014       Plan:   1. Supervision of low-risk pregnancy, first trimester - Prenatal Profile - Hemoglobinopathy Evaluation - Culture, OB Urine - Pain Mgmt, Profile 6 Conf w/o mM, U - GC/Chlamydia probe amp ([place])not at [facility] - Glucose Tolerance, 1 HR (50g) w/o Fasting - PNV   2. Vomiting complicating pregnancy, antepartum - vitamin B-6 (PYRIDOXINE) 25 MG tablet; Take 1 tablet (25 mg total) by mouth at bedtime as needed (Take with doxylamine.).  Dispense: 30 tablet; Refill: 1 - doxylamine (Sleep) (UNISOM) tablet 25 mg; Take 1 tablet (25 mg total) by mouth at bedtime as needed (For severe nausea and vomiting). - calcium carbonate (TUMS) 500 MG chewable tablet; Chew 1 tablet (200 mg of elemental calcium total) by mouth 3 (three) times daily. As needed for nausea.  Dispense: 90 tablet; Refill: 1  3. Short interval between pregnancies affecting pregnancy in first trimester, antepartum - Delivered 03/2015, was using breastfeeding as contraception.  4. Language barrier to communication [employer] ([place]), needs interpretor at next visit.    Initial labs drawn. Prenatal vitamins. Problem list reviewed and updated. AFP3 discussed: undecided, to address at next visit with intepretor. Role of ultrasound in pregnancy discussed; fetal survey: requested. Discussed to be performed at [redacted] weeks EGA. Amniocentesis discussed: not indicated. Follow up in 4 weeks. 50% of 30 min visit spent on counseling and coordination of care.  Interpretor needed at next visit, education limited today.   Jen Mow, DO [place] [facility] 08/21/2015

## 2015-08-21 NOTE — Patient Instructions (Addendum)
First Trimester of Pregnancy °The first trimester of pregnancy is from week 1 until the end of week 12 (months 1 through 3). A week after a sperm fertilizes an egg, the egg will implant on the wall of the uterus. This embryo will begin to develop into a baby. Genes from you and your partner are forming the baby. The female genes determine whether the baby is a boy or a girl. At 6-8 weeks, the eyes and face are formed, and the heartbeat can be seen on ultrasound. At the end of 12 weeks, all the baby's organs are formed.  °Now that you are pregnant, you will want to do everything you can to have a healthy baby. Two of the most important things are to get good prenatal care and to follow your health care provider's instructions. Prenatal care is all the medical care you receive before the baby's birth. This care will help prevent, find, and treat any problems during the pregnancy and childbirth. °BODY CHANGES °Your body goes through many changes during pregnancy. The changes vary from woman to woman.  °· You may gain or lose a couple of pounds at first. °· You may feel sick to your stomach (nauseous) and throw up (vomit). If the vomiting is uncontrollable, call your health care provider. °· You may tire easily. °· You may develop headaches that can be relieved by medicines approved by your health care provider. °· You may urinate more often. Painful urination may mean you have a bladder infection. °· You may develop heartburn as a result of your pregnancy. °· You may develop constipation because certain hormones are causing the muscles that push waste through your intestines to slow down. °· You may develop hemorrhoids or swollen, bulging veins (varicose veins). °· Your breasts may begin to grow larger and become tender. Your nipples may stick out more, and the tissue that surrounds them (areola) may become darker. °· Your gums may bleed and may be sensitive to brushing and flossing. °· Dark spots or blotches (chloasma,  mask of pregnancy) may develop on your face. This will likely fade after the baby is born. °· Your menstrual periods will stop. °· You may have a loss of appetite. °· You may develop cravings for certain kinds of food. °· You may have changes in your emotions from day to day, such as being excited to be pregnant or being concerned that something may go wrong with the pregnancy and baby. °· You may have more vivid and strange dreams. °· You may have changes in your hair. These can include thickening of your hair, rapid growth, and changes in texture. Some women also have hair loss during or after pregnancy, or hair that feels dry or thin. Your hair will most likely return to normal after your baby is born. °WHAT TO EXPECT AT YOUR PRENATAL VISITS °During a routine prenatal visit: °· You will be weighed to make sure you and the baby are growing normally. °· Your blood pressure will be taken. °· Your abdomen will be measured to track your baby's growth. °· The fetal heartbeat will be listened to starting around week 10 or 12 of your pregnancy. °· Test results from any previous visits will be discussed. °Your health care provider may ask you: °· How you are feeling. °· If you are feeling the baby move. °· If you have had any abnormal symptoms, such as leaking fluid, bleeding, severe headaches, or abdominal cramping. °· If you are using any tobacco products,   including cigarettes, chewing tobacco, and electronic cigarettes. °· If you have any questions. °Other tests that may be performed during your first trimester include: °· Blood tests to find your blood type and to check for the presence of any previous infections. They will also be used to check for low iron levels (anemia) and Rh antibodies. Later in the pregnancy, blood tests for diabetes will be done along with other tests if problems develop. °· Urine tests to check for infections, diabetes, or protein in the urine. °· An ultrasound to confirm the proper growth  and development of the baby. °· An amniocentesis to check for possible genetic problems. °· Fetal screens for spina bifida and Down syndrome. °· You may need other tests to make sure you and the baby are doing well. °· HIV (human immunodeficiency virus) testing. Routine prenatal testing includes screening for HIV, unless you choose not to have this test. °HOME CARE INSTRUCTIONS  °Medicines °· Follow your health care provider's instructions regarding medicine use. Specific medicines may be either safe or unsafe to take during pregnancy. °· Take your prenatal vitamins as directed. °· If you develop constipation, try taking a stool softener if your health care provider approves. °Diet °· Eat regular, well-balanced meals. Choose a variety of foods, such as meat or vegetable-based protein, fish, milk and low-fat dairy products, vegetables, fruits, and whole grain breads and cereals. Your health care provider will help you determine the amount of weight gain that is right for you. °· Avoid raw meat and uncooked cheese. These carry germs that can cause birth defects in the baby. °· Eating four or five small meals rather than three large meals a day may help relieve nausea and vomiting. If you start to feel nauseous, eating a few soda crackers can be helpful. Drinking liquids between meals instead of during meals also seems to help nausea and vomiting. °· If you develop constipation, eat more high-fiber foods, such as fresh vegetables or fruit and whole grains. Drink enough fluids to keep your urine clear or pale yellow. °Activity and Exercise °· Exercise only as directed by your health care provider. Exercising will help you: °¨ Control your weight. °¨ Stay in shape. °¨ Be prepared for labor and delivery. °· Experiencing pain or cramping in the lower abdomen or low back is a good sign that you should stop exercising. Check with your health care provider before continuing normal exercises. °· Try to avoid standing for long  periods of time. Move your legs often if you must stand in one place for a long time. °· Avoid heavy lifting. °· Wear low-heeled shoes, and practice good posture. °· You may continue to have sex unless your health care provider directs you otherwise. °Relief of Pain or Discomfort °· Wear a good support bra for breast tenderness.   °· Take warm sitz baths to soothe any pain or discomfort caused by hemorrhoids. Use hemorrhoid cream if your health care provider approves.   °· Rest with your legs elevated if you have leg cramps or low back pain. °· If you develop varicose veins in your legs, wear support hose. Elevate your feet for 15 minutes, 3-4 times a day. Limit salt in your diet. °Prenatal Care °· Schedule your prenatal visits by the twelfth week of pregnancy. They are usually scheduled monthly at first, then more often in the last 2 months before delivery. °· Write down your questions. Take them to your prenatal visits. °· Keep all your prenatal visits as directed by your   health care provider. °Safety °· Wear your seat belt at all times when driving. °· Make a list of emergency phone numbers, including numbers for family, friends, the hospital, and police and fire departments. °General Tips °· Ask your health care provider for a referral to a local prenatal education class. Begin classes no later than at the beginning of month 6 of your pregnancy. °· Ask for help if you have counseling or nutritional needs during pregnancy. Your health care provider can offer advice or refer you to specialists for help with various needs. °· Do not use hot tubs, steam rooms, or saunas. °· Do not douche or use tampons or scented sanitary pads. °· Do not cross your legs for long periods of time. °· Avoid cat litter boxes and soil used by cats. These carry germs that can cause birth defects in the baby and possibly loss of the fetus by miscarriage or stillbirth. °· Avoid all smoking, herbs, alcohol, and medicines not prescribed by  your health care provider. Chemicals in these affect the formation and growth of the baby. °· Do not use any tobacco products, including cigarettes, chewing tobacco, and electronic cigarettes. If you need help quitting, ask your health care provider. You may receive counseling support and other resources to help you quit. °· Schedule a dentist appointment. At home, brush your teeth with a soft toothbrush and be gentle when you floss. °SEEK MEDICAL CARE IF:  °· You have dizziness. °· You have mild pelvic cramps, pelvic pressure, or nagging pain in the abdominal area. °· You have persistent nausea, vomiting, or diarrhea. °· You have a bad smelling vaginal discharge. °· You have pain with urination. °· You notice increased swelling in your face, hands, legs, or ankles. °SEEK IMMEDIATE MEDICAL CARE IF:  °· You have a fever. °· You are leaking fluid from your vagina. °· You have spotting or bleeding from your vagina. °· You have severe abdominal cramping or pain. °· You have rapid weight gain or loss. °· You vomit blood or material that looks like coffee grounds. °· You are exposed to German measles and have never had them. °· You are exposed to fifth disease or chickenpox. °· You develop a severe headache. °· You have shortness of breath. °· You have any kind of trauma, such as from a fall or a car accident. °  °This information is not intended to replace advice given to you by your health care provider. Make sure you discuss any questions you have with your health care provider. °  °Document Released: 01/08/2001 Document Revised: 02/04/2014 Document Reviewed: 11/24/2012 °Elsevier Interactive Patient Education ©2016 [employer] ° °First Trimester of Pregnancy °The first trimester of pregnancy is from week 1 until the end of week 12 (months 1 through 3). A week after a sperm fertilizes an egg, the egg will implant on the wall of the uterus. This embryo will begin to develop into a baby. Genes from you and your partner  are forming the baby. The female genes determine whether the baby is a boy or a girl. At 6-8 weeks, the eyes and face are formed, and the heartbeat can be seen on ultrasound. At the end of 12 weeks, all the baby's organs are formed.  °Now that you are pregnant, you will want to do everything you can to have a healthy baby. Two of the most important things are to get good prenatal care and to follow your health care provider's instructions. Prenatal care is all the   medical care you receive before the baby's birth. This care will help prevent, find, and treat any problems during the pregnancy and childbirth. °BODY CHANGES °Your body goes through many changes during pregnancy. The changes vary from woman to woman.  °· You may gain or lose a couple of pounds at first. °· You may feel sick to your stomach (nauseous) and throw up (vomit). If the vomiting is uncontrollable, call your health care provider. °· You may tire easily. °· You may develop headaches that can be relieved by medicines approved by your health care provider. °· You may urinate more often. Painful urination may mean you have a bladder infection. °· You may develop heartburn as a result of your pregnancy. °· You may develop constipation because certain hormones are causing the muscles that push waste through your intestines to slow down. °· You may develop hemorrhoids or swollen, bulging veins (varicose veins). °· Your breasts may begin to grow larger and become tender. Your nipples may stick out more, and the tissue that surrounds them (areola) may become darker. °· Your gums may bleed and may be sensitive to brushing and flossing. °· Dark spots or blotches (chloasma, mask of pregnancy) may develop on your face. This will likely fade after the baby is born. °· Your menstrual periods will stop. °· You may have a loss of appetite. °· You may develop cravings for certain kinds of food. °· You may have changes in your emotions from day to day, such as being  excited to be pregnant or being concerned that something may go wrong with the pregnancy and baby. °· You may have more vivid and strange dreams. °· You may have changes in your hair. These can include thickening of your hair, rapid growth, and changes in texture. Some women also have hair loss during or after pregnancy, or hair that feels dry or thin. Your hair will most likely return to normal after your baby is born. °WHAT TO EXPECT AT YOUR PRENATAL VISITS °During a routine prenatal visit: °· You will be weighed to make sure you and the baby are growing normally. °· Your blood pressure will be taken. °· Your abdomen will be measured to track your baby's growth. °· The fetal heartbeat will be listened to starting around week 10 or 12 of your pregnancy. °· Test results from any previous visits will be discussed. °Your health care provider may ask you: °· How you are feeling. °· If you are feeling the baby move. °· If you have had any abnormal symptoms, such as leaking fluid, bleeding, severe headaches, or abdominal cramping. °· If you are using any tobacco products, including cigarettes, chewing tobacco, and electronic cigarettes. °· If you have any questions. °Other tests that may be performed during your first trimester include: °· Blood tests to find your blood type and to check for the presence of any previous infections. They will also be used to check for low iron levels (anemia) and Rh antibodies. Later in the pregnancy, blood tests for diabetes will be done along with other tests if problems develop. °· Urine tests to check for infections, diabetes, or protein in the urine. °· An ultrasound to confirm the proper growth and development of the baby. °· An amniocentesis to check for possible genetic problems. °· Fetal screens for spina bifida and Down syndrome. °· You may need other tests to make sure you and the baby are doing well. °· HIV (human immunodeficiency virus) testing. Routine prenatal testing  includes   screening for HIV, unless you choose not to have this test. °HOME CARE INSTRUCTIONS  °Medicines °· Follow your health care provider's instructions regarding medicine use. Specific medicines may be either safe or unsafe to take during pregnancy. °· Take your prenatal vitamins as directed. °· If you develop constipation, try taking a stool softener if your health care provider approves. °Diet °· Eat regular, well-balanced meals. Choose a variety of foods, such as meat or vegetable-based protein, fish, milk and low-fat dairy products, vegetables, fruits, and whole grain breads and cereals. Your health care provider will help you determine the amount of weight gain that is right for you. °· Avoid raw meat and uncooked cheese. These carry germs that can cause birth defects in the baby. °· Eating four or five small meals rather than three large meals a day may help relieve nausea and vomiting. If you start to feel nauseous, eating a few soda crackers can be helpful. Drinking liquids between meals instead of during meals also seems to help nausea and vomiting. °· If you develop constipation, eat more high-fiber foods, such as fresh vegetables or fruit and whole grains. Drink enough fluids to keep your urine clear or pale yellow. °Activity and Exercise °· Exercise only as directed by your health care provider. Exercising will help you: °¨ Control your weight. °¨ Stay in shape. °¨ Be prepared for labor and delivery. °· Experiencing pain or cramping in the lower abdomen or low back is a good sign that you should stop exercising. Check with your health care provider before continuing normal exercises. °· Try to avoid standing for long periods of time. Move your legs often if you must stand in one place for a long time. °· Avoid heavy lifting. °· Wear low-heeled shoes, and practice good posture. °· You may continue to have sex unless your health care provider directs you otherwise. °Relief of Pain or Discomfort °· Wear  a good support bra for breast tenderness.   °· Take warm sitz baths to soothe any pain or discomfort caused by hemorrhoids. Use hemorrhoid cream if your health care provider approves.   °· Rest with your legs elevated if you have leg cramps or low back pain. °· If you develop varicose veins in your legs, wear support hose. Elevate your feet for 15 minutes, 3-4 times a day. Limit salt in your diet. °Prenatal Care °· Schedule your prenatal visits by the twelfth week of pregnancy. They are usually scheduled monthly at first, then more often in the last 2 months before delivery. °· Write down your questions. Take them to your prenatal visits. °· Keep all your prenatal visits as directed by your health care provider. °Safety °· Wear your seat belt at all times when driving. °· Make a list of emergency phone numbers, including numbers for family, friends, the hospital, and police and fire departments. °General Tips °· Ask your health care provider for a referral to a local prenatal education class. Begin classes no later than at the beginning of month 6 of your pregnancy. °· Ask for help if you have counseling or nutritional needs during pregnancy. Your health care provider can offer advice or refer you to specialists for help with various needs. °· Do not use hot tubs, steam rooms, or saunas. °· Do not douche or use tampons or scented sanitary pads. °· Do not cross your legs for long periods of time. °· Avoid cat litter boxes and soil used by cats. These carry germs that can cause birth defects in   the baby and possibly loss of the fetus by miscarriage or stillbirth. °· Avoid all smoking, herbs, alcohol, and medicines not prescribed by your health care provider. Chemicals in these affect the formation and growth of the baby. °· Do not use any tobacco products, including cigarettes, chewing tobacco, and electronic cigarettes. If you need help quitting, ask your health care provider. You may receive counseling support and  other resources to help you quit. °· Schedule a dentist appointment. At home, brush your teeth with a soft toothbrush and be gentle when you floss. °SEEK MEDICAL CARE IF:  °· You have dizziness. °· You have mild pelvic cramps, pelvic pressure, or nagging pain in the abdominal area. °· You have persistent nausea, vomiting, or diarrhea. °· You have a bad smelling vaginal discharge. °· You have pain with urination. °· You notice increased swelling in your face, hands, legs, or ankles. °SEEK IMMEDIATE MEDICAL CARE IF:  °· You have a fever. °· You are leaking fluid from your vagina. °· You have spotting or bleeding from your vagina. °· You have severe abdominal cramping or pain. °· You have rapid weight gain or loss. °· You vomit blood or material that looks like coffee grounds. °· You are exposed to German measles and have never had them. °· You are exposed to fifth disease or chickenpox. °· You develop a severe headache. °· You have shortness of breath. °· You have any kind of trauma, such as from a fall or a car accident. °  °This information is not intended to replace advice given to you by your health care provider. Make sure you discuss any questions you have with your health care provider. °  °Document Released: 01/08/2001 Document Revised: 02/04/2014 Document Reviewed: 11/24/2012 °Elsevier Interactive Patient Education ©2016 [employer] Inc. ° °

## 2015-08-22 LAB — PRENATAL PROFILE (SOLSTAS)
Antibody Screen: NEGATIVE
BASOS ABS: 0 {cells}/uL (ref 0–200)
Basophils Relative: 0 %
Eosinophils Absolute: 52 cells/uL (ref 15–500)
Eosinophils Relative: 1 %
HCT: 34.4 % — ABNORMAL LOW (ref 35.0–45.0)
HEP B S AG: NEGATIVE
HIV: NONREACTIVE
Hemoglobin: 11.5 g/dL — ABNORMAL LOW (ref 11.7–15.5)
LYMPHS ABS: 1768 {cells}/uL (ref 850–3900)
LYMPHS PCT: 34 %
MCH: 32.8 pg (ref 27.0–33.0)
MCHC: 33.4 g/dL (ref 32.0–36.0)
MCV: 98 fL (ref 80.0–100.0)
MONO ABS: 312 {cells}/uL (ref 200–950)
MPV: 11 fL (ref 7.5–12.5)
Monocytes Relative: 6 %
NEUTROS PCT: 59 %
Neutro Abs: 3068 cells/uL (ref 1500–7800)
PLATELETS: 223 10*3/uL (ref 140–400)
RBC: 3.51 MIL/uL — ABNORMAL LOW (ref 3.80–5.10)
RDW: 14.9 % (ref 11.0–15.0)
RH TYPE: POSITIVE
RUBELLA: 9.91 {index} — AB (ref <0.90)
WBC: 5.2 10*3/uL (ref 3.8–10.8)

## 2015-08-22 LAB — GC/CHLAMYDIA PROBE AMP (~~LOC~~) NOT AT ARMC
CHLAMYDIA, DNA PROBE: NEGATIVE
NEISSERIA GONORRHEA: NEGATIVE

## 2015-08-22 LAB — GLUCOSE TOLERANCE, 1 HOUR (50G) W/O FASTING: Glucose, 1 Hr, gestational: 175 mg/dL — ABNORMAL HIGH (ref <140)

## 2015-08-23 LAB — HEMOGLOBINOPATHY EVALUATION
HCT: 34.4 % — ABNORMAL LOW (ref 35.0–45.0)
HEMOGLOBIN: 11.5 g/dL — AB (ref 11.7–15.5)
HGB A2 QUANT: 2.6 % (ref 1.8–3.5)
Hgb A: 96.4 % (ref >96.0)
MCH: 32.8 pg (ref 27.0–33.0)
MCV: 98 fL (ref 80.0–100.0)
RBC: 3.51 MIL/uL — AB (ref 3.80–5.10)
RDW: 14.9 % (ref 11.0–15.0)

## 2015-08-23 LAB — CULTURE, OB URINE

## 2015-08-23 NOTE — Telephone Encounter (Signed)
Patient was seen in our office on 08/21/2015

## 2015-08-27 LAB — PAIN MGMT, PROFILE 6 CONF W/O MM, U
6 ACETYLMORPHINE: NEGATIVE ng/mL (ref <10)
Alcohol Metabolites: NEGATIVE ng/mL (ref <500)
Amphetamines: NEGATIVE ng/mL (ref <500)
Barbiturates: NEGATIVE ng/mL (ref <300)
Benzodiazepines: NEGATIVE ng/mL (ref <100)
COCAINE METABOLITE: NEGATIVE ng/mL (ref <150)
Creatinine: 324.4 mg/dL (ref >=20.0)
MARIJUANA METABOLITE: NEGATIVE ng/mL (ref <20)
METHADONE METABOLITE: NEGATIVE ng/mL (ref <100)
OXYCODONE: NEGATIVE ng/mL (ref <100)
Opiates: NEGATIVE ng/mL (ref <100)
Oxidant: NEGATIVE ug/mL (ref <200)
PLEASE NOTE: 0
Phencyclidine: NEGATIVE ng/mL (ref <25)
pH: 8.07 (ref 4.5–9.0)

## 2015-08-30 NOTE — Addendum Note (Signed)
Addended by: Cleda Clarks on: 08/30/2015 10:00 PM   Modules accepted: Orders

## 2015-09-04 ENCOUNTER — Telehealth: Payer: Self-pay | Admitting: General Practice

## 2015-09-04 NOTE — Telephone Encounter (Signed)
Per Dr Omer JackMumaw, patient needs 3 hr gtt and [id number]. Called patient with [place] interpreter [phone]#[id number] & informed her of results and 3 hr gtt test guidelines. Patient verbalized understanding to all & had no questions.

## 2015-09-05 ENCOUNTER — Other Ambulatory Visit: Payer: Self-pay

## 2015-09-05 DIAGNOSIS — O99810 Abnormal glucose complicating pregnancy: Secondary | ICD-10-CM

## 2015-09-06 LAB — GLUCOSE TOLERANCE, 3 HOURS
GLUCOSE, 1 HOUR-GESTATIONAL: 107 mg/dL (ref <190)
GLUCOSE, FASTING-GESTATIONAL: 75 mg/dL (ref 65–104)
Glucose Tolerance, 2 hour: 98 mg/dL (ref <165)
Glucose, GTT - 3 Hour: 80 mg/dL (ref <145)

## 2015-09-06 LAB — HEMOGLOBIN A1C
Hgb A1c MFr Bld: 4 % (ref <5.7)
Mean Plasma Glucose: 68 mg/dL

## 2015-09-18 ENCOUNTER — Encounter: Payer: Self-pay | Admitting: Medical

## 2015-09-28 ENCOUNTER — Ambulatory Visit (INDEPENDENT_AMBULATORY_CARE_PROVIDER_SITE_OTHER): Payer: Self-pay | Admitting: Family Medicine

## 2015-09-28 VITALS — BP 108/62 | HR 80 | Wt 191.0 lb

## 2015-09-28 DIAGNOSIS — Z3689 Encounter for other specified antenatal screening: Secondary | ICD-10-CM

## 2015-09-28 DIAGNOSIS — Z3492 Encounter for supervision of normal pregnancy, unspecified, second trimester: Secondary | ICD-10-CM

## 2015-09-28 LAB — POCT URINALYSIS DIP (DEVICE)
BILIRUBIN URINE: NEGATIVE
GLUCOSE, UA: NEGATIVE mg/dL
HGB URINE DIPSTICK: NEGATIVE
Ketones, ur: NEGATIVE mg/dL
LEUKOCYTES UA: NEGATIVE
NITRITE: NEGATIVE
Protein, ur: NEGATIVE mg/dL
Specific Gravity, Urine: 1.02 (ref 1.005–1.030)
UROBILINOGEN UA: 2 mg/dL — AB (ref 0.0–1.0)
pH: 7.5 (ref 5.0–8.0)

## 2015-09-28 NOTE — Progress Notes (Signed)
   PRENATAL VISIT NOTE  Subjective:  Morgan Mathews is a 25 y.o. G2P1001 at 257w0d being seen today for ongoing prenatal care.  She is currently monitored for the following issues for this low-risk pregnancy and has GBS bacteriuria; Mastitis chronic; Breast pain, right; Language barrier to communication; Supervision of low-risk pregnancy; Short interval between pregnancies affecting pregnancy in first trimester, antepartum; and Vomiting complicating pregnancy, antepartum on her problem list.  Patient reports no complaints.  Contractions: Not present. Vag. Bleeding: None.  Movement: Absent. Denies leaking of fluid.   The following portions of the patient's history were reviewed and updated as appropriate: allergies, current medications, past family history, past medical history, past social history, past surgical history and problem list. Problem list updated.  Objective:   Vitals:   09/28/15 1520  BP: 108/62  Pulse: 80  Weight: 191 lb (86.6 kg)    Fetal Status: Fetal Heart Rate (bpm): 152   Movement: Absent     General:  Alert, oriented and cooperative. Patient is in no acute distress.  Skin: Skin is warm and dry. No rash noted.   Cardiovascular: Normal heart rate noted  Respiratory: Normal respiratory effort, no problems with respiration noted  Abdomen: Soft, gravid, appropriate for gestational age. Pain/Pressure: Present     Pelvic:  Cervical exam deferred        Extremities: Normal range of motion.  Edema: None  Mental Status: Normal mood and affect. Normal behavior. Normal judgment and thought content.   Urinalysis:      Assessment and Plan:  Pregnancy: G2P1001 at 437w0d  1. Encounter for fetal anatomic survey - US MFM OB COMP + 14 WK; Future  2. Supervision of low-risk pregnancy, second trimester FH and FHT normal.  Quad screen today.   Preterm labor symptoms and general obstetric precautions including but not limited to vaginal bleeding, contractions, leaking of fluid  and fetal movement were reviewed in detail with the patient. Please refer to After Visit Summary for other counseling recommendations.  No Follow-up on file.  Levie HeritageJacob J Amanda Steuart, DO

## 2015-09-29 LAB — AFP, QUAD SCREEN
AFP: 48.3 ng/mL
Curr Gest Age: 19 weeks
Down Syndrome Scr Risk Est: 1:32300 {titer}
HCG, Total: 6.4 IU/mL
INH: 189.6 pg/mL
INTERPRETATION-AFP: NEGATIVE
MOM FOR AFP: 1
MOM FOR INH: 1.23
MoM for hCG: 0.3
OPEN SPINA BIFIDA: NEGATIVE
Osb Risk: 1:27000 {titer}
TRI 18 SCR RISK EST: NEGATIVE
Trisomy 18 (Edward) Syndrome Interp.: 1:7540 {titer}
UE3 MOM: 1.3
uE3 Value: 1.92 ng/mL

## 2015-10-10 ENCOUNTER — Other Ambulatory Visit: Payer: Self-pay | Admitting: Family Medicine

## 2015-10-10 ENCOUNTER — Ambulatory Visit (HOSPITAL_COMMUNITY)
Admission: RE | Admit: 2015-10-10 | Discharge: 2015-10-10 | Disposition: A | Discharge status: Home / Self Care | Payer: Self-pay | Source: Ambulatory Visit | Attending: Family Medicine | Admitting: Family Medicine

## 2015-10-10 DIAGNOSIS — Z3689 Encounter for other specified antenatal screening: Secondary | ICD-10-CM

## 2015-10-10 DIAGNOSIS — Z36 Encounter for antenatal screening of mother: Secondary | ICD-10-CM | POA: Insufficient documentation

## 2015-10-10 DIAGNOSIS — Z3A20 20 weeks gestation of pregnancy: Secondary | ICD-10-CM | POA: Insufficient documentation

## 2015-10-24 ENCOUNTER — Ambulatory Visit (INDEPENDENT_AMBULATORY_CARE_PROVIDER_SITE_OTHER): Payer: Self-pay | Admitting: Advanced Practice Midwife

## 2015-10-24 VITALS — BP 107/77 | HR 89 | Wt 206.0 lb

## 2015-10-24 DIAGNOSIS — O2610 Low weight gain in pregnancy, unspecified trimester: Secondary | ICD-10-CM | POA: Insufficient documentation

## 2015-10-24 DIAGNOSIS — O09899 Supervision of other high risk pregnancies, unspecified trimester: Secondary | ICD-10-CM

## 2015-10-24 DIAGNOSIS — O2612 Low weight gain in pregnancy, second trimester: Secondary | ICD-10-CM

## 2015-10-24 DIAGNOSIS — O210 Mild hyperemesis gravidarum: Secondary | ICD-10-CM

## 2015-10-24 MED ORDER — ONDANSETRON HCL 8 MG PO TABS: 8.0000 mg | ORAL_TABLET | Freq: Three times a day (TID) | ORAL | 2 refills | Status: DC | PRN

## 2015-10-24 NOTE — Progress Notes (Signed)
   PRENATAL VISIT NOTE  Subjective:  Morgan Mathews is a 25 y.o. G2P1001 at 423w5d being seen today for ongoing prenatal care.  She is currently monitored for the following issues for this high-risk pregnancy and has GBS bacteriuria; Mastitis chronic; Breast pain, right; Language barrier to communication; Supervision of low-risk pregnancy; Short interval between pregnancies affecting pregnancy in first trimester, antepartum; and Vomiting complicating pregnancy, antepartum on her problem list.  Patient reports Nausea and vomiting significantly improved, but still present. [facility] of [facility]. Is not using antiemetics and still vomits several times per day. Lost 46 pounds at lowest weight per patient. Has gained back 22 pounds.  Movement: Present. Denies leaking of fluid.   The following portions of the patient's history were reviewed and updated as appropriate: allergies, current medications, past family history, past medical history, past social history, past surgical history and problem list. Problem list updated.  Objective:   Vitals:   10/24/15 1539  BP: 107/77  Pulse: 89  Weight: 206 lb (93.4 kg)    Fetal Status: Fetal Heart Rate (bpm): 137 Fundal Height: 22 cm Movement: Present     General:  Alert, oriented and cooperative. Patient is in no acute distress.  Skin: Skin is warm and dry. No rash noted.   Cardiovascular: Normal heart rate noted  Respiratory: Normal respiratory effort, no problems with respiration noted  Abdomen: Soft, gravid, appropriate for gestational age. Pain/Pressure: Absent     Pelvic:  Cervical exam deferred        Extremities: Normal range of motion.  Edema: None  Mental Status: Normal mood and affect. Normal behavior. Normal judgment and thought content.   Urinalysis:      Assessment and Plan:  Pregnancy: G2P1001 at 613w5d  1. Supervision of other high-risk pregnancy, second  trimester   2. Hyperemesis complicating pregnancy, antepartum  - ondansetron (ZOFRAN) 8 MG tablet; Take 1 tablet (8 mg total) by mouth every 8 (eight) hours as needed for nausea or vomiting.  Dispense: 20 tablet; Refill: 2  3. Poor weight gain pregnancy - Improving -  Awaiting [facility] paperwork to prescribe whole milk - Growth ultrasound PRN  Preterm labor symptoms and general obstetric precautions including but not limited to vaginal bleeding, contractions, leaking of fluid and fetal movement were reviewed in detail with the patient. Please refer to After Visit Summary for other counseling recommendations.  Return in about 4 weeks (around 11/21/2015) for ROB.  Dorathy KinsmanVirginia Brylin Stanislawski, CNM

## 2015-10-24 NOTE — Patient Instructions (Signed)

## 2015-11-10 ENCOUNTER — Telehealth: Payer: Self-pay | Admitting: *Deleted

## 2015-11-10 NOTE — Telephone Encounter (Signed)
Morgan Mathews called this am and left a message she needs to get a tb test for work and she wants to know if it is ok to get a tb test since she is pregnant.

## 2015-11-15 NOTE — Telephone Encounter (Signed)
I called Morgan Mathews and notified her she can get a tb test while pregnant and we can do it in our office or she can go to her pcp. She wants to do it at her next ob appt 11/24/15. I informed her we can do it then, but she will have to come in on the following Monday 11/27/15 to read the results. She voices understanding.

## 2015-11-23 ENCOUNTER — Encounter: Payer: Self-pay | Admitting: Obstetrics and Gynecology

## 2015-11-24 ENCOUNTER — Encounter: Payer: Self-pay | Admitting: Family Medicine

## 2015-12-07 ENCOUNTER — Ambulatory Visit (INDEPENDENT_AMBULATORY_CARE_PROVIDER_SITE_OTHER): Payer: Self-pay | Admitting: Family Medicine

## 2015-12-07 VITALS — BP 118/70 | HR 84 | Wt 212.0 lb

## 2015-12-07 DIAGNOSIS — Z23 Encounter for immunization: Secondary | ICD-10-CM

## 2015-12-07 DIAGNOSIS — O99810 Abnormal glucose complicating pregnancy: Secondary | ICD-10-CM

## 2015-12-07 DIAGNOSIS — O09899 Supervision of other high risk pregnancies, unspecified trimester: Secondary | ICD-10-CM

## 2015-12-07 DIAGNOSIS — Z3483 Encounter for supervision of other normal pregnancy, third trimester: Secondary | ICD-10-CM

## 2015-12-07 LAB — CBC
HCT: 32.5 % — ABNORMAL LOW (ref 35.0–45.0)
HEMOGLOBIN: 10.7 g/dL — AB (ref 11.7–15.5)
MCH: 32.1 pg (ref 27.0–33.0)
MCHC: 32.9 g/dL (ref 32.0–36.0)
MCV: 97.6 fL (ref 80.0–100.0)
MPV: 10.4 fL (ref 7.5–12.5)
Platelets: 205 10*3/uL (ref 140–400)
RBC: 3.33 MIL/uL — AB (ref 3.80–5.10)
RDW: 13 % (ref 11.0–15.0)
WBC: 6.1 10*3/uL (ref 3.8–10.8)

## 2015-12-07 MED ORDER — TETANUS-DIPHTH-ACELL PERTUSSIS 5-2.5-18.5 LF-MCG/0.5 IM SUSP
0.5000 mL | Freq: Once | INTRAMUSCULAR | Status: AC
  Administered 2015-12-07: 0.5 mL via INTRAMUSCULAR

## 2015-12-07 NOTE — Patient Instructions (Signed)
Third Trimester of Pregnancy The third trimester is from week 29 through week 42, months 7 through 9. The third trimester is a time when the fetus is growing rapidly. At the end of the ninth month, the fetus is about 20 inches in length and weighs 6-10 pounds.  BODY CHANGES Your body goes through many changes during pregnancy. The changes vary from woman to woman.   Your weight will continue to increase. You can expect to gain 25-35 pounds (11-16 kg) by the end of the pregnancy.  You may begin to get stretch marks on your hips, abdomen, and breasts.  You may urinate more often because the fetus is moving lower into your pelvis and pressing on your bladder.  You may develop or continue to have heartburn as a result of your pregnancy.  You may develop constipation because certain hormones are causing the muscles that push waste through your intestines to slow down.  You may develop hemorrhoids or swollen, bulging veins (varicose veins).  You may have pelvic pain because of the weight gain and pregnancy hormones relaxing your joints between the bones in your pelvis. Backaches may result from overexertion of the muscles supporting your posture.  You may have changes in your hair. These can include thickening of your hair, rapid growth, and changes in texture. Some women also have hair loss during or after pregnancy, or hair that feels dry or thin. Your hair will most likely return to normal after your baby is born.  Your breasts will continue to grow and be tender. A yellow discharge may leak from your breasts called colostrum.  Your belly button may stick out.  You may feel short of breath because of your expanding uterus.  You may notice the fetus "dropping," or moving lower in your abdomen.  You may have a bloody mucus discharge. This usually occurs a few days to a week before labor begins.  Your cervix becomes thin and soft (effaced) near your due date. WHAT TO EXPECT AT YOUR  PRENATAL EXAMS  You will have prenatal exams every 2 weeks until week 36. Then, you will have weekly prenatal exams. During a routine prenatal visit:  You will be weighed to make sure you and the fetus are growing normally.  Your blood pressure is taken.  Your abdomen will be measured to track your baby's growth.  The fetal heartbeat will be listened to.  Any test results from the previous visit will be discussed.  You may have a cervical check near your due date to see if you have effaced. At around 36 weeks, your caregiver will check your cervix. At the same time, your caregiver will also perform a test on the secretions of the vaginal tissue. This test is to determine if a type of bacteria, Group B streptococcus, is present. Your caregiver will explain this further. Your caregiver may ask you:  What your birth plan is.  How you are feeling.  If you are feeling the baby move.  If you have had any abnormal symptoms, such as leaking fluid, bleeding, severe headaches, or abdominal cramping.  If you are using any tobacco products, including cigarettes, chewing tobacco, and electronic cigarettes.  If you have any questions. Other tests or screenings that may be performed during your third trimester include:  Blood tests that check for low iron levels (anemia).  Fetal testing to check the health, activity level, and growth of the fetus. Testing is done if you have certain medical conditions or if   there are problems during the pregnancy.  HIV (human immunodeficiency virus) testing. If you are at high risk, you may be screened for HIV during your third trimester of pregnancy. FALSE LABOR You may feel small, irregular contractions that eventually go away. These are called Braxton Hicks contractions, or false labor. Contractions may last for hours, days, or even weeks before true labor sets in. If contractions come at regular intervals, intensify, or become painful, it is best to be seen  by your caregiver.  SIGNS OF LABOR   Menstrual-like cramps.  Contractions that are 5 minutes apart or less.  Contractions that start on the top of the uterus and spread down to the lower abdomen and back.  A sense of increased pelvic pressure or back pain.  A watery or bloody mucus discharge that comes from the vagina. If you have any of these signs before the 37th week of pregnancy, call your caregiver right away. You need to go to the hospital to get checked immediately. HOME CARE INSTRUCTIONS   Avoid all smoking, herbs, alcohol, and unprescribed drugs. These chemicals affect the formation and growth of the baby.  Do not use any tobacco products, including cigarettes, chewing tobacco, and electronic cigarettes. If you need help quitting, ask your health care provider. You may receive counseling support and other resources to help you quit.  Follow your caregiver's instructions regarding medicine use. There are medicines that are either safe or unsafe to take during pregnancy.  Exercise only as directed by your caregiver. Experiencing uterine cramps is a good sign to stop exercising.  Continue to eat regular, healthy meals.  Wear a good support bra for breast tenderness.  Do not use hot tubs, steam rooms, or saunas.  Wear your seat belt at all times when driving.  Avoid raw meat, uncooked cheese, cat litter boxes, and soil used by cats. These carry germs that can cause birth defects in the baby.  Take your prenatal vitamins.  Take 1500-2000 mg of calcium daily starting at the 20th week of pregnancy until you deliver your baby.  Try taking a stool softener (if your caregiver approves) if you develop constipation. Eat more high-fiber foods, such as fresh vegetables or fruit and whole grains. Drink plenty of fluids to keep your urine clear or pale yellow.  Take warm sitz baths to soothe any pain or discomfort caused by hemorrhoids. Use hemorrhoid cream if your caregiver  approves.  If you develop varicose veins, wear support hose. Elevate your feet for 15 minutes, 3-4 times a day. Limit salt in your diet.  Avoid heavy lifting, wear low heal shoes, and practice good posture.  Rest a lot with your legs elevated if you have leg cramps or low back pain.  Visit your dentist if you have not gone during your pregnancy. Use a soft toothbrush to brush your teeth and be gentle when you floss.  A sexual relationship may be continued unless your caregiver directs you otherwise.  Do not travel far distances unless it is absolutely necessary and only with the approval of your caregiver.  Take prenatal classes to understand, practice, and ask questions about the labor and delivery.  Make a trial run to the hospital.  Pack your hospital bag.  Prepare the baby's nursery.  Continue to go to all your prenatal visits as directed by your caregiver. SEEK MEDICAL CARE IF:  You are unsure if you are in labor or if your water has broken.  You have dizziness.  You have   mild pelvic cramps, pelvic pressure, or nagging pain in your abdominal area.  You have persistent nausea, vomiting, or diarrhea.  You have a bad smelling vaginal discharge.  You have pain with urination. SEEK IMMEDIATE MEDICAL CARE IF:   You have a fever.  You are leaking fluid from your vagina.  You have spotting or bleeding from your vagina.  You have severe abdominal cramping or pain.  You have rapid weight loss or gain.  You have shortness of breath with chest pain.  You notice sudden or extreme swelling of your face, hands, ankles, feet, or legs.  You have not felt your baby move in over an hour.  You have severe headaches that do not go away with medicine.  You have vision changes.   This information is not intended to replace advice given to you by your health care provider. Make sure you discuss any questions you have with your health care provider.   Document Released:  01/08/2001 Document Revised: 02/04/2014 Document Reviewed: 03/17/2012 Elsevier Interactive Patient Education 2016 [employer]  Breastfeeding Deciding to breastfeed is one of the best choices you can make for you and your baby. A change in hormones during pregnancy causes your breast tissue to grow and increases the number and size of your milk ducts. These hormones also allow proteins, sugars, and fats from your blood supply to make breast milk in your milk-producing glands. Hormones prevent breast milk from being released before your baby is born as well as prompt milk flow after birth. Once breastfeeding has begun, thoughts of your baby, as well as his or her sucking or crying, can stimulate the release of milk from your milk-producing glands.  BENEFITS OF BREASTFEEDING For Your Baby  Your first milk (colostrum) helps your baby's digestive system function better.  There are antibodies in your milk that help your baby fight off infections.  Your baby has a lower incidence of asthma, allergies, and sudden infant death syndrome.  The nutrients in breast milk are better for your baby than infant formulas and are designed uniquely for your baby's needs.  Breast milk improves your baby's brain development.  Your baby is less likely to develop other conditions, such as childhood obesity, asthma, or type 2 diabetes mellitus. For You  Breastfeeding helps to create a very special bond between you and your baby.  Breastfeeding is convenient. Breast milk is always available at the correct temperature and costs nothing.  Breastfeeding helps to burn calories and helps you lose the weight gained during pregnancy.  Breastfeeding makes your uterus contract to its prepregnancy size faster and slows bleeding (lochia) after you give birth.   Breastfeeding helps to lower your risk of developing type 2 diabetes mellitus, osteoporosis, and breast or ovarian cancer later in life. SIGNS THAT YOUR BABY IS  HUNGRY Early Signs of Hunger  Increased alertness or activity.  Stretching.  Movement of the head from side to side.  Movement of the head and opening of the mouth when the corner of the mouth or cheek is stroked (rooting).  Increased sucking sounds, smacking lips, cooing, sighing, or squeaking.  Hand-to-mouth movements.  Increased sucking of fingers or hands. Late Signs of Hunger  Fussing.  Intermittent crying. Extreme Signs of Hunger Signs of extreme hunger will require calming and consoling before your baby will be able to breastfeed successfully. Do not wait for the following signs of extreme hunger to occur before you initiate breastfeeding:  Restlessness.  A loud, strong cry.  Screaming.   BREASTFEEDING BASICS Breastfeeding Initiation  Find a comfortable place to sit or lie down, with your neck and back well supported.  Place a pillow or rolled up blanket under your baby to bring him or her to the level of your breast (if you are seated). Nursing pillows are specially designed to help support your arms and your baby while you breastfeed.  Make sure that your baby's abdomen is facing your abdomen.  Gently massage your breast. With your fingertips, massage from your chest wall toward your nipple in a circular motion. This encourages milk flow. You may need to continue this action during the feeding if your milk flows slowly.  Support your breast with 4 fingers underneath and your thumb above your nipple. Make sure your fingers are well away from your nipple and your baby's mouth.  Stroke your baby's lips gently with your finger or nipple.  When your baby's mouth is open wide enough, quickly bring your baby to your breast, placing your entire nipple and as much of the colored area around your nipple (areola) as possible into your baby's mouth.  More areola should be visible above your baby's upper lip than below the lower lip.  Your baby's tongue should be between his  or her lower gum and your breast.  Ensure that your baby's mouth is correctly positioned around your nipple (latched). Your baby's lips should create a seal on your breast and be turned out (everted).  It is common for your baby to suck about 2-3 minutes in order to start the flow of breast milk. Latching Teaching your baby how to latch on to your breast properly is very important. An improper latch can cause nipple pain and decreased milk supply for you and poor weight gain in your baby. Also, if your baby is not latched onto your nipple properly, he or she may swallow some air during feeding. This can make your baby fussy. Burping your baby when you switch breasts during the feeding can help to get rid of the air. However, teaching your baby to latch on properly is still the best way to prevent fussiness from swallowing air while breastfeeding. Signs that your baby has successfully latched on to your nipple:  Silent tugging or silent sucking, without causing you pain.  Swallowing heard between every 3-4 sucks.  Muscle movement above and in front of his or her ears while sucking. Signs that your baby has not successfully latched on to nipple:  Sucking sounds or smacking sounds from your baby while breastfeeding.  Nipple pain. If you think your baby has not latched on correctly, slip your finger into the corner of your baby's mouth to break the suction and place it between your baby's gums. Attempt breastfeeding initiation again. Signs of Successful Breastfeeding Signs from your baby:  A gradual decrease in the number of sucks or complete cessation of sucking.  Falling asleep.  Relaxation of his or her body.  Retention of a small amount of milk in his or her mouth.  Letting go of your breast by himself or herself. Signs from you:  Breasts that have increased in firmness, weight, and size 1-3 hours after feeding.  Breasts that are softer immediately after  breastfeeding.  Increased milk volume, as well as a change in milk consistency and color by the fifth day of breastfeeding.  Nipples that are not sore, cracked, or bleeding. Signs That Your Baby is Getting Enough Milk  Wetting at least 3 diapers in a 24-hour period.   The urine should be clear and pale yellow by age 5 days.  At least 3 stools in a 24-hour period by age 5 days. The stool should be soft and yellow.  At least 3 stools in a 24-hour period by age 7 days. The stool should be seedy and yellow.  No loss of weight greater than 10% of birth weight during the first 3 days of age.  Average weight gain of 4-7 ounces (113-198 g) per week after age 4 days.  Consistent daily weight gain by age 5 days, without weight loss after the age of 2 weeks. After a feeding, your baby may spit up a small amount. This is common. BREASTFEEDING FREQUENCY AND DURATION Frequent feeding will help you make more milk and can prevent sore nipples and breast engorgement. Breastfeed when you feel the need to reduce the fullness of your breasts or when your baby shows signs of hunger. This is called "breastfeeding on demand." Avoid introducing a pacifier to your baby while you are working to establish breastfeeding (the first 4-6 weeks after your baby is born). After this time you may choose to use a pacifier. Research has shown that pacifier use during the first year of a baby's life decreases the risk of sudden infant death syndrome (SIDS). Allow your baby to feed on each breast as long as he or she wants. Breastfeed until your baby is finished feeding. When your baby unlatches or falls asleep while feeding from the first breast, offer the second breast. Because newborns are often sleepy in the first few weeks of life, you may need to awaken your baby to get him or her to feed. Breastfeeding times will vary from baby to baby. However, the following rules can serve as a guide to help you ensure that your baby is  properly fed:  Newborns (babies 4 weeks of age or younger) may breastfeed every 1-3 hours.  Newborns should not go longer than 3 hours during the day or 5 hours during the night without breastfeeding.  You should breastfeed your baby a minimum of 8 times in a 24-hour period until you begin to introduce solid foods to your baby at around 6 months of age. BREAST MILK PUMPING Pumping and storing breast milk allows you to ensure that your baby is exclusively fed your breast milk, even at times when you are unable to breastfeed. This is especially important if you are going back to work while you are still breastfeeding or when you are not able to be present during feedings. Your lactation consultant can give you guidelines on how long it is safe to store breast milk. A breast pump is a machine that allows you to pump milk from your breast into a sterile bottle. The pumped breast milk can then be stored in a refrigerator or freezer. Some breast pumps are operated by hand, while others use electricity. Ask your lactation consultant which type will work best for you. Breast pumps can be purchased, but some hospitals and breastfeeding support groups lease breast pumps on a monthly basis. A lactation consultant can teach you how to hand express breast milk, if you prefer not to use a pump. CARING FOR YOUR BREASTS WHILE YOU BREASTFEED Nipples can become dry, cracked, and sore while breastfeeding. The following recommendations can help keep your breasts moisturized and healthy:  Avoid using soap on your nipples.  Wear a supportive bra. Although not required, special nursing bras and tank tops are designed to allow access to your   breasts for breastfeeding without taking off your entire bra or top. Avoid wearing underwire-style bras or extremely tight bras.  Air dry your nipples for 3-4minutes after each feeding.  Use only cotton bra pads to absorb leaked breast milk. Leaking of breast milk between feedings  is normal.  Use lanolin on your nipples after breastfeeding. Lanolin helps to maintain your skin's normal moisture barrier. If you use pure lanolin, you do not need to wash it off before feeding your baby again. Pure lanolin is not toxic to your baby. You may also hand express a few drops of breast milk and gently massage that milk into your nipples and allow the milk to air dry. In the first few weeks after giving birth, some women experience extremely full breasts (engorgement). Engorgement can make your breasts feel heavy, warm, and tender to the touch. Engorgement peaks within 3-5 days after you give birth. The following recommendations can help ease engorgement:  Completely empty your breasts while breastfeeding or pumping. You may want to start by applying warm, moist heat (in the shower or with warm water-soaked hand towels) just before feeding or pumping. This increases circulation and helps the milk flow. If your baby does not completely empty your breasts while breastfeeding, pump any extra milk after he or she is finished.  Wear a snug bra (nursing or regular) or tank top for 1-2 days to signal your body to slightly decrease milk production.  Apply ice packs to your breasts, unless this is too uncomfortable for you.  Make sure that your baby is latched on and positioned properly while breastfeeding. If engorgement persists after 48 hours of following these recommendations, contact your health care provider or a lactation consultant. OVERALL HEALTH CARE RECOMMENDATIONS WHILE BREASTFEEDING  Eat healthy foods. Alternate between meals and snacks, eating 3 of each per day. Because what you eat affects your breast milk, some of the foods may make your baby more irritable than usual. Avoid eating these foods if you are sure that they are negatively affecting your baby.  Drink milk, fruit juice, and water to satisfy your thirst (about 10 glasses a day).  Rest often, relax, and continue to take  your prenatal vitamins to prevent fatigue, stress, and anemia.  Continue breast self-awareness checks.  Avoid chewing and smoking tobacco. Chemicals from cigarettes that pass into breast milk and exposure to secondhand smoke may harm your baby.  Avoid alcohol and drug use, including marijuana. Some medicines that may be harmful to your baby can pass through breast milk. It is important to ask your health care provider before taking any medicine, including all over-the-counter and prescription medicine as well as vitamin and herbal supplements. It is possible to become pregnant while breastfeeding. If birth control is desired, ask your health care provider about options that will be safe for your baby. SEEK MEDICAL CARE IF:  You feel like you want to stop breastfeeding or have become frustrated with breastfeeding.  You have painful breasts or nipples.  Your nipples are cracked or bleeding.  Your breasts are red, tender, or warm.  You have a swollen area on either breast.  You have a fever or chills.  You have nausea or vomiting.  You have drainage other than breast milk from your nipples.  Your breasts do not become full before feedings by the fifth day after you give birth.  You feel sad and depressed.  Your baby is too sleepy to eat well.  Your baby is having trouble sleeping.     Your baby is wetting less than 3 diapers in a 24-hour period.  Your baby has less than 3 stools in a 24-hour period.  Your baby's skin or the white part of his or her eyes becomes yellow.   Your baby is not gaining weight by 5 days of age. SEEK IMMEDIATE MEDICAL CARE IF:  Your baby is overly tired (lethargic) and does not want to wake up and feed.  Your baby develops an unexplained fever.   This information is not intended to replace advice given to you by your health care provider. Make sure you discuss any questions you have with your health care provider.   Document Released: 01/14/2005  Document Revised: 10/05/2014 Document Reviewed: 07/08/2012 Elsevier Interactive Patient Education 2016 [employer]  

## 2015-12-07 NOTE — Progress Notes (Signed)
3hr gtt today.  

## 2015-12-07 NOTE — Progress Notes (Signed)
Twi interpreter: video used   PRENATAL VISIT NOTE  Subjective:  Morgan Mathews is a 25 y.o. G2P1001 at 3864w0d being seen today for ongoing prenatal care.  She is currently monitored for the following issues for this low-risk pregnancy and has Mastitis chronic; Breast pain, right; Language barrier to communication; Supervision of other high risk pregnancy, antepartum; Short interval between pregnancies affecting pregnancy in first trimester, antepartum; Hyperemesis affecting pregnancy, antepartum; and Poor weight gain of pregnancy on her problem list.  Patient reports no complaints.  Contractions: Not present. Vag. Bleeding: None.  Movement: Present. Denies leaking of fluid.   The following portions of the patient's history were reviewed and updated as appropriate: allergies, current medications, past family history, past medical history, past social history, past surgical history and problem list. Problem list updated.  Objective:   Vitals:   12/07/15 0941  BP: 118/70  Pulse: 84  Weight: 212 lb (96.2 kg)    Fetal Status: Fetal Heart Rate (bpm): 142 Fundal Height: 29 cm Movement: Present     General:  Alert, oriented and cooperative. Patient is in no acute distress.  Skin: Skin is warm and dry. No rash noted.   Cardiovascular: Normal heart rate noted  Respiratory: Normal respiratory effort, no problems with respiration noted  Abdomen: Soft, gravid, appropriate for gestational age. Pain/Pressure: Absent     Pelvic:  Cervical exam deferred        Extremities: Normal range of motion.  Edema: None  Mental Status: Normal mood and affect. Normal behavior. Normal judgment and thought content.   Assessment and Plan:  Pregnancy: G2P1001 at 6864w0d  1. Abnormal glucose tolerance test (GTT) during pregnancy, antepartum Repeat 3 hour today - Glucose tolerance, 3 hours   2. Supervision of other high risk pregnancy, antepartum Continue routine prenatal care.  - CBC - RPR - HIV antibody  (with reflex) - Flu Vaccine QUAD 36+ mos IM - Tdap (BOOSTRIX) injection 0.5 mL; Inject 0.5 mLs into the muscle once.  Preterm labor symptoms and general obstetric precautions including but not limited to vaginal bleeding, contractions, leaking of fluid and fetal movement were reviewed in detail with the patient. Please refer to After Visit Summary for other counseling recommendations.  Return in 2 weeks (on 12/21/2015).   Reva Boresanya S Armina Galloway, MD

## 2015-12-08 ENCOUNTER — Ambulatory Visit (INDEPENDENT_AMBULATORY_CARE_PROVIDER_SITE_OTHER): Payer: Self-pay | Admitting: Family Medicine

## 2015-12-08 ENCOUNTER — Encounter: Payer: Self-pay | Admitting: Family Medicine

## 2015-12-08 VITALS — BP 126/72 | HR 106 | Temp 98.3°F | Resp 16 | Ht 65.0 in | Wt 221.0 lb

## 2015-12-08 DIAGNOSIS — N644 Mastodynia: Secondary | ICD-10-CM

## 2015-12-08 LAB — HIV ANTIBODY (ROUTINE TESTING W REFLEX): HIV 1&2 Ab, 4th Generation: NONREACTIVE

## 2015-12-08 LAB — GLUCOSE TOLERANCE, 3 HOURS
GLUCOSE 3 HOUR GTT: 63 mg/dL — AB (ref <145)
GLUCOSE, 1 HOUR-GESTATIONAL: 149 mg/dL (ref <190)
GLUCOSE, 2 HOUR-GESTATIONAL: 120 mg/dL (ref <165)
Glucose Tolerance, Fasting: 68 mg/dL (ref 65–104)

## 2015-12-08 LAB — RPR

## 2015-12-08 NOTE — Progress Notes (Signed)
Morgan Mathews, is a 25 y.o. female  OZH:[id number]:[id number]  GEX:[id number]  DOB - 1990-07-15  CC:  Chief Complaint  Patient presents with  . Breast Mass    lump in breast/ pain        HPI: Morgan Mathews is a 25 y.o. female here for follow-up right breast pain. This has been an issue for at least a year. An US last year with same symptoms was negative. She reports that she has a lump that comes and goes in the right axilla. She is currently pregnant and has an [employer]. She would like a repeat ultrasound, but does not have any financial assistance in place.    No Known Allergies Past Medical History:  Diagnosis Date  . Medical history non-contributory    Current Outpatient Prescriptions on File Prior to Visit  Medication Sig Dispense Refill  . Prenatal Multivit-Min-Fe-FA (PRENATAL VITAMINS) 0.8 MG tablet Take 1 tablet by mouth daily. 30 tablet 12  . ondansetron (ZOFRAN) 8 MG tablet Take 1 tablet (8 mg total) by mouth every 8 (eight) hours as needed for nausea or vomiting. (Patient not taking: Reported on 12/08/2015) 20 tablet 2   No current facility-administered medications on file prior to visit.    No family history on file. Social History   Social History  . Marital status: Married    Spouse name: N/A  . Number of children: N/A  . Years of education: N/A   Occupational History  . Not on file.   Social History Main Topics  . Smoking status: Never Smoker  . Smokeless tobacco: Never Used  . Alcohol use No  . Drug use: No  . Sexual activity: Yes    Birth control/ protection: None   Other Topics Concern  . Not on file   Social History Narrative  . No narrative on file    Review of Systems: Constitutional: Negative Skin: Negative HENT: Negative  Eyes: Negative  Neck: Negative Respiratory: Negative Cardiovascular: Negative Gastrointestinal: Negative Genitourinary: Negative  Musculoskeletal: Negative   Neurological: Negative for Hematological: Negative   Psychiatric/Behavioral: Negative    Objective:   Vitals:   12/08/15 1100  BP: 126/72  Pulse: (!) 106  Resp: 16  Temp: 98.3 F (36.8 C)    Physical Exam: Constitutional: Patient appears well-developed and well-nourished. No distress. HENT: Normocephalic, atraumatic, External right and left ear normal. Oropharynx is clear and moist.  Eyes: Conjunctivae and EOM are normal. PERRLA, no scleral icterus. Neck: Normal ROM. Neck supple. No lymphadenopathy, No thyromegaly. CVS: RRR, S1/S2 +, no murmurs, no gallops, no rubs Pulmonary: Effort and breath sounds normal, no stridor, rhonchi, wheezes, rales.  Abdominal: Soft. Normoactive BS,, no distension, tenderness, rebound or guarding.  Musculoskeletal: Normal range of motion. No edema and no tenderness.  Neuro: Alert.Normal muscle tone coordination. Non-focal Skin: Skin is warm and dry. No rash noted. Not diaphoretic. No erythema. No pallor. Psychiatric: Normal mood and affect. Behavior, judgment, thought content normal.  Lab Results  Component Value Date   WBC 6.1 12/07/2015   HGB 10.7 (L) 12/07/2015   HCT 32.5 (L) 12/07/2015   MCV 97.6 12/07/2015   PLT 205 12/07/2015   Lab Results  Component Value Date   CREATININE 0.61 07/13/2015   BUN 8 07/13/2015   NA 134 (L) 07/13/2015   K 3.9 07/13/2015   CL 106 07/13/2015   CO2 20 (L) 07/13/2015    Lab Results  Component Value Date   HGBA1C 4.0 09/05/2015   Lipid Panel  No  results found for: CHOL, TRIG, HDL, CHOLHDL, VLDL, LDLCALC      Assessment and plan:   1. Right breast pain. -I have offered ressurance that a cancerous lump would not come and go and that this is most likely a lymph node. -Will consider an US  When she renews her [employer].   The patient was given clear instructions to go to ER or return to medical center if symptoms don't improve, worsen or new problems develop. The patient verbalized understanding.    Henrietta HooverLinda C Bernhardt FNP  12/08/2015, 2:02 PM

## 2015-12-11 ENCOUNTER — Ambulatory Visit (INDEPENDENT_AMBULATORY_CARE_PROVIDER_SITE_OTHER): Payer: Self-pay

## 2015-12-11 DIAGNOSIS — Z111 Encounter for screening for respiratory tuberculosis: Secondary | ICD-10-CM

## 2015-12-11 NOTE — Progress Notes (Signed)
Pt here today for TB Skin Test.  Pt denies ever having a positive skin test in the past.  Pt states that this is her first TB Skin test she's received.  PPD administered right forearm.  Pt tolerated well. Pt advised to come back in 48-72 hours to have PPD read.  I informed pt that if she is does not get read in 48-72 hours that the test will have to be repeated.  Pt stated understanding with no further questions.

## 2015-12-13 ENCOUNTER — Encounter: Payer: Self-pay | Admitting: *Deleted

## 2015-12-13 ENCOUNTER — Ambulatory Visit: Payer: No Typology Code available for payment source | Admitting: *Deleted

## 2015-12-13 DIAGNOSIS — Z111 Encounter for screening for respiratory tuberculosis: Secondary | ICD-10-CM

## 2015-12-13 NOTE — Progress Notes (Signed)
Patient presents for PPD skin test reading. PPD negative. Patient given results letter.

## 2015-12-25 ENCOUNTER — Encounter (HOSPITAL_COMMUNITY): Payer: Self-pay | Admitting: *Deleted

## 2015-12-25 ENCOUNTER — Inpatient Hospital Stay (HOSPITAL_COMMUNITY)
Admission: AD | Admit: 2015-12-25 | Discharge: 2015-12-25 | Disposition: A | Discharge status: Home / Self Care | Payer: No Typology Code available for payment source | Source: Ambulatory Visit | Attending: Obstetrics and Gynecology | Admitting: Obstetrics and Gynecology

## 2015-12-25 DIAGNOSIS — R0981 Nasal congestion: Secondary | ICD-10-CM | POA: Insufficient documentation

## 2015-12-25 DIAGNOSIS — O99353 Diseases of the nervous system complicating pregnancy, third trimester: Secondary | ICD-10-CM | POA: Insufficient documentation

## 2015-12-25 DIAGNOSIS — Z79899 Other long term (current) drug therapy: Secondary | ICD-10-CM | POA: Insufficient documentation

## 2015-12-25 DIAGNOSIS — O99613 Diseases of the digestive system complicating pregnancy, third trimester: Secondary | ICD-10-CM | POA: Insufficient documentation

## 2015-12-25 DIAGNOSIS — R5383 Other fatigue: Secondary | ICD-10-CM | POA: Insufficient documentation

## 2015-12-25 DIAGNOSIS — O219 Vomiting of pregnancy, unspecified: Secondary | ICD-10-CM

## 2015-12-25 DIAGNOSIS — K219 Gastro-esophageal reflux disease without esophagitis: Secondary | ICD-10-CM | POA: Insufficient documentation

## 2015-12-25 DIAGNOSIS — Z3A31 31 weeks gestation of pregnancy: Secondary | ICD-10-CM | POA: Insufficient documentation

## 2015-12-25 DIAGNOSIS — G44209 Tension-type headache, unspecified, not intractable: Secondary | ICD-10-CM

## 2015-12-25 DIAGNOSIS — R112 Nausea with vomiting, unspecified: Secondary | ICD-10-CM | POA: Insufficient documentation

## 2015-12-25 LAB — URINALYSIS, ROUTINE W REFLEX MICROSCOPIC
BILIRUBIN URINE: NEGATIVE
GLUCOSE, UA: NEGATIVE mg/dL
KETONES UR: NEGATIVE mg/dL
Leukocytes, UA: NEGATIVE
Nitrite: NEGATIVE
PH: 7 (ref 5.0–8.0)
Protein, ur: NEGATIVE mg/dL
SPECIFIC GRAVITY, URINE: 1.015 (ref 1.005–1.030)

## 2015-12-25 LAB — URINE MICROSCOPIC-ADD ON
Bacteria, UA: NONE SEEN
WBC, UA: NONE SEEN WBC/hpf (ref 0–5)

## 2015-12-25 MED ORDER — RANITIDINE HCL 150 MG PO TABS
150.0000 mg | ORAL_TABLET | Freq: Two times a day (BID) | ORAL | 0 refills | Status: DC
Start: 2015-12-25 — End: 2016-01-11

## 2015-12-25 MED ORDER — ACETAMINOPHEN 500 MG PO TABS
1000.0000 mg | ORAL_TABLET | Freq: Once | ORAL | Status: AC
  Administered 2015-12-25: 1000 mg via ORAL
  Filled 2015-12-25: qty 2

## 2015-12-25 MED ORDER — GI COCKTAIL ~~LOC~~
30.0000 mL | Freq: Once | ORAL | Status: AC
  Administered 2015-12-25: 30 mL via ORAL
  Filled 2015-12-25: qty 30

## 2015-12-25 MED ORDER — PROMETHAZINE HCL 12.5 MG PO TABS: 12.5000 mg | ORAL_TABLET | Freq: Four times a day (QID) | ORAL | 0 refills | Status: DC | PRN

## 2015-12-25 MED ORDER — PROMETHAZINE HCL 25 MG PO TABS
12.5000 mg | ORAL_TABLET | Freq: Four times a day (QID) | ORAL | Status: DC | PRN
  Administered 2015-12-25: 12.5 mg via ORAL
  Filled 2015-12-25: qty 1

## 2015-12-25 NOTE — Discharge Instructions (Signed)
Food Choices for Gastroesophageal Reflux Disease, Adult When you have gastroesophageal reflux disease (GERD), the foods you eat and your eating habits are very important. Choosing the right foods can help ease your discomfort. What guidelines do I need to follow?  Choose fruits, vegetables, whole grains, and low-fat dairy products.  Choose low-fat meat, fish, and poultry.  Limit fats such as oils, salad dressings, butter, nuts, and avocado.  Keep a food diary. This helps you identify foods that cause symptoms.  Avoid foods that cause symptoms. These may be different for everyone.  Eat small meals often instead of 3 large meals a day.  Eat your meals slowly, in a place where you are relaxed.  Limit fried foods.  Cook foods using methods other than frying.  Avoid drinking alcohol.  Avoid drinking large amounts of liquids with your meals.  Avoid bending over or lying down until 2-3 hours after eating. What foods are not recommended? These are some foods and drinks that may make your symptoms worse: Vegetables  Tomatoes. Tomato juice. Tomato and spaghetti sauce. Chili peppers. Onion and garlic. Horseradish. Fruits  Oranges, grapefruit, and lemon (fruit and juice). Meats  High-fat meats, fish, and poultry. This includes hot dogs, ribs, ham, sausage, salami, and bacon. Dairy  Whole milk and chocolate milk. Sour cream. Cream. Butter. Ice cream. Cream cheese. Drinks  Coffee and tea. Bubbly (carbonated) drinks or energy drinks. Condiments  Hot sauce. Barbecue sauce. Sweets/Desserts  Chocolate and cocoa. Donuts. Peppermint and spearmint. Fats and Oils  High-fat foods. This includes [place] fries and potato chips. Other  Vinegar. Strong spices. This includes black pepper, white pepper, red pepper, cayenne, curry powder, cloves, ginger, and chili powder. The items listed above may not be a complete list of foods and drinks to avoid. Contact your dietitian for more information.   This information is not intended to replace advice given to you by your health care provider. Make sure you discuss any questions you have with your health care provider. Document Released: 07/16/2011 Document Revised: 06/22/2015 Document Reviewed: 11/18/2012 Elsevier Interactive Patient Education  2017 Elsevier Inc.   Eating Plan for Hyperemesis Gravidarum Hyperemesis gravidarum is a severe form of morning sickness. Because this condition causes severe nausea and vomiting, it can lead to dehydration, malnutrition, and weight loss. One way to lessen the symptoms of nausea and vomiting is to follow the eating plan for hyperemesis gravidarum. It is often used along with prescribed medicines to control your symptoms. What can I do to relieve my symptoms? Listen to your body. Everyone is different and has different preferences. Find what works best for you. Take any of the following actions that are helpful to you:  Eat and drink slowly.  Eat 5-6 small meals daily instead of 3 large meals.  Eat crackers before you get out of bed in the morning.  Try having a snack in the middle of the night.  Starchy foods are usually tolerated well. Examples include cereal, toast, bread, potatoes, pasta, rice, and pretzels.  Ginger may help with nausea. Add  tsp ground ginger to hot tea or choose ginger tea.  Try drinking 100% fruit juice or an electrolyte drink. An electrolyte drink contains sodium, potassium, and chloride.  Continue to take your prenatal vitamins as told by your health care provider. If you are having trouble taking your prenatal vitamins, talk with your health care provider about different options.  Include at least 1 serving of protein with your meals and snacks. Protein options include  meats or poultry, beans, nuts, eggs, and yogurt. Try eating a protein-rich snack before bed. Examples of these snacks include cheese and crackers or half of a peanut butter or [place]  sandwich.  Consider eliminating foods that trigger your symptoms. These may include spicy foods, coffee, high-fat foods, very sweet foods, and acidic foods.  Try meals that have more protein combined with bland, salty, lower-fat, and dry foods, such as nuts, seeds, pretzels, crackers, and cereal.  Talk with your healthcare provider about starting a supplement of vitamin B6.  Have fluids that are cold, clear, and carbonated or sour. Examples include lemonade, ginger ale, lemon-lime soda, ice water, and sparkling water.  Try lemon or mint tea.  Try brushing your teeth or using a mouth rinse after meals. What should I avoid to reduce my symptoms? Avoiding some of the following things may help reduce your symptoms.  Foods with strong smells. Try eating meals in well-ventilated areas that are free of odors.  Drinking water or other beverages with meals. Try not to drink anything during the 30 minutes before and after your meals.  Drinking more than 1 cup of fluid at a time. Sometimes using a straw helps.  Fried or high-fat foods, such as butter and cream sauces.  Spicy foods.  Skipping meals as best as you can. Nausea can be more intense on an empty stomach. If you cannot tolerate food at that time, do not force it. Try sucking on ice chips or other frozen items, and make up for missed calories later.  Lying down within 2 hours after eating.  Environmental triggers. These may include smoky rooms, closed spaces, rooms with strong smells, warm or humid places, overly loud and noisy rooms, and rooms with motion or flickering lights.  Quick and sudden changes in your movement. This information is not intended to replace advice given to you by your health care provider. Make sure you discuss any questions you have with your health care provider. Document Released: 11/11/2006 Document Revised: 09/13/2015 Document Reviewed: 08/15/2015 Elsevier Interactive Patient Education  2017 [employer]

## 2015-12-25 NOTE — MAU Provider Note (Signed)
History     CSN: [id number]  Arrival date and time: 12/25/15 1505   First Provider Initiated Contact with Patient 12/25/15 1628      Chief Complaint  Patient presents with  . Emesis  . Headache  . Nasal Congestion  . Fatigue   HPI   [name] is a 25 y.o. female G2P1001 @ 6356w4d here in  MAU with nausea/vomiting and burning in her upper chest. She had trouble with N/V at the start of her pregnancy, however it resolved. In the last 3 days she has vomited 3x per day. She has not taken any medication for the symptoms. Each time she has tried to eat something she feels a burning in her chest.   She was able to eat peanut butter soup today. No one else in the house is sick.   OB History    Gravida Para Term Preterm AB Living   2 1 1     1    SAB TAB Ectopic Multiple Live Births         0 1      Past Medical History:  Diagnosis Date  . Medical history non-contributory     Past Surgical History:  Procedure Laterality Date  . NO PAST SURGERIES      History reviewed. No pertinent family history.  Social History  Substance Use Topics  . Smoking status: Never Smoker  . Smokeless tobacco: Never Used  . Alcohol use No    Allergies: No Known Allergies  Prescriptions Prior to Admission  Medication Sig Dispense Refill Last Dose  . ondansetron (ZOFRAN) 8 MG tablet Take 1 tablet (8 mg total) by mouth every 8 (eight) hours as needed for nausea or vomiting. (Patient not taking: Reported on 12/08/2015) 20 tablet 2 Not Taking  . Prenatal Multivit-Min-Fe-FA (PRENATAL VITAMINS) 0.8 MG tablet Take 1 tablet by mouth daily. 30 tablet 12 Taking   Results for orders placed or performed during the hospital encounter of 12/25/15 (from the past 48 hour(s))  Urinalysis, Routine w reflex microscopic ([facility])     Status: Abnormal   Collection Time: 12/25/15  3:45 PM  Result Value Ref Range   Color, Urine YELLOW YELLOW   APPearance CLEAR CLEAR   Specific Gravity, Urine 1.015  1.005 - 1.030   pH 7.0 5.0 - 8.0   Glucose, UA NEGATIVE NEGATIVE mg/dL   Hgb urine dipstick SMALL (A) NEGATIVE   Bilirubin Urine NEGATIVE NEGATIVE   Ketones, ur NEGATIVE NEGATIVE mg/dL   Protein, ur NEGATIVE NEGATIVE mg/dL   Nitrite NEGATIVE NEGATIVE   Leukocytes, UA NEGATIVE NEGATIVE  Urine microscopic-add on     Status: Abnormal   Collection Time: 12/25/15  3:45 PM  Result Value Ref Range   Squamous Epithelial / LPF 0-5 (A) NONE SEEN   WBC, UA NONE SEEN 0 - 5 WBC/hpf   RBC / HPF 0-5 0 - 5 RBC/hpf   Bacteria, UA NONE SEEN NONE SEEN   Urine-Other AMORPHOUS URATES/PHOSPHATES    Review of Systems  Constitutional: Negative for chills and fever.  Gastrointestinal: Positive for heartburn, nausea and vomiting. Negative for abdominal pain.  Genitourinary: Negative for dysuria and urgency.  Neurological: Positive for weakness.   Physical Exam   Blood pressure 117/63, pulse 95, temperature 97.9 F (36.6 C), temperature source Oral, resp. rate 18, height 5' 6.14" (1.68 m), weight 223 lb (101.2 kg), last menstrual period 05/18/2015, SpO2 99 %, currently breastfeeding.  Physical Exam  Constitutional: She is oriented to person,  place, and time. She appears well-developed and well-nourished. No distress.  Musculoskeletal: Normal range of motion.  Neurological: She is alert and oriented to person, place, and time.  Skin: Skin is warm. She is not diaphoretic.  Psychiatric: Her behavior is normal.   Fetal Tracing: Baseline: 125 bpm  Variability: Moderate  Accelerations: 15x15 Decelerations: quick variables  Toco: None   MAU Course  Procedures  None  MDM  -UA  Assessment and Plan   A:  1. Tension-type headache, not intractable, unspecified chronicity pattern   2. Nausea and vomiting in pregnancy   3. Gastroesophageal reflux disease, esophagitis presence not specified     P:  Discharge home in stable condition Rx: Phenergan, Zantac Return to MAU as needed, if symptoms  worsen Ok to take tylenol as directed on the bottle.    Duane LopeJennifer I Mukesh Kornegay, NP 12/25/2015 6:36 PM

## 2015-12-25 NOTE — MAU Note (Signed)
Patient states she has had n/v and cough since THursday, but it has gotten worse in past 3 days.  Denies diarrhea or fever.  States no one else sick at home.  Reports +fetal movement.  Denies vaginal bleeding or discharge.  Vomited x1 today, able to keep some soup down since.

## 2015-12-25 NOTE — MAU Note (Signed)
Urine sent to lab 

## 2015-12-28 ENCOUNTER — Ambulatory Visit (INDEPENDENT_AMBULATORY_CARE_PROVIDER_SITE_OTHER): Payer: Self-pay | Admitting: Obstetrics and Gynecology

## 2015-12-28 DIAGNOSIS — O219 Vomiting of pregnancy, unspecified: Secondary | ICD-10-CM | POA: Insufficient documentation

## 2015-12-28 NOTE — Progress Notes (Signed)
Pt c/o dizziness, vomited x1 this morning

## 2015-12-28 NOTE — Progress Notes (Signed)
Prenatal Visit Note Date: 12/28/2015 Clinic: [facility]  Subjective:  Morgan Mathews is a 25 y.o. G2P1001 at 919w0d being seen today for ongoing prenatal care.  She is currently monitored for the following issues for this high-risk pregnancy and has Mastitis chronic; Breast pain, right; Language barrier to communication; Supervision of other high risk pregnancy, antepartum; Short interval between pregnancies affecting pregnancy in first trimester, antepartum; Poor weight gain of pregnancy; and Nausea and vomiting during pregnancy on her problem list.  Patient reports see nurses note but able to drink and eat okay. .   Contractions: Not present. Vag. Bleeding: None.  Movement: Present. Denies leaking of fluid.   The following portions of the patient's history were reviewed and updated as appropriate: allergies, current medications, past family history, past medical history, past social history, past surgical history and problem list. Problem list updated.  Objective:   Vitals:   12/28/15 1019 12/28/15 1020  BP: (!) 79/46 (!) 105/50  Pulse: (!) 101   Temp: 98.5 F (36.9 C)   Weight: 223 lb (101.2 kg)     Fetal Status: Fetal Heart Rate (bpm): 140 Fundal Height: 33 cm Movement: Present  Presentation: Vertex  General:  Alert, oriented and cooperative. Patient is in no acute distress.  Skin: Skin is warm and dry. No rash noted.   Cardiovascular: Normal heart rate noted  Respiratory: Normal respiratory effort, no problems with respiration noted  Abdomen: Soft, gravid, appropriate for gestational age. Pain/Pressure: Present     Pelvic:  Cervical exam deferred        Extremities: Normal range of motion.  Edema: None  Mental Status: Normal mood and affect. Normal behavior. Normal judgment and thought content.   Urinalysis:      Assessment and Plan:  Pregnancy: G2P1001 at 309w0d  1. OB Chronic nausea and vomiting of pregnancy but good weight gain and FH. BC options  d/w pt and pamphlet given. Normal 28wk labs. Interpreter used  Preterm labor symptoms and general obstetric precautions including but not limited to vaginal bleeding, contractions, leaking of fluid and fetal movement were reviewed in detail with the patient. Please refer to After Visit Summary for other counseling recommendations.  Return in about 2 weeks (around 01/11/2016).   Crete Bingharlie Kayonna Lawniczak, MD

## 2016-01-11 ENCOUNTER — Ambulatory Visit (INDEPENDENT_AMBULATORY_CARE_PROVIDER_SITE_OTHER): Payer: Self-pay | Admitting: Student

## 2016-01-11 VITALS — BP 116/62 | HR 112 | Wt 225.6 lb

## 2016-01-11 DIAGNOSIS — O26893 Other specified pregnancy related conditions, third trimester: Secondary | ICD-10-CM

## 2016-01-11 DIAGNOSIS — R12 Heartburn: Secondary | ICD-10-CM

## 2016-01-11 DIAGNOSIS — Z789 Other specified health status: Secondary | ICD-10-CM

## 2016-01-11 DIAGNOSIS — O09899 Supervision of other high risk pregnancies, unspecified trimester: Secondary | ICD-10-CM

## 2016-01-11 MED ORDER — PANTOPRAZOLE SODIUM 40 MG PO TBEC: 40.0000 mg | DELAYED_RELEASE_TABLET | Freq: Every day | ORAL | 0 refills | Status: DC

## 2016-01-11 NOTE — Patient Instructions (Addendum)
Third Trimester of Pregnancy The third trimester is from week 29 through week 42, months 7 through 9. This trimester is when your unborn baby (fetus) is growing very fast. At the end of the ninth month, the unborn baby is about 20 inches in length. It weighs about 6-10 pounds. Follow these instructions at home:  Avoid all smoking, herbs, and alcohol. Avoid drugs not approved by your doctor.  Do not use any tobacco products, including cigarettes, chewing tobacco, and electronic cigarettes. If you need help quitting, ask your doctor. You may get counseling or other support to help you quit.  Only take medicine as told by your doctor. Some medicines are safe and some are not during pregnancy.  Exercise only as told by your doctor. Stop exercising if you start having cramps.  Eat regular, healthy meals.  Wear a good support bra if your breasts are tender.  Do not use hot tubs, steam rooms, or saunas.  Wear your seat belt when driving.  Avoid raw meat, uncooked cheese, and liter boxes and soil used by cats.  Take your prenatal vitamins.  Take 1500-2000 milligrams of calcium daily starting at the 20th week of pregnancy until you deliver your baby.  Try taking medicine that helps you poop (stool softener) as needed, and if your doctor approves. Eat more fiber by eating fresh fruit, vegetables, and whole grains. Drink enough fluids to keep your pee (urine) clear or pale yellow.  Take warm water baths (sitz baths) to soothe pain or discomfort caused by hemorrhoids. Use hemorrhoid cream if your doctor approves.  If you have puffy, bulging veins (varicose veins), wear support hose. Raise (elevate) your feet for 15 minutes, 3-4 times a day. Limit salt in your diet.  Avoid heavy lifting, wear low heels, and sit up straight.  Rest with your legs raised if you have leg cramps or low back pain.  Visit your dentist if you have not gone during your pregnancy. Use a soft toothbrush to brush your  teeth. Be gentle when you floss.  You can have sex (intercourse) unless your doctor tells you not to.  Do not travel far distances unless you must. Only do so with your doctor's approval.  Take prenatal classes.  Practice driving to the hospital.  Pack your hospital bag.  Prepare the baby's room.  Go to your doctor visits. Get help if:  You are not sure if you are in labor or if your water has broken.  You are dizzy.  You have mild cramps or pressure in your lower belly (abdominal).  You have a nagging pain in your belly area.  You continue to feel sick to your stomach (nauseous), throw up (vomit), or have watery poop (diarrhea).  You have bad smelling fluid coming from your vagina.  You have pain with peeing (urination). Get help right away if:  You have a fever.  You are leaking fluid from your vagina.  You are spotting or bleeding from your vagina.  You have severe belly cramping or pain.  You lose or gain weight rapidly.  You have trouble catching your breath and have chest pain.  You notice sudden or extreme puffiness (swelling) of your face, hands, ankles, feet, or legs.  You have not felt the baby move in over an hour.  You have severe headaches that do not go away with medicine.  You have vision changes. This information is not intended to replace advice given to you by your health care provider. Make   sure you discuss any questions you have with your health care provider. Document Released: 04/10/2009 Document Revised: 06/22/2015 Document Reviewed: 03/17/2012 Elsevier Interactive Patient Education  2017 [employer]  Heartburn During Pregnancy Heartburn is pain or discomfort in the throat or chest. It may cause a burning feeling. It happens when stomach acid moves up into the tube that carries food from your mouth to your stomach (esophagus). Heartburn is common during pregnancy. It usually goes away or gets better after giving birth. Follow these  instructions at home: Eating and drinking  Do not drink alcohol while you are pregnant.  Figure out which foods and beverages make you feel worse, and avoid them.  Beverages that you may want to avoid include: ? Coffee and tea (with or without caffeine). ? Energy drinks and sports drinks. ? Bubbly (carbonated) drinks or sodas. ? Citrus fruit juices.  Foods that you may want to avoid include: ? Chocolate and cocoa. ? Peppermint and mint flavorings. ? Garlic, onions, and horseradish. ? Spicy and acidic foods. These include peppers, chili powder, curry powder, vinegar, hot sauces, and barbecue sauce. ? Citrus fruits, such as oranges, lemons, and limes. ? Tomato-based foods, such as red sauce, chili, and salsa. ? Fried and fatty foods, such as donuts, french fries, potato chips, and high-fat dressings. ? High-fat meats, such as hot dogs, cold cuts, sausage, ham, and bacon. ? High-fat dairy items, such as whole milk, butter, and cheese.  Eat small meals often, instead of large meals.  Avoid drinking a lot of liquid with your meals.  Avoid eating meals during the 2-3 hours before you go to bed.  Avoid lying down right after you eat.  Do not exercise right after you eat. Medicines  Take over-the-counter and prescription medicines only as told by your doctor.  Do not take aspirin, ibuprofen, or other NSAIDs unless your doctor tells you to do that.  Your doctor may tell you to avoid medicines that have sodium bicarbonate in them. General instructions  If told, raise the head of your bed about 6 inches (15 cm). You can do this by putting blocks under the legs. Sleeping with more pillows does not help with heartburn.  Do not use any products that contain nicotine or tobacco, such as cigarettes and e-cigarettes. If you need help quitting, ask your doctor.  Wear loose-fitting clothing.  Try to lower your stress, such as with yoga or meditation. If you need help, ask your  doctor.  Stay at a healthy weight. If you are overweight, work with your doctor to safely lose weight.  Keep all follow-up visits as told by your doctor. This is important. Contact a doctor if:  You get new symptoms.  Your symptoms do not get better with treatment.  You have weight loss and you do not know why.  You have trouble swallowing.  You make loud sounds when you breathe (wheeze).  You have a cough that does not go away.  You have heartburn often for more than 2 weeks.  You feel sick to your stomach (nauseous), and this does not get better with treatment.  You are throwing up (vomiting), and this does not get better with treatment.  You have pain in your belly (abdomen). Get help right away if:  You have very bad chest pain that spreads to your arm, neck, or jaw.  You feel sweaty, dizzy, or light-headed.  You have trouble breathing.  You have pain when swallowing.  You throw up and your   throw-up looks like blood or coffee grounds.  Your poop (stool) is bloody or black. This information is not intended to replace advice given to you by your health care provider. Make sure you discuss any questions you have with your health care provider. Document Released: 02/16/2010 Document Revised: 10/02/2015 Document Reviewed: 10/02/2015 Elsevier Interactive Patient Education  2017 [employer]  

## 2016-01-11 NOTE — Progress Notes (Signed)
   PRENATAL VISIT NOTE  Subjective:  Morgan Mathews is a 25 y.o. G2P1001 at 6225w0d being seen today for ongoing prenatal care.  She is currently monitored for the following issues for this high-risk pregnancy and has Mastitis chronic; Breast pain, right; Language barrier to communication; Supervision of other high risk pregnancy, antepartum; Short interval between pregnancies affecting pregnancy in first trimester, antepartum; Poor weight gain of pregnancy; and Nausea and vomiting during pregnancy on her problem list.  Patient reports heartburn. Nausea & vomiting has improved since previous visits but heartburn has remained. Has been taking zantac BID since last month without relief.  Contractions: Not present.  .  Movement: Present. Denies leaking of fluid.   The following portions of the patient's history were reviewed and updated as appropriate: allergies, current medications, past family history, past medical history, past social history, past surgical history and problem list. Problem list updated.  Objective:   Vitals:   01/11/16 1339  BP: 116/62  Pulse: (!) 112  Weight: 225 lb 9.6 oz (102.3 kg)    Fetal Status: Fetal Heart Rate (bpm): 146 Fundal Height: 35 cm Movement: Present     General:  Alert, oriented and cooperative. Patient is in no acute distress.  Skin: Skin is warm and dry. No rash noted.   Cardiovascular: Normal heart rate noted  Respiratory: Normal respiratory effort, no problems with respiration noted  Abdomen: Soft, gravid, appropriate for gestational age. Pain/Pressure: Present     Pelvic:  Cervical exam deferred        Extremities: Normal range of motion.  Edema: None  Mental Status: Normal mood and affect. Normal behavior. Normal judgment and thought content.   Assessment and Plan:  Pregnancy: G2P1001 at 3525w0d  1. Supervision of other high risk pregnancy, antepartum   2. Language barrier to communication -pt refused interpreter today  3. Heartburn during  pregnancy in third trimester -D/c zantac - pantoprazole (PROTONIX) 40 MG tablet; Take 1 tablet (40 mg total) by mouth daily.  Dispense: 30 tablet; Refill: 0  Preterm labor symptoms and general obstetric precautions including but not limited to vaginal bleeding, contractions, leaking of fluid and fetal movement were reviewed in detail with the patient. Please refer to After Visit Summary for other counseling recommendations.  Return in about 2 weeks (around 01/25/2016) for Routine OB.   Judeth HornErin Vonette Grosso, NP

## 2016-01-29 NOTE — L&D Delivery Note (Signed)
Called to Patient room with concern for rapid descent. On arrival heart rate in the 100's quickly drops to 70's. Difficult to trace. FSE placed and Heart rate found to be in 50's. Cervix reduced with patient pushing.    Operative Delivery Note At 6:31 PM a viable female was delivered via Vaginal, Vacuum Investment banker, operational(Extractor).  Presentation: vertex; Position: Right,, Occiput,, Anterior; Station: +2.  Verbal consent: obtained from patient.  Risks and benefits discussed in detail.  Risks include, but are not limited to the risks of anesthesia, bleeding, infection, damage to maternal tissues, fetal cephalhematoma.  There is also the risk of inability to effect vaginal delivery of the head, or shoulder dystocia that cannot be resolved by established maneuvers, leading to the need for emergency cesarean section.  Delivery of head with turtle sign. Tight nuccal cord times 2. Un able to reduce. Clamped and cut on perineum.   Shoulder dystocia for 45 seconds delivered mcroberts maneuver and suprapubic pressure.  APGAR: pending weight  pending.   Placenta status: delivered intact with gentle traction .   Cord: 3 vessels with the following complications: nuchal x2 clamped and cut on perineum.  Cord pH: 7.11  Anesthesia:  none Instruments: Kiwi Vaccuum Episiotomy: None Lacerations: Periurethral Est. Blood Loss (mL): 250  Mom to postpartum.  Baby to Couplet care / Skin to Skin.  Ernestina Pennaicholas Requan Hardge 02/12/2016, 6:54 PM

## 2016-01-31 ENCOUNTER — Other Ambulatory Visit: Payer: Self-pay | Admitting: *Deleted

## 2016-01-31 MED ORDER — RANITIDINE HCL 150 MG PO TABS: 150.0000 mg | ORAL_TABLET | Freq: Two times a day (BID) | ORAL | 1 refills | Status: DC

## 2016-01-31 NOTE — Progress Notes (Signed)
Zantac Refilled per [place].

## 2016-02-01 ENCOUNTER — Ambulatory Visit (INDEPENDENT_AMBULATORY_CARE_PROVIDER_SITE_OTHER): Payer: Self-pay | Admitting: Family Medicine

## 2016-02-01 DIAGNOSIS — Z113 Encounter for screening for infections with a predominantly sexual mode of transmission: Secondary | ICD-10-CM | POA: Diagnosis present

## 2016-02-01 DIAGNOSIS — O09893 Supervision of other high risk pregnancies, third trimester: Secondary | ICD-10-CM

## 2016-02-01 DIAGNOSIS — O09899 Supervision of other high risk pregnancies, unspecified trimester: Secondary | ICD-10-CM

## 2016-02-01 LAB — OB RESULTS CONSOLE GBS: STREP GROUP B AG: NEGATIVE

## 2016-02-01 LAB — OB RESULTS CONSOLE GC/CHLAMYDIA: GC PROBE AMP, GENITAL: NEGATIVE

## 2016-02-01 NOTE — Patient Instructions (Signed)
Third Trimester of Pregnancy The third trimester is from week 29 through week 40 (months 7 through 9). The third trimester is a time when the unborn baby (fetus) is growing rapidly. At the end of the ninth month, the fetus is about 20 inches in length and weighs 6-10 pounds. Body changes during your third trimester Your body goes through many changes during pregnancy. The changes vary from woman to woman. During the third trimester:  Your weight will continue to increase. You can expect to gain 25-35 pounds (11-16 kg) by the end of the pregnancy.  You may begin to get stretch marks on your hips, abdomen, and breasts.  You may urinate more often because the fetus is moving lower into your pelvis and pressing on your bladder.  You may develop or continue to have heartburn. This is caused by increased hormones that slow down muscles in the digestive tract.  You may develop or continue to have constipation because increased hormones slow digestion and cause the muscles that push waste through your intestines to relax.  You may develop hemorrhoids. These are swollen veins (varicose veins) in the rectum that can itch or be painful.  You may develop swollen, bulging veins (varicose veins) in your legs.  You may have increased body aches in the pelvis, back, or thighs. This is due to weight gain and increased hormones that are relaxing your joints.  You may have changes in your hair. These can include thickening of your hair, rapid growth, and changes in texture. Some women also have hair loss during or after pregnancy, or hair that feels dry or thin. Your hair will most likely return to normal after your baby is born.  Your breasts will continue to grow and they will continue to become tender. A yellow fluid (colostrum) may leak from your breasts. This is the first milk you are producing for your baby.  Your belly button may stick out.  You may notice more swelling in your hands, face, or  ankles.  You may have increased tingling or numbness in your hands, arms, and legs. The skin on your belly may also feel numb.  You may feel short of breath because of your expanding uterus.  You may have more problems sleeping. This can be caused by the size of your belly, increased need to urinate, and an increase in your body's metabolism.  You may notice the fetus "dropping," or moving lower in your abdomen.  You may have increased vaginal discharge.  Your cervix becomes thin and soft (effaced) near your due date. What to expect at prenatal visits You will have prenatal exams every 2 weeks until week 36. Then you will have weekly prenatal exams. During a routine prenatal visit:  You will be weighed to make sure you and the fetus are growing normally.  Your blood pressure will be taken.  Your abdomen will be measured to track your baby's growth.  The fetal heartbeat will be listened to.  Any test results from the previous visit will be discussed.  You may have a cervical check near your due date to see if you have effaced. At around 36 weeks, your health care provider will check your cervix. At the same time, your health care provider will also perform a test on the secretions of the vaginal tissue. This test is to determine if a type of bacteria, Group B streptococcus, is present. Your health care provider will explain this further. Your health care provider may ask you:    What your birth plan is.  How you are feeling.  If you are feeling the baby move.  If you have had any abnormal symptoms, such as leaking fluid, bleeding, severe headaches, or abdominal cramping.  If you are using any tobacco products, including cigarettes, chewing tobacco, and electronic cigarettes.  If you have any questions. Other tests or screenings that may be performed during your third trimester include:  Blood tests that check for low iron levels (anemia).  Fetal testing to check the health,  activity level, and growth of the fetus. Testing is done if you have certain medical conditions or if there are problems during the pregnancy.  Nonstress test (NST). This test checks the health of your baby to make sure there are no signs of problems, such as the baby not getting enough oxygen. During this test, a belt is placed around your belly. The baby is made to move, and its heart rate is monitored during movement. What is false labor? False labor is a condition in which you feel small, irregular tightenings of the muscles in the womb (contractions) that eventually go away. These are called Braxton Hicks contractions. Contractions may last for hours, days, or even weeks before true labor sets in. If contractions come at regular intervals, become more frequent, increase in intensity, or become painful, you should see your health care provider. What are the signs of labor?  Abdominal cramps.  Regular contractions that start at 10 minutes apart and become stronger and more frequent with time.  Contractions that start on the top of the uterus and spread down to the lower abdomen and back.  Increased pelvic pressure and dull back pain.  A watery or bloody mucus discharge that comes from the vagina.  Leaking of amniotic fluid. This is also known as your "water breaking." It could be a slow trickle or a gush. Let your doctor know if it has a color or strange odor. If you have any of these signs, call your health care provider right away, even if it is before your due date. Follow these instructions at home: Eating and drinking  Continue to eat regular, healthy meals.  Do not eat:  Raw meat or meat spreads.  Unpasteurized milk or cheese.  Unpasteurized juice.  Store-made salad.  Refrigerated smoked seafood.  Hot dogs or deli meat, unless they are piping hot.  More than 6 ounces of albacore tuna a week.  Shark, swordfish, king mackerel, or tile fish.  Store-made salads.  Raw  sprouts, such as mung bean or alfalfa sprouts.  Take prenatal vitamins as told by your health care provider.  Take 1000 mg of calcium daily as told by your health care provider.  If you develop constipation:  Take over-the-counter or prescription medicines.  Drink enough fluid to keep your urine clear or pale yellow.  Eat foods that are high in fiber, such as fresh fruits and vegetables, whole grains, and beans.  Limit foods that are high in fat and processed sugars, such as fried and sweet foods. Activity  Exercise only as directed by your health care provider. Healthy pregnant women should aim for 2 hours and 30 minutes of moderate exercise per week. If you experience any pain or discomfort while exercising, stop.  Avoid heavy lifting.  Do not exercise in extreme heat or humidity, or at high altitudes.  Wear low-heel, comfortable shoes.  Practice good posture.  Do not travel far distances unless it is absolutely necessary and only with the approval   of your health care provider.  Wear your seat belt at all times while in a car, on a bus, or on a plane.  Take frequent breaks and rest with your legs elevated if you have leg cramps or low back pain.  Do not use hot tubs, steam rooms, or saunas.  You may continue to have sex unless your health care provider tells you otherwise. Lifestyle  Do not use any products that contain nicotine or tobacco, such as cigarettes and e-cigarettes. If you need help quitting, ask your health care provider.  Do not drink alcohol.  Do not use any medicinal herbs or unprescribed drugs. These chemicals affect the formation and growth of the baby.  If you develop varicose veins:  Wear support pantyhose or compression stockings as told by your healthcare provider.  Elevate your feet for 15 minutes, 3-4 times a day.  Wear a supportive maternity bra to help with breast tenderness. General instructions  Take over-the-counter and prescription  medicines only as told by your health care provider. There are medicines that are either safe or unsafe to take during pregnancy.  Take warm sitz baths to soothe any pain or discomfort caused by hemorrhoids. Use hemorrhoid cream or witch hazel if your health care provider approves.  Avoid cat litter boxes and soil used by cats. These carry germs that can cause birth defects in the baby. If you have a cat, ask someone to clean the litter box for you.  To prepare for the arrival of your baby:  Take prenatal classes to understand, practice, and ask questions about the labor and delivery.  Make a trial run to the hospital.  Visit the hospital and tour the maternity area.  Arrange for maternity or paternity leave through employers.  Arrange for family and friends to take care of pets while you are in the hospital.  Purchase a rear-facing car seat and make sure you know how to install it in your car.  Pack your hospital bag.  Prepare the baby's nursery. Make sure to remove all pillows and stuffed animals from the baby's crib to prevent suffocation.  Visit your dentist if you have not gone during your pregnancy. Use a soft toothbrush to brush your teeth and be gentle when you floss.  Keep all prenatal follow-up visits as told by your health care provider. This is important. Contact a health care provider if:  You are unsure if you are in labor or if your water has broken.  You become dizzy.  You have mild pelvic cramps, pelvic pressure, or nagging pain in your abdominal area.  You have lower back pain.  You have persistent nausea, vomiting, or diarrhea.  You have an unusual or bad smelling vaginal discharge.  You have pain when you urinate. Get help right away if:  You have a fever.  You are leaking fluid from your vagina.  You have spotting or bleeding from your vagina.  You have severe abdominal pain or cramping.  You have rapid weight loss or weight gain.  You have  shortness of breath with chest pain.  You notice sudden or extreme swelling of your face, hands, ankles, feet, or legs.  Your baby makes fewer than 10 movements in 2 hours.  You have severe headaches that do not go away with medicine.  You have vision changes. Summary  The third trimester is from week 29 through week 40, months 7 through 9. The third trimester is a time when the unborn baby (fetus)   is growing rapidly.  During the third trimester, your discomfort may increase as you and your baby continue to gain weight. You may have abdominal, leg, and back pain, sleeping problems, and an increased need to urinate.  During the third trimester your breasts will keep growing and they will continue to become tender. A yellow fluid (colostrum) may leak from your breasts. This is the first milk you are producing for your baby.  False labor is a condition in which you feel small, irregular tightenings of the muscles in the womb (contractions) that eventually go away. These are called Braxton Hicks contractions. Contractions may last for hours, days, or even weeks before true labor sets in.  Signs of labor can include: abdominal cramps; regular contractions that start at 10 minutes apart and become stronger and more frequent with time; watery or bloody mucus discharge that comes from the vagina; increased pelvic pressure and dull back pain; and leaking of amniotic fluid. This information is not intended to replace advice given to you by your health care provider. Make sure you discuss any questions you have with your health care provider. Document Released: 01/08/2001 Document Revised: 06/22/2015 Document Reviewed: 03/17/2012 [employer] Interactive Patient Education  2017 [employer]   Breastfeeding Deciding to breastfeed is one of the best choices you can make for you and your baby. A change in hormones during pregnancy causes your breast tissue to grow and increases the number and size of your  milk ducts. These hormones also allow proteins, sugars, and fats from your blood supply to make breast milk in your milk-producing glands. Hormones prevent breast milk from being released before your baby is born as well as prompt milk flow after birth. Once breastfeeding has begun, thoughts of your baby, as well as his or her sucking or crying, can stimulate the release of milk from your milk-producing glands. Benefits of breastfeeding For Your Baby  Your first milk (colostrum) helps your baby's digestive system function better.  There are antibodies in your milk that help your baby fight off infections.  Your baby has a lower incidence of asthma, allergies, and sudden infant death syndrome.  The nutrients in breast milk are better for your baby than infant formulas and are designed uniquely for your baby's needs.  Breast milk improves your baby's brain development.  Your baby is less likely to develop other conditions, such as childhood obesity, asthma, or type 2 diabetes mellitus. For You  Breastfeeding helps to create a very special bond between you and your baby.  Breastfeeding is convenient. Breast milk is always available at the correct temperature and costs nothing.  Breastfeeding helps to burn calories and helps you lose the weight gained during pregnancy.  Breastfeeding makes your uterus contract to its prepregnancy size faster and slows bleeding (lochia) after you give birth.  Breastfeeding helps to lower your risk of developing type 2 diabetes mellitus, osteoporosis, and breast or ovarian cancer later in life. Signs that your baby is hungry Early Signs of Hunger  Increased alertness or activity.  Stretching.  Movement of the head from side to side.  Movement of the head and opening of the mouth when the corner of the mouth or cheek is stroked (rooting).  Increased sucking sounds, smacking lips, cooing, sighing, or squeaking.  Hand-to-mouth movements.  Increased  sucking of fingers or hands. Late Signs of Hunger  Fussing.  Intermittent crying. Extreme Signs of Hunger  Signs of extreme hunger will require calming and consoling before your baby will   be able to breastfeed successfully. Do not wait for the following signs of extreme hunger to occur before you initiate breastfeeding:  Restlessness.  A loud, strong cry.  Screaming. Breastfeeding basics  Breastfeeding Initiation  Find a comfortable place to sit or lie down, with your neck and back well supported.  Place a pillow or rolled up blanket under your baby to bring him or her to the level of your breast (if you are seated). Nursing pillows are specially designed to help support your arms and your baby while you breastfeed.  Make sure that your baby's abdomen is facing your abdomen.  Gently massage your breast. With your fingertips, massage from your chest wall toward your nipple in a circular motion. This encourages milk flow. You may need to continue this action during the feeding if your milk flows slowly.  Support your breast with 4 fingers underneath and your thumb above your nipple. Make sure your fingers are well away from your nipple and your baby's mouth.  Stroke your baby's lips gently with your finger or nipple.  When your baby's mouth is open wide enough, quickly bring your baby to your breast, placing your entire nipple and as much of the colored area around your nipple (areola) as possible into your baby's mouth.  More areola should be visible above your baby's upper lip than below the lower lip.  Your baby's tongue should be between his or her lower gum and your breast.  Ensure that your baby's mouth is correctly positioned around your nipple (latched). Your baby's lips should create a seal on your breast and be turned out (everted).  It is common for your baby to suck about 2-3 minutes in order to start the flow of breast milk. Latching  Teaching your baby how to latch  on to your breast properly is very important. An improper latch can cause nipple pain and decreased milk supply for you and poor weight gain in your baby. Also, if your baby is not latched onto your nipple properly, he or she may swallow some air during feeding. This can make your baby fussy. Burping your baby when you switch breasts during the feeding can help to get rid of the air. However, teaching your baby to latch on properly is still the best way to prevent fussiness from swallowing air while breastfeeding. Signs that your baby has successfully latched on to your nipple:  Silent tugging or silent sucking, without causing you pain.  Swallowing heard between every 3-4 sucks.  Muscle movement above and in front of his or her ears while sucking. Signs that your baby has not successfully latched on to nipple:  Sucking sounds or smacking sounds from your baby while breastfeeding.  Nipple pain. If you think your baby has not latched on correctly, slip your finger into the corner of your baby's mouth to break the suction and place it between your baby's gums. Attempt breastfeeding initiation again. Signs of Successful Breastfeeding  Signs from your baby:  A gradual decrease in the number of sucks or complete cessation of sucking.  Falling asleep.  Relaxation of his or her body.  Retention of a small amount of milk in his or her mouth.  Letting go of your breast by himself or herself. Signs from you:  Breasts that have increased in firmness, weight, and size 1-3 hours after feeding.  Breasts that are softer immediately after breastfeeding.  Increased milk volume, as well as a change in milk consistency and color by   the fifth day of breastfeeding.  Nipples that are not sore, cracked, or bleeding. Signs That Your Baby is Getting Enough Milk  Wetting at least 1-2 diapers during the first 24 hours after birth.  Wetting at least 5-6 diapers every 24 hours for the first week after  birth. The urine should be clear or pale yellow by 5 days after birth.  Wetting 6-8 diapers every 24 hours as your baby continues to grow and develop.  At least 3 stools in a 24-hour period by age 5 days. The stool should be soft and yellow.  At least 3 stools in a 24-hour period by age 7 days. The stool should be seedy and yellow.  No loss of weight greater than 10% of birth weight during the first 3 days of age.  Average weight gain of 4-7 ounces (113-198 g) per week after age 4 days.  Consistent daily weight gain by age 5 days, without weight loss after the age of 2 weeks. After a feeding, your baby may spit up a small amount. This is common. Breastfeeding frequency and duration Frequent feeding will help you make more milk and can prevent sore nipples and breast engorgement. Breastfeed when you feel the need to reduce the fullness of your breasts or when your baby shows signs of hunger. This is called "breastfeeding on demand." Avoid introducing a pacifier to your baby while you are working to establish breastfeeding (the first 4-6 weeks after your baby is born). After this time you may choose to use a pacifier. Research has shown that pacifier use during the first year of a baby's life decreases the risk of sudden infant death syndrome (SIDS). Allow your baby to feed on each breast as long as he or she wants. Breastfeed until your baby is finished feeding. When your baby unlatches or falls asleep while feeding from the first breast, offer the second breast. Because newborns are often sleepy in the first few weeks of life, you may need to awaken your baby to get him or her to feed. Breastfeeding times will vary from baby to baby. However, the following rules can serve as a guide to help you ensure that your baby is properly fed:  Newborns (babies 4 weeks of age or younger) may breastfeed every 1-3 hours.  Newborns should not go longer than 3 hours during the day or 5 hours during the night  without breastfeeding.  You should breastfeed your baby a minimum of 8 times in a 24-hour period until you begin to introduce solid foods to your baby at around 6 months of age. Breast milk pumping Pumping and storing breast milk allows you to ensure that your baby is exclusively fed your breast milk, even at times when you are unable to breastfeed. This is especially important if you are going back to work while you are still breastfeeding or when you are not able to be present during feedings. Your lactation consultant can give you guidelines on how long it is safe to store breast milk. A breast pump is a machine that allows you to pump milk from your breast into a sterile bottle. The pumped breast milk can then be stored in a refrigerator or freezer. Some breast pumps are operated by hand, while others use electricity. Ask your lactation consultant which type will work best for you. Breast pumps can be purchased, but some hospitals and breastfeeding support groups lease breast pumps on a monthly basis. A lactation consultant can teach you how to   hand express breast milk, if you prefer not to use a pump. Caring for your breasts while you breastfeed Nipples can become dry, cracked, and sore while breastfeeding. The following recommendations can help keep your breasts moisturized and healthy:  Avoid using soap on your nipples.  Wear a supportive bra. Although not required, special nursing bras and tank tops are designed to allow access to your breasts for breastfeeding without taking off your entire bra or top. Avoid wearing underwire-style bras or extremely tight bras.  Air dry your nipples for 3-4minutes after each feeding.  Use only cotton bra pads to absorb leaked breast milk. Leaking of breast milk between feedings is normal.  Use lanolin on your nipples after breastfeeding. Lanolin helps to maintain your skin's normal moisture barrier. If you use pure lanolin, you do not need to wash it off  before feeding your baby again. Pure lanolin is not toxic to your baby. You may also hand express a few drops of breast milk and gently massage that milk into your nipples and allow the milk to air dry. In the first few weeks after giving birth, some women experience extremely full breasts (engorgement). Engorgement can make your breasts feel heavy, warm, and tender to the touch. Engorgement peaks within 3-5 days after you give birth. The following recommendations can help ease engorgement:  Completely empty your breasts while breastfeeding or pumping. You may want to start by applying warm, moist heat (in the shower or with warm water-soaked hand towels) just before feeding or pumping. This increases circulation and helps the milk flow. If your baby does not completely empty your breasts while breastfeeding, pump any extra milk after he or she is finished.  Wear a snug bra (nursing or regular) or tank top for 1-2 days to signal your body to slightly decrease milk production.  Apply ice packs to your breasts, unless this is too uncomfortable for you.  Make sure that your baby is latched on and positioned properly while breastfeeding. If engorgement persists after 48 hours of following these recommendations, contact your health care provider or a lactation consultant. Overall health care recommendations while breastfeeding  Eat healthy foods. Alternate between meals and snacks, eating 3 of each per day. Because what you eat affects your breast milk, some of the foods may make your baby more irritable than usual. Avoid eating these foods if you are sure that they are negatively affecting your baby.  Drink milk, fruit juice, and water to satisfy your thirst (about 10 glasses a day).  Rest often, relax, and continue to take your prenatal vitamins to prevent fatigue, stress, and anemia.  Continue breast self-awareness checks.  Avoid chewing and smoking tobacco. Chemicals from cigarettes that pass  into breast milk and exposure to secondhand smoke may harm your baby.  Avoid alcohol and drug use, including marijuana. Some medicines that may be harmful to your baby can pass through breast milk. It is important to ask your health care provider before taking any medicine, including all over-the-counter and prescription medicine as well as vitamin and herbal supplements. It is possible to become pregnant while breastfeeding. If birth control is desired, ask your health care provider about options that will be safe for your baby. Contact a health care provider if:  You feel like you want to stop breastfeeding or have become frustrated with breastfeeding.  You have painful breasts or nipples.  Your nipples are cracked or bleeding.  Your breasts are red, tender, or warm.  You have   a swollen area on either breast.  You have a fever or chills.  You have nausea or vomiting.  You have drainage other than breast milk from your nipples.  Your breasts do not become full before feedings by the fifth day after you give birth.  You feel sad and depressed.  Your baby is too sleepy to eat well.  Your baby is having trouble sleeping.  Your baby is wetting less than 3 diapers in a 24-hour period.  Your baby has less than 3 stools in a 24-hour period.  Your baby's skin or the white part of his or her eyes becomes yellow.  Your baby is not gaining weight by 5 days of age. Get help right away if:  Your baby is overly tired (lethargic) and does not want to wake up and feed.  Your baby develops an unexplained fever. This information is not intended to replace advice given to you by your health care provider. Make sure you discuss any questions you have with your health care provider. Document Released: 01/14/2005 Document Revised: 06/28/2015 Document Reviewed: 07/08/2012 Elsevier Interactive Patient Education  2017 Elsevier Inc.  

## 2016-02-01 NOTE — Progress Notes (Signed)
Pt states that she is tired.

## 2016-02-01 NOTE — Progress Notes (Signed)
   PRENATAL VISIT NOTE  Subjective:  Morgan Mathews is a 26 y.o. G2P1001 at 3648w0d being seen today for ongoing prenatal care.  She is currently monitored for the following issues for this low-risk pregnancy and has Mastitis chronic; Breast pain, right; Language barrier to communication; Supervision of other high risk pregnancy, antepartum; Short interval between pregnancies affecting pregnancy in first trimester, antepartum; Poor weight gain of pregnancy; and Nausea and vomiting during pregnancy on her problem list.  Patient reports no complaints.  Contractions: Not present. Vag. Bleeding: None.  Movement: Present. Denies leaking of fluid.   The following portions of the patient's history were reviewed and updated as appropriate: allergies, current medications, past family history, past medical history, past social history, past surgical history and problem list. Problem list updated.  Objective:   Vitals:   02/01/16 1551  BP: 121/72  Pulse: (!) 118  Weight: 230 lb (104.3 kg)    Fetal Status: Fetal Heart Rate (bpm): 140 Fundal Height: 37 cm Movement: Present  Presentation: Vertex  General:  Alert, oriented and cooperative. Patient is in no acute distress.  Skin: Skin is warm and dry. No rash noted.   Cardiovascular: Normal heart rate noted  Respiratory: Normal respiratory effort, no problems with respiration noted  Abdomen: Soft, gravid, appropriate for gestational age. Pain/Pressure: Present     Pelvic:  Cervical exam performed Dilation: Closed Effacement (%): Thick Station: Ballotable  Extremities: Normal range of motion.  Edema: None  Mental Status: Normal mood and affect. Normal behavior. Normal judgment and thought content.   Assessment and Plan:  Pregnancy: G2P1001 at 10748w0d  1. Supervision of other high risk pregnancy, antepartum Continue routine prenatal care. - GC/Chlamydia probe amp ([place] - Culture, beta strep (group b only)  Term labor symptoms and  general obstetric precautions including but not limited to vaginal bleeding, contractions, leaking of fluid and fetal movement were reviewed in detail with the patient. Please refer to After Visit Summary for other counseling recommendations.  Return in 1 week (on 02/08/2016).   Reva Boresanya S Hutchinson Isenberg, MD

## 2016-02-03 LAB — CULTURE, BETA STREP (GROUP B ONLY)

## 2016-02-05 ENCOUNTER — Encounter (HOSPITAL_COMMUNITY): Payer: Self-pay

## 2016-02-05 ENCOUNTER — Inpatient Hospital Stay (HOSPITAL_COMMUNITY)
Admission: AD | Admit: 2016-02-05 | Discharge: 2016-02-05 | Disposition: A | Discharge status: Home / Self Care | Payer: Medicaid Other | Source: Ambulatory Visit | Attending: Obstetrics and Gynecology | Admitting: Obstetrics and Gynecology

## 2016-02-05 DIAGNOSIS — Z3A38 38 weeks gestation of pregnancy: Secondary | ICD-10-CM | POA: Insufficient documentation

## 2016-02-05 DIAGNOSIS — O09899 Supervision of other high risk pregnancies, unspecified trimester: Secondary | ICD-10-CM

## 2016-02-05 DIAGNOSIS — O09891 Supervision of other high risk pregnancies, first trimester: Secondary | ICD-10-CM

## 2016-02-05 DIAGNOSIS — Z3483 Encounter for supervision of other normal pregnancy, third trimester: Secondary | ICD-10-CM | POA: Insufficient documentation

## 2016-02-05 LAB — GC/CHLAMYDIA PROBE AMP (~~LOC~~) NOT AT ARMC
Chlamydia: NEGATIVE
Neisseria Gonorrhea: NEGATIVE

## 2016-02-05 NOTE — MAU Note (Signed)
Recheck patient in 1 hour per CNM 

## 2016-02-05 NOTE — MAU Note (Signed)
Pt reports vaginal bleeding that started around midnight. Noticed some leaking yesterday. Having some contractions. +FM. Cervix was checked but closed last week.

## 2016-02-06 ENCOUNTER — Inpatient Hospital Stay (HOSPITAL_COMMUNITY): Payer: Medicaid Other

## 2016-02-06 ENCOUNTER — Encounter (HOSPITAL_COMMUNITY): Payer: Self-pay

## 2016-02-06 ENCOUNTER — Inpatient Hospital Stay (HOSPITAL_COMMUNITY)
Admission: AD | Admit: 2016-02-06 | Discharge: 2016-02-06 | Disposition: A | Discharge status: Home / Self Care | Payer: Medicaid Other | Source: Ambulatory Visit | Attending: Obstetrics & Gynecology | Admitting: Obstetrics & Gynecology

## 2016-02-06 DIAGNOSIS — R748 Abnormal levels of other serum enzymes: Secondary | ICD-10-CM | POA: Diagnosis not present

## 2016-02-06 DIAGNOSIS — Z3A37 37 weeks gestation of pregnancy: Secondary | ICD-10-CM | POA: Diagnosis not present

## 2016-02-06 DIAGNOSIS — Z3689 Encounter for other specified antenatal screening: Secondary | ICD-10-CM

## 2016-02-06 DIAGNOSIS — O219 Vomiting of pregnancy, unspecified: Secondary | ICD-10-CM

## 2016-02-06 DIAGNOSIS — R1013 Epigastric pain: Secondary | ICD-10-CM | POA: Diagnosis not present

## 2016-02-06 DIAGNOSIS — K2100 Gastro-esophageal reflux disease with esophagitis, without bleeding: Secondary | ICD-10-CM

## 2016-02-06 DIAGNOSIS — O99613 Diseases of the digestive system complicating pregnancy, third trimester: Secondary | ICD-10-CM

## 2016-02-06 DIAGNOSIS — O26893 Other specified pregnancy related conditions, third trimester: Secondary | ICD-10-CM | POA: Diagnosis present

## 2016-02-06 DIAGNOSIS — R079 Chest pain, unspecified: Secondary | ICD-10-CM | POA: Diagnosis not present

## 2016-02-06 DIAGNOSIS — Z79899 Other long term (current) drug therapy: Secondary | ICD-10-CM | POA: Insufficient documentation

## 2016-02-06 LAB — COMPREHENSIVE METABOLIC PANEL
ALBUMIN: 2.9 g/dL — AB (ref 3.5–5.0)
ALK PHOS: 248 U/L — AB (ref 38–126)
ALT: 291 U/L — ABNORMAL HIGH (ref 14–54)
AST: 217 U/L — AB (ref 15–41)
Anion gap: 14 (ref 5–15)
BILIRUBIN TOTAL: 3.7 mg/dL — AB (ref 0.3–1.2)
BUN: 6 mg/dL (ref 6–20)
CALCIUM: 8.8 mg/dL — AB (ref 8.9–10.3)
CO2: 21 mmol/L — ABNORMAL LOW (ref 22–32)
Chloride: 101 mmol/L (ref 101–111)
Creatinine, Ser: 0.5 mg/dL (ref 0.44–1.00)
GFR calc Af Amer: >60 mL/min (ref >60)
GFR calc non Af Amer: >60 mL/min (ref >60)
Glucose, Bld: 81 mg/dL (ref 65–99)
POTASSIUM: 3.3 mmol/L — AB (ref 3.5–5.1)
Sodium: 136 mmol/L (ref 135–145)
TOTAL PROTEIN: 7 g/dL (ref 6.5–8.1)

## 2016-02-06 LAB — PROTEIN / CREATININE RATIO, URINE
Creatinine, Urine: 531 mg/dL
PROTEIN CREATININE RATIO: 0.12 mg/mg{creat} (ref 0.00–0.15)
Total Protein, Urine: 66 mg/dL

## 2016-02-06 LAB — CBC
HEMATOCRIT: 31 % — AB (ref 36.0–46.0)
HEMOGLOBIN: 10 g/dL — AB (ref 12.0–15.0)
MCH: 29.6 pg (ref 26.0–34.0)
MCHC: 32.3 g/dL (ref 30.0–36.0)
MCV: 91.7 fL (ref 78.0–100.0)
Platelets: 204 10*3/uL (ref 150–400)
RBC: 3.38 MIL/uL — ABNORMAL LOW (ref 3.87–5.11)
RDW: 14.7 % (ref 11.5–15.5)
WBC: 5.3 10*3/uL (ref 4.0–10.5)

## 2016-02-06 LAB — TROPONIN I: Troponin I: <0.03 ng/mL (ref <0.03)

## 2016-02-06 LAB — LIPASE, BLOOD: Lipase: 14 U/L (ref 11–51)

## 2016-02-06 MED ORDER — NITROGLYCERIN 0.4 MG SL SUBL
0.4000 mg | SUBLINGUAL_TABLET | SUBLINGUAL | Status: DC | PRN
  Administered 2016-02-06: 0.4 mg via SUBLINGUAL
  Filled 2016-02-06: qty 1

## 2016-02-06 MED ORDER — GI COCKTAIL ~~LOC~~
30.0000 mL | Freq: Once | ORAL | Status: AC
  Administered 2016-02-06: 30 mL via ORAL
  Filled 2016-02-06: qty 30

## 2016-02-06 MED ORDER — METOCLOPRAMIDE HCL 10 MG PO TABS: 10.0000 mg | ORAL_TABLET | Freq: Four times a day (QID) | ORAL | 0 refills | Status: DC | PRN

## 2016-02-06 MED ORDER — PANTOPRAZOLE SODIUM 40 MG PO TBEC: 40.0000 mg | DELAYED_RELEASE_TABLET | Freq: Every day | ORAL | 1 refills | Status: DC

## 2016-02-06 MED ORDER — PANTOPRAZOLE SODIUM 40 MG PO TBEC
40.0000 mg | DELAYED_RELEASE_TABLET | Freq: Every day | ORAL | Status: DC
  Administered 2016-02-06: 40 mg via ORAL
  Filled 2016-02-06 (×2): qty 1

## 2016-02-06 NOTE — Discharge Instructions (Signed)
Hyperemesis Gravidarum Hyperemesis gravidarum is a severe form of nausea and vomiting that happens during pregnancy. Hyperemesis is worse than morning sickness. It may cause you to have nausea or vomiting all day for many days. It may keep you from eating and drinking enough food and liquids. Hyperemesis usually occurs during the first half (the first 20 weeks) of pregnancy. It often goes away once a woman is in her second half of pregnancy. However, sometimes hyperemesis continues through an entire pregnancy. What are the causes? The cause of this condition is not known. It may be related to changes in chemicals (hormones) in the body during pregnancy, such as the high level of pregnancy hormone (human chorionic gonadotropin) or the increase in the female sex hormone (estrogen). What are the signs or symptoms? Symptoms of this condition include:  Severe nausea and vomiting.  Nausea that does not go away.  Vomiting that does not allow you to keep any food down.  Weight loss.  Body fluid loss (dehydration).  Having no desire to eat, or not liking food that you have previously enjoyed. How is this diagnosed? This condition may be diagnosed based on:  A physical exam.  Your medical history.  Your symptoms.  Blood tests.  Urine tests. How is this treated? This condition may be managed with medicine. If medicines to do not help relieve nausea and vomiting, you may need to receive fluids through an IV tube at the hospital. Follow these instructions at home:  Take over-the-counter and prescription medicines only as told by your health care provider.  Avoid iron pills and multivitamins that contain iron for the first 3-4 months of pregnancy. If you take prescription iron pills, do not stop taking them unless your health care provider approves.  Take the following actions to help prevent nausea and vomiting:  In the morning, before getting out of bed, try eating a couple of dry  crackers or a piece of toast.  Avoid foods and smells that upset your stomach. Fatty and spicy foods may make nausea worse.  Eat 5-6 small meals a day.  Do not drink fluids while eating meals. Drink between meals.  Eat or suck on things that have ginger in them. Ginger can help relieve nausea.  Avoid food preparation. The smell of food can spoil your appetite or trigger nausea.  Follow instructions from your health care provider about eating or drinking restrictions.  For snacks, eat high-protein foods, such as cheese.  Keep all follow-up and pre-birth (prenatal) visits as told by your health care provider. This is important. Contact a health care provider if:  You have pain in your abdomen.  You have a severe headache.  You have vision problems.  You are losing weight. Get help right away if:  You cannot drink fluids without vomiting.  You vomit blood.  You have constant nausea and vomiting.  You are very weak.  You are very thirsty.  You feel dizzy.  You faint.  You have a fever or other symptoms that last for more than 2-3 days.  You have a fever and your symptoms suddenly get worse. Summary  Hyperemesis gravidarum is a severe form of nausea and vomiting that happens during pregnancy.  Making some changes to your eating habits may help relieve nausea and vomiting.  This condition may be managed with medicine.  If medicines to do not help relieve nausea and vomiting, you may need to receive fluids through an IV tube at the hospital. This information is  not intended to replace advice given to you by your health care provider. Make sure you discuss any questions you have with your health care provider. Document Released: 01/14/2005 Document Revised: 09/13/2015 Document Reviewed: 09/13/2015 Elsevier Interactive Patient Education  2017 Elsevier Inc. Heartburn Introduction Heartburn is a type of pain or discomfort that can happen in the throat or chest. It is  often described as a burning pain. It may also cause a bad taste in the mouth. Heartburn may feel worse when you lie down or bend over. It may be caused by stomach contents that move back up (reflux) into the tube that connects the mouth with the stomach (esophagus). Follow these instructions at home: Take these actions to lessen your discomfort and to help avoid problems. Diet  Follow a diet as told by your doctor. You may need to avoid foods and drinks such as:  Coffee and tea (with or without caffeine).  Drinks that contain alcohol.  Energy drinks and sports drinks.  Carbonated drinks or sodas.  Chocolate and cocoa.  Peppermint and mint flavorings.  Garlic and onions.  Horseradish.  Spicy and acidic foods, such as peppers, chili powder, curry powder, vinegar, hot sauces, and BBQ sauce.  Citrus fruit juices and citrus fruits, such as oranges, lemons, and limes.  Tomato-based foods, such as red sauce, chili, salsa, and pizza with red sauce.  Fried and fatty foods, such as donuts, french fries, potato chips, and high-fat dressings.  High-fat meats, such as hot dogs, rib eye steak, sausage, ham, and bacon.  High-fat dairy items, such as whole milk, butter, and cream cheese.  Eat small meals often. Avoid eating large meals.  Avoid drinking large amounts of liquid with your meals.  Avoid eating meals during the 2-3 hours before bedtime.  Avoid lying down right after you eat.  Do not exercise right after you eat. General instructions  Pay attention to any changes in your symptoms.  Take over-the-counter and prescription medicines only as told by your doctor. Do not take aspirin, ibuprofen, or other NSAIDs unless your doctor says it is okay.  Do not use any tobacco products, including cigarettes, chewing tobacco, and e-cigarettes. If you need help quitting, ask your doctor.  Wear loose clothes. Do not wear anything tight around your waist.  Raise (elevate) the head of  your bed about 6 inches (15 cm).  Try to lower your stress. If you need help doing this, ask your doctor.  If you are overweight, lose an amount of weight that is healthy for you. Ask your doctor about a safe weight loss goal.  Keep all follow-up visits as told by your doctor. This is important. Contact a doctor if:  You have new symptoms.  You lose weight and you do not know why it is happening.  You have trouble swallowing, or it hurts to swallow.  You have wheezing or a cough that keeps happening.  Your symptoms do not get better with treatment.  You have heartburn often for more than two weeks. Get help right away if:  You have pain in your arms, neck, jaw, teeth, or back.  You feel sweaty, dizzy, or light-headed.  You have chest pain or shortness of breath.  You throw up (vomit) and your throw up looks like blood or coffee grounds.  Your poop (stool) is bloody or black. This information is not intended to replace advice given to you by your health care provider. Make sure you discuss any questions you have with  your health care provider. Document Released: 09/26/2010 Document Revised: 06/22/2015 Document Reviewed: 05/11/2014  2017 Elsevier Food Choices for Gastroesophageal Reflux Disease, Adult When you have gastroesophageal reflux disease (GERD), the foods you eat and your eating habits are very important. Choosing the right foods can help ease your discomfort. What guidelines do I need to follow?  Choose fruits, vegetables, whole grains, and low-fat dairy products.  Choose low-fat meat, fish, and poultry.  Limit fats such as oils, salad dressings, butter, nuts, and avocado.  Keep a food diary. This helps you identify foods that cause symptoms.  Avoid foods that cause symptoms. These may be different for everyone.  Eat small meals often instead of 3 large meals a day.  Eat your meals slowly, in a place where you are relaxed.  Limit fried foods.  Cook foods  using methods other than frying.  Avoid drinking alcohol.  Avoid drinking large amounts of liquids with your meals.  Avoid bending over or lying down until 2-3 hours after eating. What foods are not recommended? These are some foods and drinks that may make your symptoms worse: Vegetables  Tomatoes. Tomato juice. Tomato and spaghetti sauce. Chili peppers. Onion and garlic. Horseradish. Fruits  Oranges, grapefruit, and lemon (fruit and juice). Meats  High-fat meats, fish, and poultry. This includes hot dogs, ribs, ham, sausage, salami, and bacon. Dairy  Whole milk and chocolate milk. Sour cream. Cream. Butter. Ice cream. Cream cheese. Drinks  Coffee and tea. Bubbly (carbonated) drinks or energy drinks. Condiments  Hot sauce. Barbecue sauce. Sweets/Desserts  Chocolate and cocoa. Donuts. Peppermint and spearmint. Fats and Oils  High-fat foods. This includes [place] fries and potato chips. Other  Vinegar. Strong spices. This includes black pepper, white pepper, red pepper, cayenne, curry powder, cloves, ginger, and chili powder. The items listed above may not be a complete list of foods and drinks to avoid. Contact your dietitian for more information.  This information is not intended to replace advice given to you by your health care provider. Make sure you discuss any questions you have with your health care provider. Document Released: 07/16/2011 Document Revised: 06/22/2015 Document Reviewed: 11/18/2012 Elsevier Interactive Patient Education  2017 [employer].

## 2016-02-06 NOTE — MAU Provider Note (Signed)
History     CSN: [id number]  Arrival date and time: 02/06/16 1021   None     Chief Complaint  Patient presents with  . Chest Pain   G2P1001 @37 .5 weeks here with acute onset epigastric and chest pain about 1 hour ago. She describes pain as sharp and pressure. She also endorses SOB since the pain started. She denies HA and visual disturbances. She endorses N/V with every meal she eats something since last night. She had HEG early in pregnancy which resolved but then recurred again 1 month ago and is vomiting daily. She has been prescribed antiemetics but is not using them. She reports no FM yet today. She denies VB, LOF, and ctx.    OB History    Gravida Para Term Preterm AB Living   2 1 1  0 0 1   SAB TAB Ectopic Multiple Live Births   0 0 0 0 1      Past Medical History:  Diagnosis Date  . Medical history non-contributory     Past Surgical History:  Procedure Laterality Date  . NO PAST SURGERIES      No family history on file.  Social History  Substance Use Topics  . Smoking status: Never Smoker  . Smokeless tobacco: Never Used  . Alcohol use No    Allergies: No Known Allergies  Prescriptions Prior to Admission  Medication Sig Dispense Refill Last Dose  . ondansetron (ZOFRAN) 8 MG tablet Take 1 tablet (8 mg total) by mouth every 8 (eight) hours as needed for nausea or vomiting. 20 tablet 2 Taking  . Prenatal Multivit-Min-Fe-FA (PRENATAL VITAMINS) 0.8 MG tablet Take 1 tablet by mouth daily. 30 tablet 12 Past Week at Unknown time  . promethazine (PHENERGAN) 12.5 MG tablet Take 1 tablet (12.5 mg total) by mouth every 6 (six) hours as needed for nausea or vomiting. 30 tablet 0 Taking  . ranitidine (ZANTAC) 150 MG tablet Take 1 tablet (150 mg total) by mouth 2 (two) times daily. 60 tablet 1 Past Week at Unknown time    Review of Systems  Constitutional: Negative.   Eyes: Negative for visual disturbance.  Respiratory: Positive for shortness of breath.    Cardiovascular: Positive for chest pain.  Gastrointestinal: Positive for abdominal pain (epigastric), nausea and vomiting.  Neurological: Negative for headaches.   Physical Exam   Last menstrual period 05/18/2015, currently breastfeeding.  Patient Vitals for the past 24 hrs:  BP Pulse Resp SpO2  02/06/16 1432 128/77 85 18 -  02/06/16 1232 110/76 77 - -  02/06/16 1218 - 74 - -  02/06/16 1217 118/76 79 - -  02/06/16 1202 (!) 119/50 84 - -  02/06/16 1157 - - - 99 %  02/06/16 1147 122/72 95 - -  02/06/16 1142 127/85 115 - -  02/06/16 1110 122/77 102 - -  02/06/16 1109 122/77 - - -  02/06/16 1032 141/89 108 16 100 %    Physical Exam  Constitutional: She is oriented to person, place, and time. She appears well-developed and well-nourished. She appears distressed (tachypneic, hold hand over sternum).  HENT:  Head: Normocephalic and atraumatic.  Neck: Normal range of motion.  Cardiovascular: Regular rhythm and normal heart sounds.   tachy  Respiratory: Effort normal and breath sounds normal.  GI: Soft. She exhibits no distension. There is no tenderness.  gravid  Musculoskeletal: Normal range of motion.  Neurological: She is alert and oriented to person, place, and time.  Skin: Skin is warm and  dry.  Psychiatric: She has a normal mood and affect.   EFM: 125 bpm, mod variability, + accels, no decels Toco: none  Results for orders placed or performed during the hospital encounter of 02/06/16 (from the past 24 hour(s))  CBC     Status: Abnormal   Collection Time: 02/06/16 10:37 AM  Result Value Ref Range   WBC 5.3 4.0 - 10.5 K/uL   RBC 3.38 (L) 3.87 - 5.11 MIL/uL   Hemoglobin 10.0 (L) 12.0 - 15.0 g/dL   HCT 29.531.0 (L) 62.136.0 - 30.846.0 %   MCV 91.7 78.0 - 100.0 fL   MCH 29.6 26.0 - 34.0 pg   MCHC 32.3 30.0 - 36.0 g/dL   RDW 65.714.7 84.611.5 - 96.215.5 %   Platelets 204 150 - 400 K/uL  Comprehensive metabolic panel     Status: Abnormal   Collection Time: 02/06/16 10:37 AM  Result Value Ref  Range   Sodium 136 135 - 145 mmol/L   Potassium 3.3 (L) 3.5 - 5.1 mmol/L   Chloride 101 101 - 111 mmol/L   CO2 21 (L) 22 - 32 mmol/L   Glucose, Bld 81 65 - 99 mg/dL   BUN 6 6 - 20 mg/dL   Creatinine, Ser 9.520.50 0.44 - 1.00 mg/dL   Calcium 8.8 (L) 8.9 - 10.3 mg/dL   Total Protein 7.0 6.5 - 8.1 g/dL   Albumin 2.9 (L) 3.5 - 5.0 g/dL   AST 841217 (H) 15 - 41 U/L   ALT 291 (H) 14 - 54 U/L   Alkaline Phosphatase 248 (H) 38 - 126 U/L   Total Bilirubin 3.7 (H) 0.3 - 1.2 mg/dL   GFR calc non Af Amer >60 >60 mL/min   GFR calc Af Amer >60 >60 mL/min   Anion gap 14 5 - 15  Troponin I (q 6hr x 3)     Status: None   Collection Time: 02/06/16 10:37 AM  Result Value Ref Range   Troponin I <0.03 <0.03 ng/mL  Lipase, blood     Status: None   Collection Time: 02/06/16 10:37 AM  Result Value Ref Range   Lipase 14 11 - 51 U/L  Protein / creatinine ratio, urine     Status: None   Collection Time: 02/06/16 11:35 AM  Result Value Ref Range   Creatinine, Urine 531.00 mg/dL   Total Protein, Urine 66 mg/dL   Protein Creatinine Ratio 0.12 0.00 - 0.15 mg/mg[Cre]   Dg Chest 2 View  Result Date: 02/06/2016 CLINICAL DATA:  Pregnant patient with mid chest pain. EXAM: CHEST  2 VIEW COMPARISON:  None. FINDINGS: Normal cardiac mediastinal contours. Low lung volumes. No consolidative pulmonary opacities. No pleural effusion or pneumothorax. Regional skeleton is unremarkable. IMPRESSION: No active cardiopulmonary disease. Electronically Signed   By: Annia Beltrew  Davis M.D.   On: 02/06/2016 11:11   [place] Abdomen Limited Ruq  Result Date: 02/06/2016 CLINICAL DATA:  The patient is [redacted] weeks pregnant and is and is experiencing chest and epigastric pain with elevated liver enzymes. EXAM: US ABDOMEN LIMITED - RIGHT UPPER QUADRANT COMPARISON:  None in PACs FINDINGS: Gallbladder: The gallbladder is adequately distended. There is a prominent fold in the fundus. There is no gallbladder wall thickening, pericholecystic fluid, or positive  sonographic Murphy's sign. Common bile duct: Diameter: 2.7 mm Liver: The hepatic echotexture is subjectively mildly increased. There is no focal mass or ductal dilation. The surface contour of the liver is normal. IMPRESSION: No gallstones or sonographic evidence of acute cholecystitis. Normal  appearance of the common bile duct. Mildly increased hepatic echotexture likely reflects fatty infiltrative change. Electronically Signed   By: David  [place] M.D.   On: 02/06/2016 13:20   MAU Course  Procedures  Nitro GI cocktail  MDM EKG and CXR nml therefore unlikely cardiology etiology. Elevated liver enzymes and Bili noted although RUQ [place] nml so unlikely cholelithiasis. BPs all normal except the initial reading on admit when patient in pain. Pre-e labs normal except LFTs although this is unlikely dx without any other sx or lab findings. HEG throughout most of pregnancy could explain elevated LE. Patient now resting quietly in bed. Presentation, clinical findings, labs and imaging discussed with Dr. Debroah Loop, recommends discharge home and return in [facility] to [facility] for [facility] labs. Will switch to Protonix for GERD. Stable for discharge home.   Assessment and Plan   1. [redacted] weeks gestation of pregnancy   2. Chest pain   3. Epigastric pain   4. Elevated liver enzymes   5. NST (non-stress test) reactive   6. Gastroesophageal reflux disease with esophagitis    Discharge home Follow up in [facility] in 2 days-message sent to admin pool Pre-e precautions Return for worsening sx  Allergies as of 02/06/2016   No Known Allergies     Medication List    STOP taking these medications   ondansetron 8 MG tablet Commonly known as:  ZOFRAN   promethazine 12.5 MG tablet Commonly known as:  PHENERGAN   ranitidine 150 MG tablet Commonly known as:  ZANTAC     TAKE these medications   metoCLOPramide 10 MG tablet Commonly known as:  REGLAN Take 1 tablet (10 mg total) by mouth every 6 (six) hours as needed for  nausea.   pantoprazole 40 MG tablet Commonly known as:  PROTONIX Take 1 tablet (40 mg total) by mouth daily.   Prenatal Vitamins 0.8 MG tablet Take 1 tablet by mouth daily.      Donette Larry, CNM 02/06/2016, 10:33 AM

## 2016-02-06 NOTE — MAU Note (Signed)
Pr presents to MAU with chest pain. Reports that pain started more than an hour ago. Describes pain as, "like my heart is coming out". Denies bleeding or leaking of fluid. Reports she hasn't felt abby move this morning. Denies any chills, fever,  Headache or visual disturbances today. Pt does have epigastric pain

## 2016-02-08 ENCOUNTER — Ambulatory Visit (INDEPENDENT_AMBULATORY_CARE_PROVIDER_SITE_OTHER): Payer: Self-pay | Admitting: Advanced Practice Midwife

## 2016-02-08 VITALS — BP 117/75 | HR 87 | Temp 98.5°F | Wt 218.4 lb

## 2016-02-08 DIAGNOSIS — Z789 Other specified health status: Secondary | ICD-10-CM

## 2016-02-08 DIAGNOSIS — O26813 Pregnancy related exhaustion and fatigue, third trimester: Secondary | ICD-10-CM

## 2016-02-08 DIAGNOSIS — R748 Abnormal levels of other serum enzymes: Secondary | ICD-10-CM

## 2016-02-08 DIAGNOSIS — O219 Vomiting of pregnancy, unspecified: Secondary | ICD-10-CM

## 2016-02-08 NOTE — Progress Notes (Signed)
   PRENATAL VISIT NOTE  Subjective:  Lucita Jaycox is a 26 y.o. G2P1001 at 4777w0d being seen today for ongoing prenatal care.  She is currently monitored for the following issues for this low-risk pregnancy and has Mastitis chronic; Breast pain, right; Language barrier to communication; Supervision of other high risk pregnancy, antepartum; Short interval between pregnancies affecting pregnancy in first trimester, antepartum; Poor weight gain of pregnancy; and Nausea and vomiting during pregnancy on her problem list.  Patient reports nausea with vomiting 5-6x daily not improved by medications started after 1/9 MAU visit.  She also feels fatigued and has general malaise. .  Contractions: Irregular. Vag. Bleeding: Other.  Movement: Present. Denies leaking of fluid.   The following portions of the patient's history were reviewed and updated as appropriate: allergies, current medications, past family history, past medical history, past social history, past surgical history and problem list. Problem list updated.  Objective:   Vitals:   02/08/16 1524 02/08/16 1530  BP: (!) 95/54 117/75  Pulse: 89 87  Temp: 98.5 F (36.9 C)   Weight: 218 lb 6.4 oz (99.1 kg)     Fetal Status: Fetal Heart Rate (bpm): 130   Movement: Present     General:  Alert, oriented and cooperative. Patient is in no acute distress.  Skin: Skin is warm and dry. No rash noted.   Cardiovascular: Normal heart rate noted  Respiratory: Normal respiratory effort, no problems with respiration noted  Abdomen: Soft, gravid, appropriate for gestational age. Pain/Pressure: Present     Pelvic:  Cervical exam deferred        Extremities: Normal range of motion.  Edema: None  Mental Status: Normal mood and affect. Normal behavior. Normal judgment and thought content.   Assessment and Plan:  Pregnancy: G2P1001 at 7777w0d  1. Nausea and vomiting during pregnancy --Hyperemesis with 46 lb weight loss in early pregnancy, regained some but  still 11 lb weight loss overall in pregnancy.  Nausea improved but has occurred intermittently throughout pregnancy. Returned last week and has been more severe than before, making it difficult to keep down any fluid.  2. Language barrier to communication --Pt declines language line and is able to communicate during visit in [place].  3. Elevated liver enzymes --? Fatty liver on 02/06/16.  Repeat labwork today per Dr Debroah LoopArnold.   --With likely dehydration from n/v with recent weight loss and need for labs, will send to MAU for IV fluids and labs today.  4. Fatigue during pregnancy in third trimester   Term labor symptoms and general obstetric precautions including but not limited to vaginal bleeding, contractions, leaking of fluid and fetal movement were reviewed in detail with the patient. Please refer to After Visit Summary for other counseling recommendations.  No Follow-up on file.   Hurshel PartyLisa A Leftwich-Kirby, CNM

## 2016-02-08 NOTE — Progress Notes (Signed)
Declines interpreter. C/o feeling tired. States was having a lot of nausea/ vomiting - and vaginal bleeding - came to MAU  02/05/16- no bleeding since then. States meds are helping the nausea and vomiting.States eating a little, drinking a lot of water.   Report called to MAU and patient taken to MAU for hydration/evaluation.

## 2016-02-12 ENCOUNTER — Encounter (HOSPITAL_COMMUNITY): Payer: Self-pay

## 2016-02-12 ENCOUNTER — Ambulatory Visit (INDEPENDENT_AMBULATORY_CARE_PROVIDER_SITE_OTHER): Payer: Self-pay | Admitting: Student

## 2016-02-12 ENCOUNTER — Inpatient Hospital Stay (HOSPITAL_COMMUNITY)
Admission: AD | Admit: 2016-02-12 | Discharge: 2016-02-14 | DRG: 775 | Disposition: A | Discharge status: Home / Self Care | Payer: Medicaid Other | Source: Ambulatory Visit | Attending: Family Medicine | Admitting: Family Medicine

## 2016-02-12 ENCOUNTER — Encounter (HOSPITAL_COMMUNITY)
Admission: AD | Disposition: A | Discharge status: Home / Self Care | Payer: Self-pay | Source: Ambulatory Visit | Attending: Family Medicine

## 2016-02-12 VITALS — BP 115/71 | HR 103 | Wt 224.1 lb

## 2016-02-12 DIAGNOSIS — O09891 Supervision of other high risk pregnancies, first trimester: Secondary | ICD-10-CM

## 2016-02-12 DIAGNOSIS — Z3A38 38 weeks gestation of pregnancy: Secondary | ICD-10-CM

## 2016-02-12 DIAGNOSIS — Z3493 Encounter for supervision of normal pregnancy, unspecified, third trimester: Secondary | ICD-10-CM | POA: Diagnosis present

## 2016-02-12 DIAGNOSIS — O26893 Other specified pregnancy related conditions, third trimester: Secondary | ICD-10-CM

## 2016-02-12 DIAGNOSIS — O4290 Premature rupture of membranes, unspecified as to length of time between rupture and onset of labor, unspecified weeks of gestation: Secondary | ICD-10-CM | POA: Diagnosis present

## 2016-02-12 DIAGNOSIS — O09899 Supervision of other high risk pregnancies, unspecified trimester: Secondary | ICD-10-CM

## 2016-02-12 DIAGNOSIS — O4212 Full-term premature rupture of membranes, onset of labor more than 24 hours following rupture: Secondary | ICD-10-CM | POA: Diagnosis present

## 2016-02-12 DIAGNOSIS — Z789 Other specified health status: Secondary | ICD-10-CM

## 2016-02-12 DIAGNOSIS — N898 Other specified noninflammatory disorders of vagina: Secondary | ICD-10-CM

## 2016-02-12 LAB — CBC
HCT: 29.7 % — ABNORMAL LOW (ref 36.0–46.0)
Hemoglobin: 9.6 g/dL — ABNORMAL LOW (ref 12.0–15.0)
MCH: 29.9 pg (ref 26.0–34.0)
MCHC: 32.3 g/dL (ref 30.0–36.0)
MCV: 92.5 fL (ref 78.0–100.0)
Platelets: 198 10*3/uL (ref 150–400)
RBC: 3.21 MIL/uL — AB (ref 3.87–5.11)
RDW: 16.4 % — ABNORMAL HIGH (ref 11.5–15.5)
WBC: 5.3 10*3/uL (ref 4.0–10.5)

## 2016-02-12 LAB — TYPE AND SCREEN
ABO/RH(D): B POS
Antibody Screen: NEGATIVE

## 2016-02-12 LAB — AMNISURE RUPTURE OF MEMBRANE (ROM) NOT AT ARMC: Amnisure ROM: POSITIVE

## 2016-02-12 SURGERY — Surgical Case
Anesthesia: Regional

## 2016-02-12 MED ORDER — SENNOSIDES-DOCUSATE SODIUM 8.6-50 MG PO TABS
2.0000 | ORAL_TABLET | ORAL | Status: DC
  Administered 2016-02-12 – 2016-02-13 (×2): 2 via ORAL
  Filled 2016-02-12 (×2): qty 2

## 2016-02-12 MED ORDER — DIBUCAINE 1 % RE OINT: 1.0000 "application " | TOPICAL_OINTMENT | RECTAL | Status: DC | PRN

## 2016-02-12 MED ORDER — DIPHENHYDRAMINE HCL 25 MG PO CAPS: 25.0000 mg | ORAL_CAPSULE | Freq: Four times a day (QID) | ORAL | Status: DC | PRN

## 2016-02-12 MED ORDER — TERBUTALINE SULFATE 1 MG/ML IJ SOLN
0.2500 mg | Freq: Once | INTRAMUSCULAR | Status: DC | PRN
  Filled 2016-02-12: qty 1

## 2016-02-12 MED ORDER — BENZOCAINE-MENTHOL 20-0.5 % EX AERO: 1.0000 "application " | INHALATION_SPRAY | CUTANEOUS | Status: DC | PRN

## 2016-02-12 MED ORDER — ONDANSETRON HCL 4 MG/2ML IJ SOLN: 4.0000 mg | Freq: Four times a day (QID) | INTRAMUSCULAR | Status: DC | PRN

## 2016-02-12 MED ORDER — TETANUS-DIPHTH-ACELL PERTUSSIS 5-2.5-18.5 LF-MCG/0.5 IM SUSP
0.5000 mL | Freq: Once | INTRAMUSCULAR | Status: DC
Start: 2016-02-13 — End: 2016-02-14

## 2016-02-12 MED ORDER — ONDANSETRON HCL 4 MG PO TABS: 4.0000 mg | ORAL_TABLET | ORAL | Status: DC | PRN

## 2016-02-12 MED ORDER — SIMETHICONE 80 MG PO CHEW: 80.0000 mg | CHEWABLE_TABLET | ORAL | Status: DC | PRN

## 2016-02-12 MED ORDER — WITCH HAZEL-GLYCERIN EX PADS: 1.0000 "application " | MEDICATED_PAD | CUTANEOUS | Status: DC | PRN

## 2016-02-12 MED ORDER — OXYTOCIN 40 UNITS IN LACTATED RINGERS INFUSION - SIMPLE MED
1.0000 m[IU]/min | INTRAVENOUS | Status: DC
  Administered 2016-02-12: 2 m[IU]/min via INTRAVENOUS

## 2016-02-12 MED ORDER — PRENATAL MULTIVITAMIN CH
1.0000 | ORAL_TABLET | Freq: Every day | ORAL | Status: DC
  Administered 2016-02-13: 1 via ORAL
  Filled 2016-02-12: qty 1

## 2016-02-12 MED ORDER — LACTATED RINGERS IV SOLN: 500.0000 mL | INTRAVENOUS | Status: DC | PRN

## 2016-02-12 MED ORDER — SOD CITRATE-CITRIC ACID 500-334 MG/5ML PO SOLN
30.0000 mL | ORAL | Status: DC | PRN
  Filled 2016-02-12: qty 15

## 2016-02-12 MED ORDER — FLEET ENEMA 7-19 GM/118ML RE ENEM: 1.0000 | ENEMA | RECTAL | Status: DC | PRN

## 2016-02-12 MED ORDER — ZOLPIDEM TARTRATE 5 MG PO TABS: 5.0000 mg | ORAL_TABLET | Freq: Every evening | ORAL | Status: DC | PRN

## 2016-02-12 MED ORDER — MISOPROSTOL 200 MCG PO TABS
ORAL_TABLET | ORAL | Status: AC
  Filled 2016-02-12: qty 5

## 2016-02-12 MED ORDER — OXYCODONE HCL 5 MG PO TABS
5.0000 mg | ORAL_TABLET | ORAL | Status: DC | PRN
  Administered 2016-02-14: 5 mg via ORAL
  Filled 2016-02-12: qty 1

## 2016-02-12 MED ORDER — OXYTOCIN BOLUS FROM INFUSION
500.0000 mL | Freq: Once | INTRAVENOUS | Status: AC
  Administered 2016-02-12: 500 mL/h via INTRAVENOUS

## 2016-02-12 MED ORDER — OXYCODONE-ACETAMINOPHEN 5-325 MG PO TABS: 1.0000 | ORAL_TABLET | ORAL | Status: DC | PRN

## 2016-02-12 MED ORDER — IBUPROFEN 600 MG PO TABS
600.0000 mg | ORAL_TABLET | Freq: Four times a day (QID) | ORAL | Status: DC
  Administered 2016-02-12 – 2016-02-14 (×7): 600 mg via ORAL
  Filled 2016-02-12 (×7): qty 1

## 2016-02-12 MED ORDER — COCONUT OIL OIL: 1.0000 "application " | TOPICAL_OIL | Status: DC | PRN

## 2016-02-12 MED ORDER — FENTANYL CITRATE (PF) 100 MCG/2ML IJ SOLN
100.0000 ug | INTRAMUSCULAR | Status: DC | PRN
  Filled 2016-02-12: qty 2

## 2016-02-12 MED ORDER — LACTATED RINGERS IV SOLN: INTRAVENOUS | Status: DC

## 2016-02-12 MED ORDER — ACETAMINOPHEN 325 MG PO TABS
650.0000 mg | ORAL_TABLET | ORAL | Status: DC | PRN
  Administered 2016-02-13: 650 mg via ORAL
  Filled 2016-02-12: qty 2

## 2016-02-12 MED ORDER — OXYTOCIN 40 UNITS IN LACTATED RINGERS INFUSION - SIMPLE MED
2.5000 [IU]/h | INTRAVENOUS | Status: DC
  Filled 2016-02-12: qty 1000

## 2016-02-12 MED ORDER — OXYCODONE-ACETAMINOPHEN 5-325 MG PO TABS: 2.0000 | ORAL_TABLET | ORAL | Status: DC | PRN

## 2016-02-12 MED ORDER — LIDOCAINE HCL (PF) 1 % IJ SOLN
30.0000 mL | INTRAMUSCULAR | Status: DC | PRN
  Filled 2016-02-12: qty 30

## 2016-02-12 MED ORDER — ONDANSETRON HCL 4 MG/2ML IJ SOLN: 4.0000 mg | INTRAMUSCULAR | Status: DC | PRN

## 2016-02-12 MED ORDER — ACETAMINOPHEN 325 MG PO TABS: 650.0000 mg | ORAL_TABLET | ORAL | Status: DC | PRN

## 2016-02-12 NOTE — MAU Note (Signed)
Pt sent from clinic for amnisure. Pt unsure of when she rupture, possibly a week ago.

## 2016-02-12 NOTE — Progress Notes (Signed)
   PRENATAL VISIT NOTE  Subjective:  Morgan Mathews is a 26 y.o. G2P1001 at 6982w4d being seen today for ongoing prenatal care.  She is currently monitored for the following issues for this low-risk pregnancy and has Mastitis chronic; Breast pain, right; Language barrier to communication; Supervision of other high risk pregnancy, antepartum; Short interval between pregnancies affecting pregnancy in first trimester, antepartum; Poor weight gain of pregnancy; and Nausea and vomiting during pregnancy on her problem list.  Patient reports leaking clear watery fluid x 1 week.  Contractions: Irregular. Vag. Bleeding: None.  Movement: Present. Denies leaking of fluid.   The following portions of the patient's history were reviewed and updated as appropriate: allergies, current medications, past family history, past medical history, past social history, past surgical history and problem list. Problem list updated.  Objective:   Vitals:   02/12/16 1013  BP: 115/71  Pulse: (!) 103  Weight: 224 lb 1.6 oz (101.7 kg)    Fetal Status: Fetal Heart Rate (bpm): 132   Movement: Present    Fundal height 43 cm  General:  Alert, oriented and cooperative. Patient is in no acute distress.  Skin: Skin is warm and dry. No rash noted.   Cardiovascular: Normal heart rate noted  Respiratory: Normal respiratory effort, no problems with respiration noted  Abdomen: Soft, gravid, appropriate for gestational age. Pain/Pressure: Present     Pelvic:  Cervical exam deferred        Extremities: Normal range of motion.  Edema: None  Mental Status: Normal mood and affect. Normal behavior. Normal judgment and thought content.   Assessment and Plan:  Pregnancy: G2P1001 at 2582w4d  1. Supervision of other high risk pregnancy, antepartum   2. Language barrier to communication -pt declines [facility] interpreter -- form signed for interpreter services refusal  3. Short interval between pregnancies affecting pregnancy in first  trimester, antepartum   4. Vaginal discharge during pregnancy in third trimester -Fern negative, positive pooling --- will send to MAU for amnisure - Wet prep, genital  5. Large for dates -if pt not admitted for SROM -- will ordered outpatient u/a for growth  Term labor symptoms and general obstetric precautions including but not limited to vaginal bleeding, contractions, leaking of fluid and fetal movement were reviewed in detail with the patient. Please refer to After Visit Summary for other counseling recommendations.   To MAU for further evaluation  Return in about 1 week (around 02/19/2016) for Routine OB.   Judeth HornErin Mikeyla Music, NP

## 2016-02-12 NOTE — Progress Notes (Signed)
Dr Genevie AnnSchenk notified of patient's urge to push, rapid fetal descent, FHR tracing change, patient pain level, and SVE. Requesting to stay for delivery.

## 2016-02-12 NOTE — Progress Notes (Signed)
Dr Adrian BlackwaterStinson notified of patient rapid progression of labor, FHr tracing with decels, difficulty tracing FHR despite readjsuting, SVE, and interventions provided. NICU team and RROBRN requested for delivery.

## 2016-02-12 NOTE — H&P (Signed)
LABOR AND DELIVERY ADMISSION HISTORY AND PHYSICAL NOTE  Morgan Mathews is a 26 y.o. female G2P1001 with IUP at [redacted]w[redacted]d by definite LMP presenting after being seen in clinic with reports of vaginal leaking over the past week. She was evaluated earlier this week with bleeding on 02/06/16. Yesterday she reports feeling a gush of fluid. She was seen in clinic earlier today and found to have a positive amnisure.   She reports positive fetal movement. She denies leakage of fluid or vaginal bleeding.  Prenatal History/Complications:  Past Medical History: Past Medical History:  Diagnosis Date  . Medical history non-contributory     Past Surgical History: Past Surgical History:  Procedure Laterality Date  . NO PAST SURGERIES      Obstetrical History: OB History    Gravida Para Term Preterm AB Living   2 1 1  0 0 1   SAB TAB Ectopic Multiple Live Births   0 0 0 0 1      Social History: Social History   Social History  . Marital status: Married    Spouse name: N/A  . Number of children: N/A  . Years of education: N/A   Social History Main Topics  . Smoking status: Never Smoker  . Smokeless tobacco: Never Used  . Alcohol use No  . Drug use: No  . Sexual activity: Yes    Birth control/ protection: None   Other Topics Concern  . None   Social History Narrative  . None    Family History: History reviewed. No pertinent family history.  Allergies: No Known Allergies  Prescriptions Prior to Admission  Medication Sig Dispense Refill Last Dose  . metoCLOPramide (REGLAN) 10 MG tablet Take 1 tablet (10 mg total) by mouth every 6 (six) hours as needed for nausea. 30 tablet 0 02/12/2016 at Unknown time  . pantoprazole (PROTONIX) 40 MG tablet Take 1 tablet (40 mg total) by mouth daily. 30 tablet 1 02/12/2016 at Unknown time  . Prenatal Multivit-Min-Fe-FA (PRENATAL VITAMINS) 0.8 MG tablet Take 1 tablet by mouth daily. 30 tablet 12 02/11/2016 at Unknown time     Review of Systems    All systems reviewed and negative except as stated in HPI  Blood pressure 120/76, pulse 91, temperature 98.4 F (36.9 C), temperature source Oral, resp. rate 18, height 5\' 6"  (1.676 m), weight 225 lb (102.1 kg), last menstrual period 05/18/2015, currently breastfeeding. General appearance: alert, cooperative and no distress Lungs: clear to auscultation bilaterally Heart: regular rate and rhythm Abdomen: soft, non-tender; bowel sounds normal Extremities: No calf swelling or tenderness Presentation: cephalic Fetal monitoring: Continuous showing baseline in the 140s, + accels, - decels, moderate variability. Category I tracing Uterine activity: irregular  : Dilation: 4 Effacement (%): 80 Station: -3 Exam by:: Lujean Rave rN   Prenatal labs: ABO, Rh: B/POS/-- (07/24 1624) Antibody: NEG (07/24 1624) Rubella: immune RPR: NON REAC (11/09 1044)  HBsAg: NEGATIVE (07/24 1624)  HIV: NONREACTIVE (11/09 1044)  GBS: Negative (01/04 0000)  3 hr Glucola: within normal limits Genetic screening:  AFP negative Anatomy US: normal  Prenatal Transfer Tool  Maternal Diabetes: No Genetic Screening: Normal Maternal Ultrasounds/Referrals: Normal Fetal Ultrasounds or other Referrals:  None Maternal Substance Abuse:  No Significant Maternal Medications:  None Significant Maternal Lab Results: None  Results for orders placed or performed during the hospital encounter of 02/12/16 (from the past 24 hour(s))  Amnisure rupture of membrane (rom)not at [facility]   Collection Time: 02/12/16 11:16 AM  Result Value Ref  Range   Amnisure ROM POSITIVE   CBC   Collection Time: 02/12/16 12:15 PM  Result Value Ref Range   WBC 5.3 4.0 - 10.5 K/uL   RBC 3.21 (L) 3.87 - 5.11 MIL/uL   Hemoglobin 9.6 (L) 12.0 - 15.0 g/dL   HCT 09.829.7 (L) 11.936.0 - 14.746.0 %   MCV 92.5 78.0 - 100.0 fL   MCH 29.9 26.0 - 34.0 pg   MCHC 32.3 30.0 - 36.0 g/dL   RDW 82.916.4 (H) 56.211.5 - 13.015.5 %   Platelets 198 150 - 400 K/uL    Patient  Active Problem List   Diagnosis Date Noted  . Delayed delivery after SROM (spontaneous rupture of membranes) 02/12/2016  . Nausea and vomiting during pregnancy 12/28/2015  . Poor weight gain of pregnancy 10/24/2015  . Supervision of other high risk pregnancy, antepartum 08/21/2015  . Short interval between pregnancies affecting pregnancy in first trimester, antepartum 08/21/2015  . Language barrier to communication 05/11/2014  . Mastitis chronic 05/10/2014  . Breast pain, right 05/10/2014    Assessment: Morgan Mathews is a 26 y.o. G2P1001 at 265w4d here for admission secondary to having leakage of fluid for at least the past 24 hrs with positive amnisure.   #labor: IOL with pitocin #pain: Natural birth, no epidural #FWB: Category I- FHR 130s, + accels, - decels, mod variability #ID:  GBS negative #MOF: Breast/bottle #MOC:undecided #Circ:  yes  Lise AuerMegan C Campbell, MD PGY-2 02/12/2016, 1:45 PM   OB FELLOW HISTORY AND PHYSICAL ATTESTATION  I have seen and examined this patient; I agree with above documentation in the resident's note.    Ernestina Pennaicholas Jisella Ashenfelter 02/12/2016, 2:59 PM

## 2016-02-12 NOTE — Progress Notes (Signed)
Delivery of viable female by Dr Cyril LoosenSchnek, assisted by Dr Adrian BlackwaterStinson per vacuum x2. NICU and RROBRN at bedside for delivery.

## 2016-02-12 NOTE — Patient Instructions (Signed)

## 2016-02-12 NOTE — Progress Notes (Signed)
Declines interpreter. C/o water like discharge for about a week, more last week.

## 2016-02-12 NOTE — Progress Notes (Signed)
  Patient evaluated. She had just been checked by nursing and found to be 5/80/-2. She is starting to have more painful contractions. She is also feeling more pelvic pressure. She had passage of a large amount of fluid just prior to the increased intensity of contractions.   FHT: baseline in 130s, +accels, possibly some early decels, moderate variability  Continue with expectant management. Cytotec has been placed and membranes are ruptured.

## 2016-02-12 NOTE — Consult Note (Signed)
[facility]  Delivery Note:  SVD    02/12/2016  7:10 PM  I was called to the delivery room at the request of the patient's obstetrician (Dr. Adrian BlackwaterStinson) for non-reassuring fetal heart tones.  PRENATAL HX:  This is a 26 y/o G2P1001 at 7838 and 4/[redacted] weeks gestation who was admitted today for SROM at 2200 on 1/14 (ROM 21 hours).  She is GBS negative and has been afebrile with Tmax 37.2.  Labor was induced. There was a 3 minute fetal heart rate deceleration prior to delivery, so NICU team called.  Delivery was vacuum assisted.    DELIVERY:  Infant had poor tone at delivery so cord clamping was not delayed.  The infant was apneic with HR < 100 and did not respond to standard warming, drying and stimulation.  PPV administered with immediate improvement in HR, but still no spontaneous respiration, so PPV administered for 30 seconds x2.  He then began to breathe spontaneously.  O2 saturations in 70s per pulse oximeter, so blow by O2 administered from ~ 5 minutes to 10 minutes of age.  He then began grunting so CPAP applied until ~ 20 minutes of age.  APGARs 1, 7 and 8.  By 20 minutes of age, O2 saturations were 95% in RA and work of breathing had improved.  Infant then left with mother for skin-to-skin care.    _____________________ Electronically Signed By: Maryan CharLindsey Samiksha Pellicano, MD Neonatologist

## 2016-02-12 NOTE — Progress Notes (Signed)
Report called To Clarene CritchleyJudy , Charge RN in MAU and patient taken to MAU for evaluation.

## 2016-02-12 NOTE — Progress Notes (Signed)
Dr Orvan Falconercampbell notified of patient SVE, FHR tracing, rapid change in patient pain level and urge to push. Requesting for delivery.

## 2016-02-13 LAB — CBC
HEMATOCRIT: 27.8 % — AB (ref 36.0–46.0)
HEMOGLOBIN: 9.1 g/dL — AB (ref 12.0–15.0)
MCH: 30 pg (ref 26.0–34.0)
MCHC: 32.7 g/dL (ref 30.0–36.0)
MCV: 91.7 fL (ref 78.0–100.0)
Platelets: 173 10*3/uL (ref 150–400)
RBC: 3.03 MIL/uL — ABNORMAL LOW (ref 3.87–5.11)
RDW: 16.7 % — AB (ref 11.5–15.5)
WBC: 8.2 10*3/uL (ref 4.0–10.5)

## 2016-02-13 LAB — WET PREP, GENITAL
Clue Cells Wet Prep HPF POC: NONE SEEN
Trich, Wet Prep: NONE SEEN
Yeast Wet Prep HPF POC: NONE SEEN

## 2016-02-13 LAB — RPR: RPR: NONREACTIVE

## 2016-02-13 MED ORDER — IBUPROFEN 600 MG PO TABS: 600.0000 mg | ORAL_TABLET | Freq: Four times a day (QID) | ORAL | 0 refills | Status: DC

## 2016-02-13 NOTE — Discharge Summary (Signed)
Obstetric Discharge Summary Reason for Admission: onset of labor Prenatal Procedures: ultrasound Intrapartum Procedures: vacuum Postpartum Procedures: none Complications-Operative and Postpartum: none Hemoglobin  Date Value Ref Range Status  02/12/2016 9.6 (L) 12.0 - 15.0 g/dL Final   HCT  Date Value Ref Range Status  02/12/2016 29.7 (L) 36.0 - 46.0 % Final    Physical Exam:  General: alert, cooperative and no distress Lochia: appropriate Uterine Fundus: firm Incision: n/a DVT Evaluation: No evidence of DVT seen on physical exam.  Discharge Diagnoses: Term Pregnancy-delivered  Discharge Information: Date: 02/13/2016 Activity: unrestricted and pelvic rest Diet: routine Medications: PNV and Ibuprofen Condition: stable Instructions: refer to practice specific booklet Discharge to: home  Contraception: undecided, considering LARC Follow-up Information    [facility] Follow up in 5 week(s).   Contact information: [address] [place] [id number] [phone]          Newborn Data: Live born female  Birth Weight: 8 lb 3.4 oz (3725 g) APGAR: 2, 7  Home with mother.  Misty StanleyLisa Leftwich-Kirby 02/13/2016, 9:22 AM

## 2016-02-13 NOTE — Lactation Note (Signed)
This note was copied from a baby's chart. Lactation Consultation Note  Patient Name: Boy Alysen Maxcy Today's Date: 02/13/2016 Reason for consult: Initial assessment Baby at 22 hr of life. Mom is offering breast and formula because she does not think she has any milk. Demonstrated manual expression, colostrum noted bilaterally, spoon in room. After seeing colostrum Mom stated that her milk "just came in". Baby was laying in basinet with sucking a pacifier. Explained the risks of artificial nipples and offered to help mom with latch. She declined help because "the baby just ate". She reports bf her older child for 5678m and supplementing "some". Discussed baby behavior, feeding frequency, baby belly size, voids, wt loss, breast changes, and nipple care. Given lactation handouts. Aware of OP services and support group. Offered pump for use between feedings and mom declined.  Mom will bf on demand 8+/24hr from each breast for at least 10 minutes each side. If baby still acts hungry she will f/u will formula per volume guidelines.     Maternal Data Has patient been taught Hand Expression?: Yes Does the patient have breastfeeding experience prior to this delivery?: Yes  Feeding Feeding Type: Formula Nipple Type: Slow - flow  LATCH Score/Interventions                      Lactation Tools Discussed/Used WIC Program: Yes   Consult Status Consult Status: Follow-up Date: 02/14/16 Follow-up type: In-patient    Rulon Eisenmengerlizabeth E Koya Hunger 02/13/2016, 5:17 PM

## 2016-02-13 NOTE — Discharge Instructions (Signed)

## 2016-02-13 NOTE — Progress Notes (Signed)
UR chart review completed.  

## 2016-02-13 NOTE — Progress Notes (Signed)
Pt had requested information on Advance Directives upon admission.  I provided these to her nurse, as it was during the quiet hours of 2:00-4:00 pm.  I provided my card as well if pt has questions.    Chaplain Dyanne CarrelKaty Khaniya Tenaglia, Bcc Pager, [phone] 3:42 PM    02/13/16 1500  Clinical Encounter Type  Visited With Patient;Health care provider

## 2016-02-14 NOTE — Discharge Summary (Signed)
OB Discharge Summary     Patient Name: Morgan Mathews DOB: 06/01/1990 MRN: [id number]  Date of admission: 02/12/2016 Delivering MD: Lorne Skeens   Date of discharge: 02/14/2016  Admitting diagnosis: 38WKS, SROM Intrauterine pregnancy: [redacted]w[redacted]d     Secondary diagnosis:  Active Problems:   Delayed delivery after SROM (spontaneous rupture of membranes)   Vacuum extractor delivery, delivered   Shoulder dystocia during labor and delivery, delivered  Additional problems: VAVD, tight nuchal cord x2, unable to reduce, clamped and cut on perineum, shoulder dystocia for 45 seconds     Discharge diagnosis: Term Pregnancy Delivered                                                                                                Post partum procedures:none  Augmentation: Pitocin  Hospital course:  Onset of Labor With Vaginal Delivery     26 y.o. yo [id number] at [redacted]w[redacted]d was admitted in Latent Labor on 02/12/2016. Patient had an labor course as follows:  Membrane Rupture Time/Date: 10:00 PM ,02/11/2016   Intrapartum Procedures: Episiotomy: None [1]                                         Lacerations:  Periurethral [8]   ROM >24hrs and required VAVD and baby had tight nuchal cord x2, unable to reduce, clamped and cut on perineum, shoulder dystocia for 45 seconds. Patient had a delivery of a Viable infant. 02/12/2016  Information for the patient's newborn:  Mathews, Boy Morgan [[id number]]  Delivery Method: Vaginal, Vacuum (Extractor) (Filed from Delivery Summary)    Pateint had an uncomplicated postpartum course.  She is ambulating, tolerating a regular diet, passing flatus, and urinating well. Patient is discharged home in stable condition on 02/14/16.    Physical exam Vitals:   02/13/16 0127 02/13/16 0815 02/13/16 1756 02/14/16 0557  BP: 123/73 115/74 109/72 116/70  Pulse: 80 83 89 87  Resp: 16 20 18 18   Temp: 97.5 F (36.4 C) 98.2 F (36.8 C) 97.9 F (36.6 C) 97.7 F (36.5 C)   TempSrc:  Oral Oral   SpO2: 98% 100%    Weight:      Height:       General: alert and cooperative Lochia: appropriate Uterine Fundus: firm Incision: N/A DVT Evaluation: No evidence of DVT seen on physical exam. Labs: Lab Results  Component Value Date   WBC 8.2 02/13/2016   HGB 9.1 (L) 02/13/2016   HCT 27.8 (L) 02/13/2016   MCV 91.7 02/13/2016   PLT 173 02/13/2016   CMP Latest Ref Rng & Units 02/06/2016  Glucose 65 - 99 mg/dL 81  BUN 6 - 20 mg/dL 6  Creatinine 4.13 - 2.44 mg/dL 0.10  Sodium 272 - 536 mmol/L 136  Potassium 3.5 - 5.1 mmol/L 3.3(L)  Chloride 101 - 111 mmol/L 101  CO2 22 - 32 mmol/L 21(L)  Calcium 8.9 - 10.3 mg/dL 6.4(Q)  Total Protein 6.5 - 8.1 g/dL 7.0  Total Bilirubin 0.3 - 1.2 mg/dL  3.7(H)  Alkaline Phos 38 - 126 U/L 248(H)  AST 15 - 41 U/L 217(H)  ALT 14 - 54 U/L 291(H)    Discharge instruction: per After Visit Summary and "Baby and Me Booklet".  After visit meds:  Allergies as of 02/14/2016   No Known Allergies     Medication List    TAKE these medications   ibuprofen 600 MG tablet Commonly known as:  ADVIL,MOTRIN Take 1 tablet (600 mg total) by mouth every 6 (six) hours.   metoCLOPramide 10 MG tablet Commonly known as:  REGLAN Take 1 tablet (10 mg total) by mouth every 6 (six) hours as needed for nausea.   pantoprazole 40 MG tablet Commonly known as:  PROTONIX Take 1 tablet (40 mg total) by mouth daily.   Prenatal Vitamins 0.8 MG tablet Take 1 tablet by mouth daily.       Diet: routine diet  Activity: Advance as tolerated. Pelvic rest for 6 weeks.   Outpatient follow up:6 weeks Follow up Appt:Future Appointments Date Time Provider Department Center  03/26/2016 1:20 PM Judeth HornErin Lawrence, NP [facility]   Follow up Visit:No Follow-up on file.  Postpartum contraception: Undecided  Newborn Data: Live born female  Birth Weight: 8 lb 3.4 oz (3725 g) APGAR: 2, 7  Baby Feeding: Bottle and Breast Disposition:home with  mother   02/14/2016 Renne Muscaaniel L Warden, MD  OB FELLOW DISCHARGE ATTESTATION  I have seen and examined this patient and agree with above documentation in the resident's note.   Ernestina PennaNicholas Schenk, MD 10:56 AM

## 2016-02-14 NOTE — Lactation Note (Signed)
This note was copied from a baby's chart. Lactation Consultation Note  Mother states she cramps when she breastfeeds. Provided education and suggest discussing pain management with her RN. Offered to help latch baby but mother stated she has no milk and she wants to eat her lunch. Baby recently received 25 ml of formula. Discussed supply and demand. Mom encouraged to feed baby 8-12 times/24 hours and with feeding cues.  Reviewed engorgement care and monitoring voids/stools. Provided mother with manual hand pump.   Patient Name: Morgan Mathews Today's Date: 02/14/2016     Maternal Data    Feeding    LATCH Score/Interventions                      Lactation Tools Discussed/Used     Consult Status      Dahlia ByesBerkelhammer, Rocio Roam Boschen 02/14/2016, 11:11 AM

## 2016-02-27 ENCOUNTER — Telehealth: Payer: Self-pay | Admitting: *Deleted

## 2016-02-27 NOTE — Telephone Encounter (Addendum)
Pt left message stating that she gave birth on 1/15. She wants to start working and requests a letter stating that she can work. Please call back.   1535  I called pt and was unable to speak with her or leave a message. Per chart review, pt had complicated vaginal birth on 1/15 including vacuum assist, shoulder dystocia and periurethral laceration. She will need at least 4 weeks for her body to heal properly before returning to work. She will need to be seen for post partum exam prior to returning to work. Her appt is 2/27 @ 1320.  2/5  1620  Called pt and left message stating that I am returning her call from last week. I asked her to leave a message stating whether a detailed response can be left on her voice mail.

## 2016-03-05 NOTE — Telephone Encounter (Signed)
Called patient back stating I am returning her call. Patient states she is starting to look for a job and doesn't currently have one but will wait to get the work letter until her appointment. Patient had no questions

## 2016-03-26 ENCOUNTER — Encounter: Payer: Self-pay | Admitting: Student

## 2016-03-26 ENCOUNTER — Ambulatory Visit (INDEPENDENT_AMBULATORY_CARE_PROVIDER_SITE_OTHER): Payer: Medicaid Other | Admitting: Student

## 2016-03-26 DIAGNOSIS — Z3202 Encounter for pregnancy test, result negative: Secondary | ICD-10-CM

## 2016-03-26 DIAGNOSIS — Z30011 Encounter for initial prescription of contraceptive pills: Secondary | ICD-10-CM

## 2016-03-26 DIAGNOSIS — N644 Mastodynia: Secondary | ICD-10-CM

## 2016-03-26 DIAGNOSIS — R2232 Localized swelling, mass and lump, left upper limb: Secondary | ICD-10-CM

## 2016-03-26 LAB — POCT PREGNANCY, URINE: Preg Test, Ur: NEGATIVE

## 2016-03-26 MED ORDER — NORETHINDRONE 0.35 MG PO TABS: 1.0000 | ORAL_TABLET | Freq: Every day | ORAL | 11 refills | Status: DC

## 2016-03-26 NOTE — Patient Instructions (Signed)
Health Maintenance, Female Adopting a healthy lifestyle and getting preventive care can go a long way to promote health and wellness. Talk with your health care provider about what schedule of regular examinations is right for you. This is a good chance for you to check in with your provider about disease prevention and staying healthy. In between checkups, there are plenty of things you can do on your own. Experts have done a lot of research about which lifestyle changes and preventive measures are most likely to keep you healthy. Ask your health care provider for more information. Weight and diet Eat a healthy diet  Be sure to include plenty of vegetables, fruits, low-fat dairy products, and lean protein.  Do not eat a lot of foods high in solid fats, added sugars, or salt.  Get regular exercise. This is one of the most important things you can do for your health.  Most adults should exercise for at least 150 minutes each week. The exercise should increase your heart rate and make you sweat (moderate-intensity exercise).  Most adults should also do strengthening exercises at least twice a week. This is in addition to the moderate-intensity exercise. Maintain a healthy weight  Body mass index (BMI) is a measurement that can be used to identify possible weight problems. It estimates body fat based on height and weight. Your health care provider can help determine your BMI and help you achieve or maintain a healthy weight.  For females 76 years of age and older:  A BMI below 18.5 is considered underweight.  A BMI of 18.5 to 24.9 is normal.  A BMI of 25 to 29.9 is considered overweight.  A BMI of 30 and above is considered obese. Watch levels of cholesterol and blood lipids  You should start having your blood tested for lipids and cholesterol at 25 years of age, then have this test every 5 years.  You may need to have your cholesterol levels checked more often if:  Your lipid or  cholesterol levels are high.  You are older than 25 years of age.  You are at high risk for heart disease. Cancer screening Lung Cancer  Lung cancer screening is recommended for adults 64-42 years old who are at high risk for lung cancer because of a history of smoking.  A yearly low-dose CT scan of the lungs is recommended for people who:  Currently smoke.  Have quit within the past 15 years.  Have at least a 30-pack-year history of smoking. A pack year is smoking an average of one pack of cigarettes a day for 1 year.  Yearly screening should continue until it has been 15 years since you quit.  Yearly screening should stop if you develop a health problem that would prevent you from having lung cancer treatment. Breast Cancer  Practice breast self-awareness. This means understanding how your breasts normally appear and feel.  It also means doing regular breast self-exams. Let your health care provider know about any changes, no matter how small.  If you are in your 20s or 30s, you should have a clinical breast exam (CBE) by a health care provider every 1-3 years as part of a regular health exam.  If you are 34 or older, have a CBE every year. Also consider having a breast X-ray (mammogram) every year.  If you have a family history of breast cancer, talk to your health care provider about genetic screening.  If you are at high risk for breast cancer, talk  to your health care provider about having an MRI and a mammogram every year.  Breast cancer gene (BRCA) assessment is recommended for women who have family members with BRCA-related cancers. BRCA-related cancers include:  Breast.  Ovarian.  Tubal.  Peritoneal cancers.  Results of the assessment will determine the need for genetic counseling and BRCA1 and BRCA2 testing. Cervical Cancer  Your health care provider may recommend that you be screened regularly for cancer of the pelvic organs (ovaries, uterus, and vagina).  This screening involves a pelvic examination, including checking for microscopic changes to the surface of your cervix (Pap test). You may be encouraged to have this screening done every 3 years, beginning at age 24.  For women ages 66-65, health care providers may recommend pelvic exams and Pap testing every 3 years, or they may recommend the Pap and pelvic exam, combined with testing for human papilloma virus (HPV), every 5 years. Some types of HPV increase your risk of cervical cancer. Testing for HPV may also be done on women of any age with unclear Pap test results.  Other health care providers may not recommend any screening for nonpregnant women who are considered low risk for pelvic cancer and who do not have symptoms. Ask your health care provider if a screening pelvic exam is right for you.  If you have had past treatment for cervical cancer or a condition that could lead to cancer, you need Pap tests and screening for cancer for at least 20 years after your treatment. If Pap tests have been discontinued, your risk factors (such as having a new sexual partner) need to be reassessed to determine if screening should resume. Some women have medical problems that increase the chance of getting cervical cancer. In these cases, your health care provider may recommend more frequent screening and Pap tests. Colorectal Cancer  This type of cancer can be detected and often prevented.  Routine colorectal cancer screening usually begins at 25 years of age and continues through 25 years of age.  Your health care provider may recommend screening at an earlier age if you have risk factors for colon cancer.  Your health care provider may also recommend using home test kits to check for hidden blood in the stool.  A small camera at the end of a tube can be used to examine your colon directly (sigmoidoscopy or colonoscopy). This is done to check for the earliest forms of colorectal cancer.  Routine  screening usually begins at age 41.  Direct examination of the colon should be repeated every 5-10 years through 25 years of age. However, you may need to be screened more often if early forms of precancerous polyps or small growths are found. Skin Cancer  Check your skin from head to toe regularly.  Tell your health care provider about any new moles or changes in moles, especially if there is a change in a mole's shape or color.  Also tell your health care provider if you have a mole that is larger than the size of a pencil eraser.  Always use sunscreen. Apply sunscreen liberally and repeatedly throughout the day.  Protect yourself by wearing long sleeves, pants, a wide-brimmed hat, and sunglasses whenever you are outside. Heart disease, diabetes, and high blood pressure  High blood pressure causes heart disease and increases the risk of stroke. High blood pressure is more likely to develop in:  People who have blood pressure in the high end of the normal range (130-139/85-89 mm Hg).  People who are overweight or obese.  People who are African American.  If you are 59-24 years of age, have your blood pressure checked every 3-5 years. If you are 34 years of age or older, have your blood pressure checked every year. You should have your blood pressure measured twice-once when you are at a hospital or clinic, and once when you are not at a hospital or clinic. Record the average of the two measurements. To check your blood pressure when you are not at a hospital or clinic, you can use:  An automated blood pressure machine at a pharmacy.  A home blood pressure monitor.  If you are between 29 years and 60 years old, ask your health care provider if you should take aspirin to prevent strokes.  Have regular diabetes screenings. This involves taking a blood sample to check your fasting blood sugar level.  If you are at a normal weight and have a low risk for diabetes, have this test once  every three years after 25 years of age.  If you are overweight and have a high risk for diabetes, consider being tested at a younger age or more often. Preventing infection Hepatitis B  If you have a higher risk for hepatitis B, you should be screened for this virus. You are considered at high risk for hepatitis B if:  You were born in a country where hepatitis B is common. Ask your health care provider which countries are considered high risk.  Your parents were born in a high-risk country, and you have not been immunized against hepatitis B (hepatitis B vaccine).  You have HIV or AIDS.  You use needles to inject street drugs.  You live with someone who has hepatitis B.  You have had sex with someone who has hepatitis B.  You get hemodialysis treatment.  You take certain medicines for conditions, including cancer, organ transplantation, and autoimmune conditions. Hepatitis C  Blood testing is recommended for:  Everyone born from [birth year] through 1964.  Anyone with known risk factors for hepatitis C. Sexually transmitted infections (STIs)  You should be screened for sexually transmitted infections (STIs) including gonorrhea and chlamydia if:  You are sexually active and are younger than 25 years of age.  You are older than 25 years of age and your health care provider tells you that you are at risk for this type of infection.  Your sexual activity has changed since you were last screened and you are at an increased risk for chlamydia or gonorrhea. Ask your health care provider if you are at risk.  If you do not have HIV, but are at risk, it may be recommended that you take a prescription medicine daily to prevent HIV infection. This is called pre-exposure prophylaxis (PrEP). You are considered at risk if:  You are sexually active and do not regularly use condoms or know the HIV status of your partner(s).  You take drugs by injection.  You are sexually active with a partner  who has HIV. Talk with your health care provider about whether you are at high risk of being infected with HIV. If you choose to begin PrEP, you should first be tested for HIV. You should then be tested every 3 months for as long as you are taking PrEP. Pregnancy  If you are premenopausal and you may become pregnant, ask your health care provider about preconception counseling.  If you may become pregnant, take 400 to 800 micrograms (mcg) of folic acid  every day.  If you want to prevent pregnancy, talk to your health care provider about birth control (contraception). Osteoporosis and menopause  Osteoporosis is a disease in which the bones lose minerals and strength with aging. This can result in serious bone fractures. Your risk for osteoporosis can be identified using a bone density scan.  If you are 76 years of age or older, or if you are at risk for osteoporosis and fractures, ask your health care provider if you should be screened.  Ask your health care provider whether you should take a calcium or vitamin D supplement to lower your risk for osteoporosis.  Menopause may have certain physical symptoms and risks.  Hormone replacement therapy may reduce some of these symptoms and risks. Talk to your health care provider about whether hormone replacement therapy is right for you. Follow these instructions at home:  Schedule regular health, dental, and eye exams.  Stay current with your immunizations.  Do not use any tobacco products including cigarettes, chewing tobacco, or electronic cigarettes.  If you are pregnant, do not drink alcohol.  If you are breastfeeding, limit how much and how often you drink alcohol.  Limit alcohol intake to no more than 1 drink per day for nonpregnant women. One drink equals 12 ounces of beer, 5 ounces of wine, or 1 ounces of hard liquor.  Do not use street drugs.  Do not share needles.  Ask your health care provider for help if you need support  or information about quitting drugs.  Tell your health care provider if you often feel depressed.  Tell your health care provider if you have ever been abused or do not feel safe at home. This information is not intended to replace advice given to you by your health care provider. Make sure you discuss any questions you have with your health care provider. Document Released: 07/30/2010 Document Revised: 06/22/2015 Document Reviewed: 10/18/2014 Elsevier Interactive Patient Education  2017 [employer]. Contraception Choices Contraception, also called birth control, means things to use or ways to try not to get pregnant. Hormonal birth control  This kind of birth control uses hormones. Here are some types of hormonal birth control:  A tube that is put under skin of the arm (implant). The tube can stay in for as long as 3 years.  Shots to get every 3 months (injections).  Pills to take every day (birth control pills).  A patch to change 1 time each week for 3 weeks (birth control patch). After that, the patch is taken off for 1 week.  A ring to put in the vagina. The ring is left in for 3 weeks. Then it is taken out of the vagina for 1 week. Then a new ring is put in.  Pills to take after unprotected sex (emergency birth control pills). Barrier birth control  Here are some types of barrier birth control:  A thin covering that is put on the penis before sex (female condom). The covering is thrown away after sex.  A soft, loose covering that is put in the vagina before sex (female condom). The covering is thrown away after sex.  A rubber bowl that sits over the cervix (diaphragm). The bowl must be made for you. The bowl is put into the vagina before sex. The bowl is left in for 6-8 hours after sex. It is taken out within 24 hours.  A small, soft cup that fits over the cervix (cervical cap). The cup must be made  for you. The cup can be left in for 6-8 hours after sex. It is taken out within  48 hours.  A sponge that is put into the vagina before sex. It must be left in for at least 6 hours after sex. It must be taken out within 30 hours. Then it is thrown away.  A chemical that kills or stops sperm from getting into the uterus (spermicide). It may be a pill, cream, jelly, or foam to put in the vagina. The chemical should be used at least 10-15 minutes before sex. IUD (intrauterine) birth control An IUD is a small, T-shaped piece of plastic. It is put inside the uterus. There are two kinds:  Hormone IUD. This kind can stay in for 3-5 years.  Copper IUD. This kind can stay in for 10 years. Permanent birth control Here are some types of permanent birth control:  Surgery to block the fallopian tubes.  Having an insert put into each fallopian tube.  Surgery to tie off the tubes that carry sperm (vasectomy). Natural planning birth control Here are some types of natural planning birth control:  Not having sex on the days the woman could get pregnant.  Using a calendar:  To keep track of the length of each period.  To find out what days pregnancy can happen.  To plan to not have sex on days when pregnancy can happen.  Watching for symptoms of ovulation and not having sex during ovulation. One way the woman can check for ovulation is to check her temperature.  Waiting to have sex until after ovulation. Summary  Contraception, also called birth control, means things to use or ways to try not to get pregnant.  Hormonal methods of birth control include implants, injections, pills, patches, vaginal rings, and emergency birth control pills.  Barrier methods of birth control can include female condoms, female condoms, diaphragms, cervical caps, sponges, and spermicides.  There are two types of IUD (intrauterine device) birth control. An IUD can be put in a woman's uterus to prevent pregnancy for 3-5 years.  Permanent sterilization can be done through a procedure for males,  females, or both.  Natural planning methods involve not having sex on the days when the woman could get pregnant. This information is not intended to replace advice given to you by your health care provider. Make sure you discuss any questions you have with your health care provider. Document Released: 11/11/2008 Document Revised: 01/25/2016 Document Reviewed: 01/25/2016 Elsevier Interactive Patient Education  2017 [employer].

## 2016-03-26 NOTE — Progress Notes (Signed)
Subjective:     Morgan Mathews is a 26 y.o. female who presents for a postpartum visit. She is 6 weeks postpartum following a spontaneous vaginal delivery. I have fully reviewed the prenatal and intrapartum course. The delivery was at 38.4 gestational weeks. Outcome: spontaneous vaginal delivery. Anesthesia: none. Postpartum course has been normal. Baby's course has been normal. Baby is feeding by both breast and bottle - Similac Advance. Bleeding staining only. Bowel function is normal. Bladder function is normal. Patient is not sexually active. Contraception method is undecided. Postpartum depression screening: negative.  Patient reports bilateral breast pain & painful lump in left axilla.   The following portions of the patient's history were reviewed and updated as appropriate: allergies, current medications, past family history, past medical history, past social history, past surgical history and problem list.  Review of Systems Pertinent items are noted in HPI.   Objective:    Wt 202 lb 6.4 oz (91.8 kg)   Breastfeeding? Yes   BMI 32.67 kg/m   General:  alert, cooperative, appears stated age, no distress and mildly obese  Lungs: clear to auscultation bilaterally  Heart:  regular rate and rhythm, S1, S2 normal, no murmur, click, rub or gallop  Breast  small (<1cm) mass palpated in left axilla; soft, round, and mobile. Non tender. No masses palpated in breasts. No nipple discharge.   Abdomen: soft, non-tender; bowel sounds normal; no masses,  no organomegaly        Assessment:     Normal postpartum exam. Pap smear not done at today's visit. Patient is due for pap smear 12/2016. Thinks she may have insurance by the end of the year; should call us for pap smear & can be scheduled with us or referred to pap clinic for self pay.  Pt initially interested in IUD, but is currently self pay. Discussed contraception options & pt interested in progestin only pills.   Plan:    1. Encounter  for routine postpartum follow-up   2. Encounter for initial prescription of contraceptive pills  - norethindrone (MICRONOR,CAMILA,ERRIN) 0.35 MG tablet; Take 1 tablet (0.35 mg total) by mouth daily.  Dispense: 1 Package; Refill: 11  3. Axillary mass, left  - Ambulatory Referral to [facility]  4. Breast pain in female  [facility]

## 2016-05-21 ENCOUNTER — Encounter: Payer: Self-pay | Admitting: Family Medicine

## 2016-05-21 ENCOUNTER — Other Ambulatory Visit: Payer: Self-pay | Admitting: Family Medicine

## 2016-05-21 MED ORDER — OFLOXACIN 0.3 % OP SOLN: 1.0000 [drp] | Freq: Four times a day (QID) | OPHTHALMIC | 0 refills | Status: AC

## 2016-05-21 NOTE — Progress Notes (Signed)
Mom came in with two son with B/L red eye, more on the right eye associated with yellowish discharge and crusting in the morning. Eye hurst at times she currently does not see her PCP due to insurance issue. She is reassured that her kids does not have that today. Good hand hygiene discussed to prevent spread to her kids. Ofloxacin prescribed to her. She is advised to f/u with her PCP soon or seek emergency health if symptoms worsens. She agreed with plan.

## 2016-08-27 ENCOUNTER — Encounter: Payer: Self-pay | Admitting: Internal Medicine

## 2016-08-27 ENCOUNTER — Ambulatory Visit (INDEPENDENT_AMBULATORY_CARE_PROVIDER_SITE_OTHER): Payer: Self-pay | Admitting: Internal Medicine

## 2016-08-27 VITALS — BP 126/70 | HR 60 | Resp 12 | Ht 64.0 in | Wt 220.0 lb

## 2016-08-27 DIAGNOSIS — M546 Pain in thoracic spine: Secondary | ICD-10-CM

## 2016-08-27 DIAGNOSIS — N6489 Other specified disorders of breast: Secondary | ICD-10-CM

## 2016-08-27 DIAGNOSIS — G8929 Other chronic pain: Secondary | ICD-10-CM

## 2016-08-27 DIAGNOSIS — R4589 Other symptoms and signs involving emotional state: Secondary | ICD-10-CM

## 2016-08-27 DIAGNOSIS — R0789 Other chest pain: Secondary | ICD-10-CM

## 2016-08-27 MED ORDER — IBUPROFEN 200 MG PO TABS: ORAL_TABLET | ORAL | 0 refills | Status: DC

## 2016-08-27 NOTE — Progress Notes (Signed)
   Subjective:    Patient ID: Morgan Mathews, female    DOB: 02-03-1990, 26 y.o.   MRN: [id number]  HPI   Here to establish.  1.  Right breast pain: Has a 26 month old infant.  Was fasting during Ramadan and could not make breast milk.  Sounds like stopped about a month or so ago.   Right breast pain since her first child since 02/2014. Later, admits this has been going on for years before this. Has been to the hospital and checked. Has been told there is nothing wrong.  Did undergo breast ultrasound in April 2016 and was normal. Intermittent pain is mainly on superior aspect of right breast.  No definite redness, or swelling of area.  She does feel the area is warm to touch.  No fever.   Has not taken any OTC medication for the pain.  During exam, also complains of pain in high back.  Also states at times when has pain in right breast, developed tenders lumps in right axilla.  Never come to a head or drain.  Current Meds  Medication Sig  . levonorgestrel (MIRENA, 52 MG,) 20 MCG/24HR IUD 1 each by Intrauterine route once.     Review of Systems     Objective:   Physical Exam  NAD Tearful when discuss need to continue education.  Patient with very fluent English in very short period of time in [place]; PERRL, EOMI Neck:  Supple, No adenopathy Chest:  CTA .  Very large pendulous breasts.  No focal mass, skin dimpling, nipple discharge, or axillary adenopathy or mass.  No erythema or induration. Tender at high right parasternal joints and into area where superior breast attaches to chest wall.  No mass here as well. Mild tenderness in musculature of upper thoracic back bilaterally  CV:  RRR without murmur or rub, radial and DP pulses normal and equal LE:  No edema         Assessment & Plan:  1.  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  To try Ibuprofen 200 mg 2-4 tabs every 6 hours with meals in meantime.  Follow up in 3 months   2.  Upper thoracic back pain:  Possibly related to breast heaviness.  Discussed wearing bras with adequate strap width and padding as well as  support.  However, funds are limited.  3.  Tearfulness with discussion regarding education:  Concern she may be dealing with some issues regarding non support she is currently not willing to share.  Will have [facility] contact her for possible need for social support and/or counseling if needed.  She did not share if has had problems in her country/family of origin or if in current situation.

## 2016-08-28 ENCOUNTER — Ambulatory Visit (INDEPENDENT_AMBULATORY_CARE_PROVIDER_SITE_OTHER): Payer: Self-pay | Admitting: Internal Medicine

## 2016-08-28 ENCOUNTER — Encounter: Payer: Self-pay | Admitting: Internal Medicine

## 2016-08-28 VITALS — BP 102/60 | HR 74 | Resp 12 | Ht 64.0 in | Wt 220.0 lb

## 2016-08-28 DIAGNOSIS — G8929 Other chronic pain: Secondary | ICD-10-CM

## 2016-08-28 DIAGNOSIS — M546 Pain in thoracic spine: Secondary | ICD-10-CM

## 2016-08-28 DIAGNOSIS — Z0000 Encounter for general adult medical examination without abnormal findings: Secondary | ICD-10-CM

## 2016-08-28 DIAGNOSIS — R0789 Other chest pain: Secondary | ICD-10-CM | POA: Insufficient documentation

## 2016-08-28 DIAGNOSIS — K219 Gastro-esophageal reflux disease without esophagitis: Secondary | ICD-10-CM

## 2016-08-28 HISTORY — DX: Other chronic pain: G89.29

## 2016-08-28 MED ORDER — FAMOTIDINE 20 MG PO TABS: ORAL_TABLET | ORAL | 3 refills | Status: DC
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
## 2016-08-28 NOTE — Patient Instructions (Addendum)
No coffee after morning No lying down after eat for 2 hours.   Elevate Head of bed.+

## 2016-08-29 ENCOUNTER — Other Ambulatory Visit (INDEPENDENT_AMBULATORY_CARE_PROVIDER_SITE_OTHER): Payer: Self-pay

## 2016-08-29 DIAGNOSIS — D649 Anemia, unspecified: Secondary | ICD-10-CM

## 2016-08-29 DIAGNOSIS — Z13220 Encounter for screening for lipoid disorders: Secondary | ICD-10-CM

## 2016-08-30 LAB — CBC WITH DIFFERENTIAL/PLATELET
BASOS ABS: 0 10*3/uL (ref 0.0–0.2)
Basos: 0 %
EOS (ABSOLUTE): 0.1 10*3/uL (ref 0.0–0.4)
EOS: 2 %
HEMOGLOBIN: 12.7 g/dL (ref 11.1–15.9)
Hematocrit: 40.2 % (ref 34.0–46.6)
Immature Grans (Abs): 0 10*3/uL (ref 0.0–0.1)
Immature Granulocytes: 0 %
LYMPHS ABS: 2.4 10*3/uL (ref 0.7–3.1)
LYMPHS: 48 %
MCH: 30.8 pg (ref 26.6–33.0)
MCHC: 31.6 g/dL (ref 31.5–35.7)
MCV: 98 fL — ABNORMAL HIGH (ref 79–97)
MONOCYTES: 6 %
Monocytes Absolute: 0.3 10*3/uL (ref 0.1–0.9)
NEUTROS PCT: 44 %
Neutrophils Absolute: 2.2 10*3/uL (ref 1.4–7.0)
PLATELETS: 232 10*3/uL (ref 150–379)
RBC: 4.12 x10E6/uL (ref 3.77–5.28)
RDW: 13.1 % (ref 12.3–15.4)
WBC: 5 10*3/uL (ref 3.4–10.8)

## 2016-08-30 LAB — LIPID PANEL W/O CHOL/HDL RATIO
CHOLESTEROL TOTAL: 179 mg/dL (ref 100–199)
HDL: 61 mg/dL (ref >39)
LDL CALC: 105 mg/dL — AB (ref 0–99)
TRIGLYCERIDES: 64 mg/dL (ref 0–149)
VLDL CHOLESTEROL CAL: 13 mg/dL (ref 5–40)

## 2016-09-12 ENCOUNTER — Telehealth: Payer: Self-pay

## 2016-09-12 NOTE — Telephone Encounter (Signed)
Patient states she has a watery, smelly discharged. Patient will stop by office and do a self swab today

## 2016-09-24 ENCOUNTER — Other Ambulatory Visit: Payer: Self-pay | Admitting: Obstetrics and Gynecology

## 2016-09-24 DIAGNOSIS — M79622 Pain in left upper arm: Secondary | ICD-10-CM

## 2016-09-24 DIAGNOSIS — R2232 Localized swelling, mass and lump, left upper limb: Secondary | ICD-10-CM

## 2016-09-25 ENCOUNTER — Encounter: Payer: Self-pay | Admitting: Internal Medicine

## 2016-09-25 ENCOUNTER — Ambulatory Visit (INDEPENDENT_AMBULATORY_CARE_PROVIDER_SITE_OTHER): Payer: Self-pay | Admitting: Internal Medicine

## 2016-09-25 VITALS — BP 112/76 | HR 80 | Resp 12 | Ht 64.0 in | Wt 227.0 lb

## 2016-09-25 DIAGNOSIS — R51 Headache: Secondary | ICD-10-CM

## 2016-09-25 DIAGNOSIS — R519 Headache, unspecified: Secondary | ICD-10-CM

## 2016-09-25 DIAGNOSIS — H509 Unspecified strabismus: Secondary | ICD-10-CM

## 2016-09-25 MED ORDER — IBUPROFEN 200 MG PO TABS: ORAL_TABLET | ORAL | 0 refills | Status: DC

## 2016-09-25 NOTE — Progress Notes (Signed)
   Subjective:    Patient ID: Morgan Mathews, female    DOB: [birth year], 26 y.o.   MRN: [id number]  HPI   2 weeks ago, started with pain in head.  States it's "like fire".  Points to left temple area which radiates to above her left eye and also to anterior, inferior and retroauricular areas on left.   Pain comes and goes throughout the day.  Ibuprofen 400 mg does not seem to help.   Has been placing menthol rub over the area with some relief.   Pressure over the area helps.   Keeps her from doing what she needs to do when occurs.  Lasts about 5 minutes.   No history of injury to the area.  No increased stress recently.   States it does hurt to open her mouth or laugh.   She is not aware of grinding or clenching her teeth.   No fevers, no chills. No nausea or vomiting.   May be having double vision, but if she presses her head, this goes away.   No facial droop or focal muscle weakness, no numbness, tingling or weakness.    Current Meds  Medication Sig  . ibuprofen (ADVIL,MOTRIN) 200 MG tablet 2-4 tabs by mouth every 6 hours as needed for chest and back pain  . levonorgestrel (MIRENA, 52 MG,) 20 MCG/24HR IUD 1 each by Intrauterine route once.   No Known Allergies    Review of Systems     Objective:   Physical Exam   NAD HEENT:   PERRL, EOMI, though does have a very mild right exotropia when elevates eyebrows.  TMs pearly gray, throat without injection, Does have significant wear of enamel from bite surfaces of teeth, though no particular pattern of wear.   Tender on palpation of soft tissue overlying left temple area.  No palpable swelling of parotid gland Neck:  Tender over left sternocleidomastoid muscle, but otherwise supple, No thyroid tenderness Chest:  CTA CV:  RRR without murmur or rub, radial and DP pulses normal and equal Neuro:  A & O x 3, CN 2-12 grossly intact, Motor 5/5, DTRs 2+/4, gait normal.  Sensory grossly normal          Assessment & Plan:  Left  sided head and face pain:  Feel this is mainly musculoskeletal in origin/soft tissue.   To check with husband if grinding teeth.  To try ibuprofen 600-800 mg twice daily with food for the next 2-3 days. If she is grinding to let me know and can try a bite block for sleep. She has been under a lot of pressure recently, though denies any increase in stress. To call if new symptoms

## 2016-10-03 ENCOUNTER — Other Ambulatory Visit: Payer: Self-pay | Admitting: Licensed Clinical Social Worker

## 2016-10-08 ENCOUNTER — Ambulatory Visit
Admission: RE | Admit: 2016-10-08 | Discharge: 2016-10-08 | Disposition: A | Discharge status: Home / Self Care | Payer: No Typology Code available for payment source | Source: Ambulatory Visit | Attending: Obstetrics and Gynecology | Admitting: Obstetrics and Gynecology

## 2016-10-08 ENCOUNTER — Other Ambulatory Visit: Payer: Self-pay | Admitting: Obstetrics and Gynecology

## 2016-10-08 ENCOUNTER — Ambulatory Visit (HOSPITAL_COMMUNITY)
Admission: RE | Admit: 2016-10-08 | Discharge: 2016-10-08 | Disposition: A | Discharge status: Home / Self Care | Payer: Self-pay | Source: Ambulatory Visit | Attending: Obstetrics and Gynecology | Admitting: Obstetrics and Gynecology

## 2016-10-08 ENCOUNTER — Other Ambulatory Visit: Payer: Self-pay

## 2016-10-08 ENCOUNTER — Encounter (HOSPITAL_COMMUNITY): Payer: Self-pay

## 2016-10-08 VITALS — BP 118/66 | HR 64 | Temp 97.7°F | Ht 65.0 in | Wt 219.7 lb

## 2016-10-08 DIAGNOSIS — R2232 Localized swelling, mass and lump, left upper limb: Secondary | ICD-10-CM

## 2016-10-08 DIAGNOSIS — M79622 Pain in left upper arm: Secondary | ICD-10-CM

## 2016-10-08 DIAGNOSIS — N644 Mastodynia: Secondary | ICD-10-CM

## 2016-10-08 DIAGNOSIS — N6310 Unspecified lump in the right breast, unspecified quadrant: Secondary | ICD-10-CM

## 2016-10-08 DIAGNOSIS — Z1239 Encounter for other screening for malignant neoplasm of breast: Secondary | ICD-10-CM

## 2016-10-08 NOTE — Patient Instructions (Addendum)
Explained breast self exam with Morgan Mathews. Patient did not need a Pap smear today due to last Pap smear was 01/17/2014. Let her know [facility] will cover Pap smears every 3 years unless has a history of abnormal Pap smears. Reminded patient that her next Pap smear is due in December 2018 and to call to schedule with [facility]. [facility]. Appointment scheduled for Tuesday, October 08, 2016 at 1250. Morgan Mathews verbalized understanding.  [name], [name], RN 2:07 PM

## 2016-10-08 NOTE — Progress Notes (Signed)
Complaints of bilateral breast pain and lumps. Patient states the left breast lump and pain was first noticed 6 months ago. Patient states the right breast lump and pain was first noticed 3 years ago. Patient states the pain comes and goes bilateral breasts. Patient rates the pain within bilateral breasts at a 5 out of 10.   Pap Smear: Pap smear not completed today. [facility] and normal. Per patient has no history of an abnormal Pap smear. Last Pap smear result is in EPIC.  Physical exam: Breasts Right breast larger than left breast that per patient is normal for her. No skin abnormalities bilateral breasts. No nipple retraction bilateral breasts. No nipple discharge bilateral breasts. No lymphadenopathy. No lumps palpated left breast. Palpated a pea sized lump within the right breast at 11:30 o'clock 15 cm from the nipple. Complaints of pain left breast at 1 o'clock 16 cm from the nipple and right breast at 11 o'clock 17 cm from the nipple. [facility] ultras[facility]. Appointment scheduled for Tuesday, October 08, 2016 at 1250.        Pelvic/Bimanual No Pap smear completed today since last Pap smear was 01/17/2014. Pap smear not indicated per [facility] guidelines.   Smoking History: Patient has never smoked.  Patient Navigation: Patient education provided. Access to services provided for patient through [facility].

## 2016-11-25 ENCOUNTER — Ambulatory Visit: Payer: Self-pay | Admitting: Internal Medicine

## 2017-01-28 NOTE — L&D Delivery Note (Signed)
  Delivery Note At 10:10 PM a viable female was delivered via Vaginal, Spontaneous (Presentation: LOA;  ).  APGAR: 9, 9; weight pending .    Anesthesia:   epidurla Episiotomy:  none Lacerations:  none Suture Repair:  Est. Blood Loss (mL):  5 cc  Mom to postpartum.  Baby to Couplet care / Skin to Skin.  Delivery assisted by Elsie Lincoln, med student  Jacklyn Shell 10/30/2017, 10:38 PM

## 2017-03-31 ENCOUNTER — Other Ambulatory Visit: Payer: Self-pay

## 2017-03-31 ENCOUNTER — Emergency Department (HOSPITAL_COMMUNITY)
Admission: EM | Admit: 2017-03-31 | Discharge: 2017-04-01 | Disposition: A | Discharge status: Home / Self Care | Payer: Medicaid Other | Attending: Emergency Medicine | Admitting: Emergency Medicine

## 2017-03-31 ENCOUNTER — Encounter (HOSPITAL_COMMUNITY): Payer: Self-pay

## 2017-03-31 DIAGNOSIS — Z3A08 8 weeks gestation of pregnancy: Secondary | ICD-10-CM | POA: Diagnosis not present

## 2017-03-31 DIAGNOSIS — R102 Pelvic and perineal pain: Secondary | ICD-10-CM | POA: Diagnosis not present

## 2017-03-31 DIAGNOSIS — Z3491 Encounter for supervision of normal pregnancy, unspecified, first trimester: Secondary | ICD-10-CM

## 2017-03-31 DIAGNOSIS — O210 Mild hyperemesis gravidarum: Secondary | ICD-10-CM | POA: Insufficient documentation

## 2017-03-31 DIAGNOSIS — R42 Dizziness and giddiness: Secondary | ICD-10-CM | POA: Diagnosis not present

## 2017-03-31 LAB — CBC
HCT: 36.9 % (ref 36.0–46.0)
HEMOGLOBIN: 12.8 g/dL (ref 12.0–15.0)
MCH: 32.2 pg (ref 26.0–34.0)
MCHC: 34.7 g/dL (ref 30.0–36.0)
MCV: 92.7 fL (ref 78.0–100.0)
Platelets: 266 10*3/uL (ref 150–400)
RBC: 3.98 MIL/uL (ref 3.87–5.11)
RDW: 12.1 % (ref 11.5–15.5)
WBC: 6.4 10*3/uL (ref 4.0–10.5)

## 2017-03-31 LAB — I-STAT TROPONIN, ED: TROPONIN I, POC: 0.01 ng/mL (ref 0.00–0.08)

## 2017-03-31 LAB — BASIC METABOLIC PANEL
ANION GAP: 16 — AB (ref 5–15)
BUN: 5 mg/dL — ABNORMAL LOW (ref 6–20)
CHLORIDE: 97 mmol/L — AB (ref 101–111)
CO2: 20 mmol/L — ABNORMAL LOW (ref 22–32)
Calcium: 9.5 mg/dL (ref 8.9–10.3)
Creatinine, Ser: 0.66 mg/dL (ref 0.44–1.00)
GFR calc non Af Amer: >60 mL/min (ref >60)
Glucose, Bld: 78 mg/dL (ref 65–99)
Potassium: 3.2 mmol/L — ABNORMAL LOW (ref 3.5–5.1)
Sodium: 133 mmol/L — ABNORMAL LOW (ref 135–145)

## 2017-03-31 LAB — I-STAT BETA HCG BLOOD, ED (MC, WL, AP ONLY)

## 2017-03-31 NOTE — ED Triage Notes (Signed)
Pt endorses central non radiating chest pain with nausea and shob x 8 weeks "since I've been pregnant" VSS.

## 2017-04-01 ENCOUNTER — Emergency Department (HOSPITAL_COMMUNITY): Payer: Medicaid Other

## 2017-04-01 LAB — URINALYSIS, ROUTINE W REFLEX MICROSCOPIC
BILIRUBIN URINE: NEGATIVE
GLUCOSE, UA: NEGATIVE mg/dL
HGB URINE DIPSTICK: NEGATIVE
KETONES UR: 80 mg/dL — AB
LEUKOCYTES UA: NEGATIVE
NITRITE: NEGATIVE
PROTEIN: 100 mg/dL — AB
Specific Gravity, Urine: 1.031 — ABNORMAL HIGH (ref 1.005–1.030)
pH: 5 (ref 5.0–8.0)

## 2017-04-01 LAB — HCG, QUANTITATIVE, PREGNANCY: HCG, BETA CHAIN, QUANT, S: 176095 m[IU]/mL — AB (ref <5)

## 2017-04-01 LAB — HEPATIC FUNCTION PANEL
ALK PHOS: 67 U/L (ref 38–126)
ALT: 113 U/L — ABNORMAL HIGH (ref 14–54)
AST: 90 U/L — AB (ref 15–41)
Albumin: 4.2 g/dL (ref 3.5–5.0)
Bilirubin, Direct: 0.2 mg/dL (ref 0.1–0.5)
Indirect Bilirubin: 1.2 mg/dL — ABNORMAL HIGH (ref 0.3–0.9)
TOTAL PROTEIN: 8.2 g/dL — AB (ref 6.5–8.1)
Total Bilirubin: 1.4 mg/dL — ABNORMAL HIGH (ref 0.3–1.2)

## 2017-04-01 LAB — LIPASE, BLOOD: Lipase: 22 U/L (ref 11–51)

## 2017-04-01 MED ORDER — PRENATAL COMPLETE 14-0.4 MG PO TABS: 1.0000 | ORAL_TABLET | Freq: Every day | ORAL | 0 refills | Status: DC

## 2017-04-01 MED ORDER — ONDANSETRON 4 MG PO TBDP: 4.0000 mg | ORAL_TABLET | Freq: Three times a day (TID) | ORAL | 0 refills | Status: DC | PRN

## 2017-04-01 MED ORDER — SODIUM CHLORIDE 0.9 % IV BOLUS (SEPSIS)
1000.0000 mL | Freq: Once | INTRAVENOUS | Status: AC
  Administered 2017-04-01: 1000 mL via INTRAVENOUS

## 2017-04-01 MED ORDER — LIDOCAINE VISCOUS 2 % MT SOLN
15.0000 mL | Freq: Once | OROMUCOSAL | Status: AC
  Administered 2017-04-01: 15 mL via OROMUCOSAL
  Filled 2017-04-01: qty 15

## 2017-04-01 MED ORDER — ALUM & MAG HYDROXIDE-SIMETH 200-200-20 MG/5ML PO SUSP
15.0000 mL | Freq: Once | ORAL | Status: AC
  Administered 2017-04-01: 15 mL via ORAL
  Filled 2017-04-01: qty 30

## 2017-04-01 MED ORDER — ONDANSETRON HCL 4 MG/2ML IJ SOLN
4.0000 mg | Freq: Once | INTRAMUSCULAR | Status: AC
  Administered 2017-04-01: 4 mg via INTRAVENOUS
  Filled 2017-04-01: qty 2

## 2017-04-01 NOTE — ED Provider Notes (Addendum)
[facility] EMERGENCY [facility] Provider Note   CSN: [id number] Arrival date & time: 03/31/17  1818     History   Chief Complaint Chief Complaint  Patient presents with  . Chest Pain    HPI Morgan Mathews is a 27 y.o. female.  Patient presents with chest pain in the center of her chest that started after copious vomiting associated with pregnancy. Symptoms have been ongoing for about 8 weeks with pain at its worst in the last 2-3 days. No fever. She reports cough that is nonproductive. She had excessive vomiting with 2 prior pregnancies but otherwise uncomplicated perinatal courses. She denies vaginal bleeding or discharge but is now having lower abdominal discomfort with dizziness. No urinary symptoms. She has not started prenatal care yet.    The history is provided by the patient. No language interpreter was used.  Chest Pain   Associated symptoms include abdominal pain (C/O lower abdominal discomfort.), cough, dizziness, nausea and vomiting. Pertinent negatives include no back pain, no fever, no headaches and no shortness of breath.    Past Medical History:  Diagnosis Date  . 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 Chronic bilateral thoracic back pain 08/28/2016  . Right-sided chest wall pain 08/28/2016  . Breast pain, right 05/10/2014  . Strabismus 01/29/1992    Past Surgical History:  Procedure Laterality Date  . NO PAST SURGERIES      OB History    Gravida Para Term Preterm AB Living   3 2 2  0 0 2   SAB TAB Ectopic Multiple Live Births   0 0 0 0 2       Home Medications    Prior to Admission medications   Medication Sig Start Date End Date Taking? Authorizing Provider  famotidine (PEPCID) 20 MG tablet 2 tabs by mouth at bedtime daily 08/28/16 08/28/16  Julieanne MansonMulberry, Elizabeth, MD  ibuprofen (ADVIL,MOTRIN) 200 MG tablet 3-4 tabs by mouth twice daily with meal for next 2-3 days. 09/25/16    Julieanne MansonMulberry, Elizabeth, MD  levonorgestrel (MIRENA, 52 MG,) 20 MCG/24HR IUD 1 each by Intrauterine route once.    [provider]    Family History History reviewed. No pertinent family history.  Social History Social History   Tobacco Use  . Smoking status: Never Smoker  . Smokeless tobacco: Never Used  Substance Use Topics  . Alcohol use: No  . Drug use: No     Allergies   Patient has no known allergies.   Review of Systems Review of Systems  Constitutional: Negative for chills and fever.  HENT: Negative.   Respiratory: Positive for cough. Negative for shortness of breath.   Cardiovascular: Positive for chest pain.  Gastrointestinal: Positive for abdominal pain (C/O lower abdominal discomfort.), nausea and vomiting.  Genitourinary: Negative.  Negative for dysuria, vaginal bleeding and vaginal discharge.  Musculoskeletal: Negative.  Negative for back pain.  Skin: Negative.   Neurological: Positive for dizziness. Negative for syncope and headaches.     Physical Exam Updated Vital Signs BP 120/80 (BP Location: Left Arm)   Pulse 81   Temp 98.6 F (37 C) (Oral)   Resp 18   Ht 5\' 3"  (1.6 m)   Wt 100.7 kg (222 lb)   LMP 02/06/2017 (Exact Date)   SpO2 99%   BMI 39.33 kg/m   Physical Exam  Constitutional: She is oriented to person, place, and time. She appears well-developed and well-nourished.  HENT:  Head: Normocephalic.  Neck: Normal range of motion. Neck supple.  Cardiovascular: Normal rate and regular rhythm.  Pulmonary/Chest: Effort normal and breath sounds normal. She has no wheezes. She has no rales.  Abdominal: Soft. Bowel sounds are normal. There is no tenderness. There is no rebound and no guarding.  Musculoskeletal: Normal range of motion.  Neurological: She is alert and oriented to person, place, and time.  Skin: Skin is warm and dry. No rash noted.  Psychiatric: She has a normal mood and affect.     ED Treatments / Results  Labs (all  labs ordered are listed, but only abnormal results are displayed) Labs Reviewed  BASIC METABOLIC PANEL - Abnormal; Notable for the following components:      Result Value   Sodium 133 (*)    Potassium 3.2 (*)    Chloride 97 (*)    CO2 20 (*)    BUN 5 (*)    Anion gap 16 (*)    All other components within normal limits  I-STAT BETA HCG BLOOD, ED ([facility], [facility], [facility] ONLY) - Abnormal; Notable for the following components:   I-stat hCG, quantitative >2,000.0 (*)    All other components within normal limits  CBC  HCG, QUANTITATIVE, PREGNANCY  HEPATIC FUNCTION PANEL  LIPASE, BLOOD  URINALYSIS, ROUTINE W REFLEX MICROSCOPIC  I-STAT TROPONIN, ED   Results for orders placed or performed during the hospital encounter of 03/31/17  Basic metabolic panel  Result Value Ref Range   Sodium 133 (L) 135 - 145 mmol/L   Potassium 3.2 (L) 3.5 - 5.1 mmol/L   Chloride 97 (L) 101 - 111 mmol/L   CO2 20 (L) 22 - 32 mmol/L   Glucose, Bld 78 65 - 99 mg/dL   BUN 5 (L) 6 - 20 mg/dL   Creatinine, Ser 2.84 0.44 - 1.00 mg/dL   Calcium 9.5 8.9 - 13.2 mg/dL   GFR calc non Af Amer >60 >60 mL/min   GFR calc Af Amer >60 >60 mL/min   Anion gap 16 (H) 5 - 15  CBC  Result Value Ref Range   WBC 6.4 4.0 - 10.5 K/uL   RBC 3.98 3.87 - 5.11 MIL/uL   Hemoglobin 12.8 12.0 - 15.0 g/dL   HCT 44.0 10.2 - 72.5 %   MCV 92.7 78.0 - 100.0 fL   MCH 32.2 26.0 - 34.0 pg   MCHC 34.7 30.0 - 36.0 g/dL   RDW 36.6 44.0 - 34.7 %   Platelets 266 150 - 400 K/uL  hCG, quantitative, pregnancy  Result Value Ref Range   hCG, Beta Chain, Quant, S 176,095 (H) <5 mIU/mL  Hepatic function panel  Result Value Ref Range   Total Protein 8.2 (H) 6.5 - 8.1 g/dL   Albumin 4.2 3.5 - 5.0 g/dL   AST 90 (H) 15 - 41 U/L   ALT 113 (H) 14 - 54 U/L   Alkaline Phosphatase 67 38 - 126 U/L   Total Bilirubin 1.4 (H) 0.3 - 1.2 mg/dL   Bilirubin, Direct 0.2 0.1 - 0.5 mg/dL   Indirect Bilirubin 1.2 (H) 0.3 - 0.9 mg/dL  Lipase, blood  Result Value Ref Range    Lipase 22 11 - 51 U/L  Urinalysis, Routine w reflex microscopic  Result Value Ref Range   Color, Urine AMBER (A) YELLOW   APPearance HAZY (A) CLEAR   Specific Gravity, Urine 1.031 (H) 1.005 - 1.030   pH 5.0 5.0 - 8.0   Glucose, UA NEGATIVE  NEGATIVE mg/dL   Hgb urine dipstick NEGATIVE NEGATIVE   Bilirubin Urine NEGATIVE NEGATIVE   Ketones, ur 80 (A) NEGATIVE mg/dL   Protein, ur 161 (A) NEGATIVE mg/dL   Nitrite NEGATIVE NEGATIVE   Leukocytes, UA NEGATIVE NEGATIVE   RBC / HPF 0-5 0 - 5 RBC/hpf   WBC, UA 0-5 0 - 5 WBC/hpf   Bacteria, UA RARE (A) NONE SEEN   Squamous Epithelial / LPF 6-30 (A) NONE SEEN   Mucus PRESENT    Hyaline Casts, UA PRESENT   I-stat troponin, ED  Result Value Ref Range   Troponin i, poc 0.01 0.00 - 0.08 ng/mL   Comment 3          I-Stat beta hCG blood, ED  Result Value Ref Range   I-stat hCG, quantitative >2,000.0 (H) <5 mIU/mL   Comment 3            EKG  EKG Interpretation None       Radiology No results found. US Ob Comp Less 14 Wks  Result Date: 04/01/2017 CLINICAL DATA:  Pregnant patient in first-trimester pregnancy with pelvic pain for 2 days. EXAM: OBSTETRIC <14 WK [place] AND TRANSVAGINAL OB US TECHNIQUE: Both transabdominal and transvaginal ultrasound examinations were performed for complete evaluation of the gestation as well as the maternal uterus, adnexal regions, and pelvic cul-de-sac. Transvaginal technique was performed to assess early pregnancy. COMPARISON:  None this pregnancy. FINDINGS: Intrauterine gestational sac: Single Yolk sac:  Visualized. Embryo:  Visualized. Cardiac Activity: Visualized. Heart Rate: 160 bpm CRL:  15.8 mm   8 w   0 d                  [place] EDC: 11/11/2017 Subchorionic hemorrhage:  None visualized. Maternal uterus/adnexae: Both ovaries are visualized and are normal. Corpus luteal cyst in the left ovary. No adnexal mass or pelvic free fluid. IMPRESSION: Single live intrauterine pregnancy estimated gestational age [redacted] weeks 0 days  based on crown-rump length for estimated date of delivery 11/11/2017. No subchorionic hemorrhage. Electronically Signed   By: Rubye Oaks M.D.   On: 04/01/2017 04:05   US Ob Transvaginal  Result Date: 04/01/2017 CLINICAL DATA:  Pregnant patient in first-trimester pregnancy with pelvic pain for 2 days. EXAM: OBSTETRIC <14 WK [place] AND TRANSVAGINAL OB US TECHNIQUE: Both transabdominal and transvaginal ultrasound examinations were performed for complete evaluation of the gestation as well as the maternal uterus, adnexal regions, and pelvic cul-de-sac. Transvaginal technique was performed to assess early pregnancy. COMPARISON:  None this pregnancy. FINDINGS: Intrauterine gestational sac: Single Yolk sac:  Visualized. Embryo:  Visualized. Cardiac Activity: Visualized. Heart Rate: 160 bpm CRL:  15.8 mm   8 w   0 d                  [place] EDC: 11/11/2017 Subchorionic hemorrhage:  None visualized. Maternal uterus/adnexae: Both ovaries are visualized and are normal. Corpus luteal cyst in the left ovary. No adnexal mass or pelvic free fluid. IMPRESSION: Single live intrauterine pregnancy estimated gestational age [redacted] weeks 0 days based on crown-rump length for estimated date of delivery 11/11/2017. No subchorionic hemorrhage. Electronically Signed   By: Rubye Oaks M.D.   On: 04/01/2017 04:05   edures Procedures (including critical care time)  Medications Ordered in ED Medications  sodium chloride 0.9 % bolus 1,000 mL (1,000 mLs Intravenous New Bag/Given 04/01/17 0050)  ondansetron (ZOFRAN) injection 4 mg (4 mg Intravenous Given 04/01/17 0050)  lidocaine (XYLOCAINE) 2 % viscous mouth  solution 15 mL (15 mLs Mouth/Throat Given 04/01/17 0050)  alum & mag hydroxide-simeth (MAALOX/MYLANTA) 200-200-20 MG/5ML suspension 15 mL (15 mLs Oral Given 04/01/17 0050)     Initial Impression / Assessment and Plan / ED Course  I have reviewed the triage vital signs and the nursing notes.  Pertinent labs & imaging results that were  available during my care of the patient were reviewed by me and considered in my medical decision making (see chart for details).     Patient presents with nausea and vomiting she associated with pregnancy for the past 8 weeks. She reports similar emesis with previous 2 pregnancies. After vomiting started, she developed pain in the center of her chest like a burning. This pain was relieved with a GI cocktail (no Donnatal).   She has been given IVF's. OB US shows a viable IUP of 8 w 0 d, with fetal HR 160.   She is tolerating PO fluids without difficulty. She states she is started prenatal care soon. Vitamins will be started here. Will provide Zofran for home use.   Final Clinical Impressions(s) / ED Diagnoses   Final diagnoses:  None   1. Hyperemesis gravidarum 2. IUP [redacted]w[redacted]d  ED Discharge Orders    None       Elpidio Anis, PA-C 04/01/17 0506    Elpidio Anis, PA-C 04/01/17 [id number]    Geoffery Lyons, MD 04/01/17 [phone]

## 2017-04-01 NOTE — Discharge Instructions (Signed)
Take Zofran for nausea. Keep your scheduled appointment with your OB for baby care.

## 2017-04-07 ENCOUNTER — Inpatient Hospital Stay (HOSPITAL_COMMUNITY)
Admission: AD | Admit: 2017-04-07 | Discharge: 2017-04-07 | Disposition: A | Discharge status: Home / Self Care | Payer: Medicaid Other | Source: Ambulatory Visit | Attending: Obstetrics & Gynecology | Admitting: Obstetrics & Gynecology

## 2017-04-07 ENCOUNTER — Ambulatory Visit (INDEPENDENT_AMBULATORY_CARE_PROVIDER_SITE_OTHER): Payer: Medicaid Other | Admitting: Advanced Practice Midwife

## 2017-04-07 ENCOUNTER — Encounter (HOSPITAL_COMMUNITY): Payer: Self-pay | Admitting: *Deleted

## 2017-04-07 ENCOUNTER — Encounter: Payer: Self-pay | Admitting: Advanced Practice Midwife

## 2017-04-07 DIAGNOSIS — O219 Vomiting of pregnancy, unspecified: Secondary | ICD-10-CM

## 2017-04-07 DIAGNOSIS — Z8669 Personal history of other diseases of the nervous system and sense organs: Secondary | ICD-10-CM | POA: Diagnosis not present

## 2017-04-07 DIAGNOSIS — Z3A09 9 weeks gestation of pregnancy: Secondary | ICD-10-CM | POA: Insufficient documentation

## 2017-04-07 DIAGNOSIS — O210 Mild hyperemesis gravidarum: Secondary | ICD-10-CM | POA: Diagnosis present

## 2017-04-07 LAB — COMPREHENSIVE METABOLIC PANEL
ALBUMIN: 4.7 g/dL (ref 3.5–5.0)
ALK PHOS: 96 U/L (ref 38–126)
ALT: 649 U/L — AB (ref 14–54)
ANION GAP: 16 — AB (ref 5–15)
AST: 450 U/L — AB (ref 15–41)
BUN: 8 mg/dL (ref 6–20)
CALCIUM: 9.8 mg/dL (ref 8.9–10.3)
CO2: 20 mmol/L — ABNORMAL LOW (ref 22–32)
CREATININE: 0.56 mg/dL (ref 0.44–1.00)
Chloride: 95 mmol/L — ABNORMAL LOW (ref 101–111)
GFR calc Af Amer: >60 mL/min (ref >60)
GFR calc non Af Amer: >60 mL/min (ref >60)
GLUCOSE: 107 mg/dL — AB (ref 65–99)
Potassium: 3 mmol/L — ABNORMAL LOW (ref 3.5–5.1)
Sodium: 131 mmol/L — ABNORMAL LOW (ref 135–145)
Total Bilirubin: 5.7 mg/dL — ABNORMAL HIGH (ref 0.3–1.2)
Total Protein: 8.8 g/dL — ABNORMAL HIGH (ref 6.5–8.1)

## 2017-04-07 LAB — URINALYSIS, MICROSCOPIC (REFLEX)

## 2017-04-07 LAB — URINALYSIS, ROUTINE W REFLEX MICROSCOPIC
Glucose, UA: 100 mg/dL — AB
KETONES UR: 15 mg/dL — AB
NITRITE: POSITIVE — AB
PROTEIN: 100 mg/dL — AB
Specific Gravity, Urine: 1.01 (ref 1.005–1.030)
pH: 6.5 (ref 5.0–8.0)

## 2017-04-07 LAB — CBC
HEMATOCRIT: 40 % (ref 36.0–46.0)
HEMOGLOBIN: 14.4 g/dL (ref 12.0–15.0)
MCH: 31.8 pg (ref 26.0–34.0)
MCHC: 36 g/dL (ref 30.0–36.0)
MCV: 88.3 fL (ref 78.0–100.0)
Platelets: 294 10*3/uL (ref 150–400)
RBC: 4.53 MIL/uL (ref 3.87–5.11)
RDW: 12.5 % (ref 11.5–15.5)
WBC: 6.2 10*3/uL (ref 4.0–10.5)

## 2017-04-07 MED ORDER — PROMETHAZINE HCL 25 MG PO TABS: 25.0000 mg | ORAL_TABLET | Freq: Four times a day (QID) | ORAL | 0 refills | Status: DC | PRN

## 2017-04-07 MED ORDER — SCOPOLAMINE 1 MG/3DAYS TD PT72: 1.0000 | MEDICATED_PATCH | TRANSDERMAL | 12 refills | Status: DC

## 2017-04-07 MED ORDER — SCOPOLAMINE 1 MG/3DAYS TD PT72
1.0000 | MEDICATED_PATCH | TRANSDERMAL | Status: DC
  Administered 2017-04-07: 1.5 mg via TRANSDERMAL
  Filled 2017-04-07: qty 1

## 2017-04-07 MED ORDER — SODIUM CHLORIDE 0.9 % IV SOLN
25.0000 mg | Freq: Once | INTRAVENOUS | Status: AC
  Administered 2017-04-07: 25 mg via INTRAVENOUS
  Filled 2017-04-07: qty 1

## 2017-04-07 MED ORDER — SODIUM CHLORIDE 0.9 % IV SOLN
Freq: Once | INTRAVENOUS | Status: AC
  Administered 2017-04-07: 19:00:00 via INTRAVENOUS
  Filled 2017-04-07: qty 1000

## 2017-04-07 NOTE — MAU Note (Signed)
Pt reports she is vomiting all the time, unable to keep anything down. Vomiting blood at times, feels weak isn't sleeping well.

## 2017-04-07 NOTE — Progress Notes (Signed)
Patient here for NOB visit. Unable to complete visit at this time. Patient with N/V. She reports that this has been ongoing since she found out she was pregnant. However, today she has vomited approx 5x while attempting to get to a room. She is unable to leave a urine sample to assess hydration. However, likely she is dehydrated given that this has been ongoing for a couple of week. Marylynn Pearsonarrie Hillman, RN performed US, and saw + fetal cardiac activity. MAU notified, and patient escorted to MAU by RN. New OB visit rescheduled. RX and prior authorization process started to diclegis.   Thressa ShellerHeather Thu Baggett 2:25 PM 04/07/17

## 2017-04-07 NOTE — MAU Provider Note (Addendum)
History     CSN: [id number]  Arrival date and time: 04/07/17 1429   First Provider Initiated Contact with Patient 04/07/17 1700      Chief Complaint  Patient presents with  . Emesis  . Nausea   HPI Morgan Mathews 26 y.o. [redacted]w[redacted]d  Went to clinic today for NOB appointment but was vomiting so much the only thing that could be done was to verify FHT with ultrasound.  She has been vomiting for several weeks.  She is out of medication that she got when she was at the ED on 04-01-17 (treated with Zofran).  Previous notes reviewed.    OB History    Gravida Para Term Preterm AB Living   3 2 2  0 0 2   SAB TAB Ectopic Multiple Live Births   0 0 0 0 2      Past Medical History:  Diagnosis Date  . 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 NO PAST SURGERIES      History reviewed. No pertinent family history.  Social History   Tobacco Use  . Smoking status: Never Smoker  . Smokeless tobacco: Never Used  Substance Use Topics  . Alcohol use: No  . Drug use: No    Allergies: No Known Allergies  Medications Prior to Admission  Medication Sig Dispense Refill Last Dose  . famotidine (PEPCID) 20 MG tablet 2 tabs by mouth at bedtime daily 60 tablet 3   . ibuprofen (ADVIL,MOTRIN) 200 MG tablet 3-4 tabs by mouth twice daily with meal for next 2-3 days. 30 tablet 0   . levonorgestrel (MIRENA, 52 MG,) 20 MCG/24HR IUD 1 each by Intrauterine route once.   Taking  . ondansetron (ZOFRAN ODT) 4 MG disintegrating tablet Take 1 tablet (4 mg total) by mouth every 8 (eight) hours as needed for nausea or vomiting. 20 tablet 0   . Prenatal Vit-Fe Fumarate-FA (PRENATAL COMPLETE) 14-0.4 MG TABS Take 1 tablet by mouth daily. 30 each 0     Review of Systems  Constitutional: Negative for fever.  Gastrointestinal: Positive for nausea and vomiting.  Genitourinary: Negative for dysuria and flank pain.   Physical Exam   Blood pressure 122/81, pulse  95, temperature 98.9 F (37.2 C), resp. rate 18, height 5\' 3"  (1.6 m), weight 213 lb (96.6 kg), last menstrual period 02/06/2017, SpO2 100 %, not currently breastfeeding.  Physical Exam  Nursing note and vitals reviewed. Constitutional: She is oriented to person, place, and time. She appears well-developed and well-nourished.  HENT:  Head: Normocephalic.  Eyes: EOM are normal.  Neck: Neck supple.  Respiratory: Effort normal.  GI: Soft. There is no tenderness. There is no rebound and no guarding.  Musculoskeletal: Normal range of motion.  No CVA tenderness  Neurological: She is alert and oriented to person, place, and time.  Skin: Skin is warm and dry.  Psychiatric: She has a normal mood and affect.    MAU Course  Procedures Results for orders placed or performed during the hospital encounter of 04/07/17 (from the past 24 hour(s))  Urinalysis, Routine w reflex microscopic     Status: Abnormal   Collection Time: 04/07/17  3:12 PM  Result Value Ref Range   Color, Urine ORANGE (A) YELLOW   APPearance CLOUDY (A) CLEAR   Specific Gravity, Urine 1.010 1.005 - 1.030   pH 6.5 5.0 - 8.0   Glucose, UA 100 (A) NEGATIVE mg/dL   Hgb urine  dipstick TRACE (A) NEGATIVE   Bilirubin Urine LARGE (A) NEGATIVE   Ketones, ur 15 (A) NEGATIVE mg/dL   Protein, ur 100 (A) NEGATIVE mg/dL   Nitrite POSITIVE (A) 829EGATIVE   Leukocytes, UA MODERATE (A) NEGATIVE  Urinalysis, Microscopic (reflex)     Status: Abnormal   Collection Time: 04/07/17  3:12 PM  Result Value Ref Range   RBC / HPF 0-5 0 - 5 RBC/hpf   WBC, UA 0-5 0 - 5 WBC/hpf   Bacteria, UA MANY (A) NONE SEEN   Squamous Epithelial / LPF TOO NUMEROUS TO COUNT (A) NONE SEEN   Ca Oxalate Crys, UA PRESENT   CBC     Status: None   Collection Time: 04/07/17  4:34 PM  Result Value Ref Range   WBC 6.2 4.0 - 10.5 K/uL   RBC 4.53 3.87 - 5.11 MIL/uL   Hemoglobin 14.4 12.0 - 15.0 g/dL   HCT 56.240.0 13.036.0 - 86.546.0 %   MCV 88.3 78.0 - 100.0 fL   MCH 31.8 26.0  - 34.0 pg   MCHC 36.0 30.0 - 36.0 g/dL   RDW 78.412.5 69.611.5 - 29.515.5 %   Platelets 294 150 - 400 K/uL  Comprehensive metabolic panel     Status: Abnormal   Collection Time: 04/07/17  4:34 PM  Result Value Ref Range   Sodium 131 (L) 135 - 145 mmol/L   Potassium 3.0 (L) 3.5 - 5.1 mmol/L   Chloride 95 (L) 101 - 111 mmol/L   CO2 20 (L) 22 - 32 mmol/L   Glucose, Bld 107 (H) 65 - 99 mg/dL   BUN 8 6 - 20 mg/dL   Creatinine, Ser 2.840.56 0.44 - 1.00 mg/dL   Calcium 9.8 8.9 - 13.210.3 mg/dL   Total Protein 8.8 (H) 6.5 - 8.1 g/dL   Albumin 4.7 3.5 - 5.0 g/dL   AST 440450 (H) 15 - 41 U/L   ALT 649 (H) 14 - 54 U/L   Alkaline Phosphatase 96 38 - 126 U/L   Total Bilirubin 5.7 (H) 0.3 - 1.2 mg/dL   GFR calc non Af Amer >60 >60 mL/min   GFR calc Af Amer >60 >60 mL/min   Anion gap 16 (H) 5 - 15    MDM Reviewed plan of care with Dr. Alysia PennaErvin  - will try scopolamine patch to see if it will help with her vomiting.  Will await pending urine culture before treating positive nitrates in urine.   Given IV fluids with phenergan and one bag with multivitamins.  Assessment and Plan  Morning sickness  Plan NOB appointment rescheduled for 04-21-17 at 10:35 am Will prescribe Scopolamine patches to change every 72 hours. Will prescribe phenergan 25 mg tablets to use if still vomiting. Liquids today and advance slowly as tolerated.   Terri L Burleson 04/07/2017, 6:18 PM

## 2017-04-07 NOTE — Progress Notes (Addendum)
Bedside ultrasound performed for FHR. FHR 170 bpm

## 2017-04-07 NOTE — Discharge Instructions (Signed)
Eat bland foods like rice, bananas, toast, applesauce and advance slowly as tolerated. Eat small amounts every 2-3 hours. Do not eat fried foods or spicy foods - no pizza or hamburgers. Keep your appointment in the clinic on March 25 at 10:35 am. Get the medicine at your pharmacy. Drink at least 8 8-oz glasses of water every day. Take Tylenol 325 mg 2 tablets by mouth every 4 hours if needed for pain.

## 2017-04-09 LAB — CULTURE, OB URINE

## 2017-04-14 ENCOUNTER — Encounter: Payer: Self-pay | Admitting: Obstetrics & Gynecology

## 2017-04-15 ENCOUNTER — Encounter: Payer: Self-pay | Admitting: *Deleted

## 2017-04-21 ENCOUNTER — Telehealth: Payer: Self-pay

## 2017-04-21 ENCOUNTER — Other Ambulatory Visit (HOSPITAL_COMMUNITY)
Admission: RE | Admit: 2017-04-21 | Discharge: 2017-04-21 | Disposition: A | Discharge status: Home / Self Care | Payer: Medicaid Other | Source: Ambulatory Visit | Attending: Advanced Practice Midwife | Admitting: Advanced Practice Midwife

## 2017-04-21 ENCOUNTER — Encounter: Payer: Self-pay | Admitting: *Deleted

## 2017-04-21 ENCOUNTER — Ambulatory Visit (INDEPENDENT_AMBULATORY_CARE_PROVIDER_SITE_OTHER): Payer: Medicaid Other | Admitting: Advanced Practice Midwife

## 2017-04-21 ENCOUNTER — Encounter: Payer: Self-pay | Admitting: Advanced Practice Midwife

## 2017-04-21 VITALS — BP 114/68 | HR 90 | Wt 228.2 lb

## 2017-04-21 DIAGNOSIS — Z3401 Encounter for supervision of normal first pregnancy, first trimester: Secondary | ICD-10-CM | POA: Diagnosis not present

## 2017-04-21 DIAGNOSIS — Z3480 Encounter for supervision of other normal pregnancy, unspecified trimester: Secondary | ICD-10-CM | POA: Insufficient documentation

## 2017-04-21 DIAGNOSIS — Z3400 Encounter for supervision of normal first pregnancy, unspecified trimester: Secondary | ICD-10-CM

## 2017-04-21 DIAGNOSIS — Z3481 Encounter for supervision of other normal pregnancy, first trimester: Secondary | ICD-10-CM

## 2017-04-21 LAB — POCT URINALYSIS DIP (DEVICE)
GLUCOSE, UA: NEGATIVE mg/dL
Hgb urine dipstick: NEGATIVE
LEUKOCYTES UA: NEGATIVE
Nitrite: NEGATIVE
PROTEIN: 30 mg/dL — AB
Specific Gravity, Urine: 1.025 (ref 1.005–1.030)
Urobilinogen, UA: 1 mg/dL (ref 0.0–1.0)
pH: 6.5 (ref 5.0–8.0)

## 2017-04-21 NOTE — Telephone Encounter (Signed)
Opened in error

## 2017-04-21 NOTE — Patient Instructions (Signed)

## 2017-04-21 NOTE — Progress Notes (Addendum)
New OB packet given  [facility] Com[facility]

## 2017-04-21 NOTE — Progress Notes (Signed)
  Subjective:    Morgan Mathews is being seen today for her first obstetrical visit.  This is not a planned pregnancy. She is at 2873w6d gestation. Her obstetrical history is significant for obesity. Relationship with FOB: spouse, living together. Patient does intend to breast feed. Pregnancy history fully reviewed.  Patient reports no complaints.  Review of Systems:   Review of Systems  All other systems reviewed and are negative.   Objective:     BP 114/68   Pulse 90   Wt 228 lb 3.2 oz (103.5 kg)   LMP 02/06/2017 (Exact Date)   BMI 40.42 kg/m  Physical Exam  Nursing note and vitals reviewed. Constitutional: She is oriented to person, place, and time. She appears well-developed and well-nourished. No distress.  HENT:  Head: Normocephalic.  Cardiovascular: Normal rate.  Respiratory: Effort normal and breath sounds normal. Right breast exhibits no inverted nipple, no mass and no nipple discharge. Left breast exhibits no inverted nipple, no mass and no nipple discharge.  GI: Soft. There is no tenderness. There is no rebound.  Genitourinary:  Genitourinary Comments: External: no lesion Vagina: small amount of white discharge Cervix: pink, smooth, no CMT Uterus: AGA   Neurological: She is alert and oriented to person, place, and time.  Skin: Skin is warm and dry.  Psychiatric: She has a normal mood and affect.    Maternal Exam:  Abdomen: Fundal height is 10.       Fetal Exam Fetal Monitor Review: Mode: hand-held doppler probe.   Baseline rate: 152.         Assessment:    Pregnancy: [id number] Patient Active Problem List   Diagnosis Date Noted  . Supervision of other normal pregnancy, antepartum 04/21/2017  . Hyperemesis affecting pregnancy, antepartum 08/21/2015       Plan:     Initial labs drawn. Panorma today  Pap today  Prenatal vitamins. Problem list reviewed and updated. AFP3 discussed: undecided. Role of ultrasound in pregnancy discussed; fetal  survey: requested. Amniocentesis discussed: not indicated. Follow up in 4 weeks. 50% of 45 min visit spent on counseling and coordination of care.     Thressa ShellerHeather Hogan 04/21/2017

## 2017-04-22 LAB — CYTOLOGY - PAP
Chlamydia: NEGATIVE
DIAGNOSIS: NEGATIVE
Neisseria Gonorrhea: NEGATIVE

## 2017-04-23 LAB — URINE CULTURE, OB REFLEX

## 2017-04-23 LAB — CULTURE, OB URINE

## 2017-04-28 LAB — OBSTETRIC PANEL, INCLUDING HIV
Antibody Screen: NEGATIVE
Basophils Absolute: 0 10*3/uL (ref 0.0–0.2)
Basos: 0 %
EOS (ABSOLUTE): 0.1 10*3/uL (ref 0.0–0.4)
Eos: 1 %
HEMOGLOBIN: 10.8 g/dL — AB (ref 11.1–15.9)
HEP B S AG: NEGATIVE
HIV Screen 4th Generation wRfx: NONREACTIVE
Hematocrit: 33 % — ABNORMAL LOW (ref 34.0–46.6)
IMMATURE GRANULOCYTES: 0 %
Immature Grans (Abs): 0 10*3/uL (ref 0.0–0.1)
Lymphocytes Absolute: 1.7 10*3/uL (ref 0.7–3.1)
Lymphs: 29 %
MCH: 32.1 pg (ref 26.6–33.0)
MCHC: 32.7 g/dL (ref 31.5–35.7)
MCV: 98 fL — AB (ref 79–97)
MONOCYTES: 5 %
Monocytes Absolute: 0.3 10*3/uL (ref 0.1–0.9)
NEUTROS ABS: 3.7 10*3/uL (ref 1.4–7.0)
NEUTROS PCT: 65 %
PLATELETS: 246 10*3/uL (ref 150–379)
RBC: 3.36 x10E6/uL — ABNORMAL LOW (ref 3.77–5.28)
RDW: 14.4 % (ref 12.3–15.4)
RPR: NONREACTIVE
RUBELLA: 9.3 {index} (ref >0.99)
Rh Factor: POSITIVE
WBC: 5.8 10*3/uL (ref 3.4–10.8)

## 2017-04-28 LAB — SMN1 COPY NUMBER ANALYSIS (SMA CARRIER SCREENING)

## 2017-04-28 LAB — HEMOGLOBIN A1C
ESTIMATED AVERAGE GLUCOSE: 77 mg/dL
Hgb A1c MFr Bld: 4.3 % — ABNORMAL LOW (ref 4.8–5.6)

## 2017-04-28 LAB — CYSTIC FIBROSIS MUTATION 97: GENE DIS ANAL CARRIER INTERP BLD/T-IMP: NOT DETECTED

## 2017-04-30 ENCOUNTER — Encounter: Payer: Self-pay | Admitting: *Deleted

## 2017-05-09 ENCOUNTER — Encounter (HOSPITAL_COMMUNITY): Payer: Self-pay | Admitting: Emergency Medicine

## 2017-05-09 ENCOUNTER — Ambulatory Visit (HOSPITAL_COMMUNITY)
Admission: EM | Admit: 2017-05-09 | Discharge: 2017-05-09 | Disposition: A | Discharge status: Home / Self Care | Payer: Medicaid Other | Attending: Urgent Care | Admitting: Urgent Care

## 2017-05-09 DIAGNOSIS — H1033 Unspecified acute conjunctivitis, bilateral: Secondary | ICD-10-CM

## 2017-05-09 DIAGNOSIS — R519 Headache, unspecified: Secondary | ICD-10-CM

## 2017-05-09 DIAGNOSIS — R51 Headache: Secondary | ICD-10-CM

## 2017-05-09 DIAGNOSIS — J0100 Acute maxillary sinusitis, unspecified: Secondary | ICD-10-CM

## 2017-05-09 MED ORDER — AMOXICILLIN 500 MG PO CAPS: 500.0000 mg | ORAL_CAPSULE | Freq: Three times a day (TID) | ORAL | 0 refills | Status: DC

## 2017-05-09 MED ORDER — CETIRIZINE HCL 10 MG PO TABS: 10.0000 mg | ORAL_TABLET | Freq: Every day | ORAL | 0 refills | Status: DC

## 2017-05-09 MED ORDER — ERYTHROMYCIN 5 MG/GM OP OINT: TOPICAL_OINTMENT | OPHTHALMIC | 0 refills | Status: DC

## 2017-05-09 NOTE — Discharge Instructions (Addendum)
Use erythromycin eye drops 4-5 times daily and come back to the clinic if your eye redness and eye pain is not better in 2 days. Take Zyrtec through the end of April to address allergies. Take amoxicillin for your sinus infection.

## 2017-05-09 NOTE — ED Triage Notes (Signed)
Pt c/o bad allergies, eye pain and drainage.

## 2017-05-09 NOTE — ED Provider Notes (Signed)
  MRN: [id number] DOB: [birth year]  Subjective:   Morgan Mathews is a 27 y.o. female presenting for your day history of bilateral eye redness, sinus congestion, sinus pain, eye itching, eye pain.  Patient has not tried any medications due to her pregnancy, she is afraid to take medicines.  Denies history of allergies.  The only medication she is taking right now is prenatal vitamin.   No Known Allergies  Past Medical History:  Diagnosis Date  . Chronic bilateral thoracic back pain 08/28/2016  . 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 NO PAST SURGERIES      Objective:   Vitals: BP 104/71   Pulse 83   Temp 98.1 F (36.7 C)   Resp 16   LMP 02/06/2017 (Exact Date)   SpO2 98%   Physical Exam  Constitutional: She is oriented to person, place, and time. She appears well-developed and well-nourished.  HENT:  Nose: Right sinus exhibits maxillary sinus tenderness. Left sinus exhibits maxillary sinus tenderness.  Mouth/Throat: Oropharynx is clear and moist.  Eyes: Pupils are equal, round, and reactive to light. EOM are normal. Right eye exhibits discharge (slight, clear). Left eye exhibits discharge (slight, clear). Right conjunctiva is injected. Left conjunctiva is injected.  Cardiovascular: Normal rate.  Pulmonary/Chest: Effort normal.  Neurological: She is alert and oriented to person, place, and time.   Assessment and Plan :   Acute non-recurrent maxillary sinusitis  Acute conjunctivitis of both eyes, unspecified acute conjunctivitis type  Generalized headaches  Have patient start amoxicillin for sinusitis, erythromycin ointment for bilateral conjunctivitis.  Recommend supportive care otherwise including Zyrtec to manage allergies.  Patient is to return to clinic in 2 days if she has no improvement for her conjunctivitis.   Wallis BambergMani, Joeli Fenner, [place]PA-C 05/09/17 2031

## 2017-05-13 ENCOUNTER — Ambulatory Visit (HOSPITAL_COMMUNITY)
Admission: EM | Admit: 2017-05-13 | Discharge: 2017-05-13 | Disposition: A | Discharge status: Home / Self Care | Payer: Medicaid Other | Attending: Family Medicine | Admitting: Family Medicine

## 2017-05-13 ENCOUNTER — Encounter (HOSPITAL_COMMUNITY): Payer: Self-pay | Admitting: Emergency Medicine

## 2017-05-13 DIAGNOSIS — H1013 Acute atopic conjunctivitis, bilateral: Secondary | ICD-10-CM | POA: Diagnosis not present

## 2017-05-13 MED ORDER — OLOPATADINE HCL 0.2 % OP SOLN: 1.0000 [drp] | Freq: Every day | OPHTHALMIC | 0 refills | Status: DC

## 2017-05-13 NOTE — ED Triage Notes (Signed)
Pt here for eye redness; pt seen for same

## 2017-05-13 NOTE — Discharge Instructions (Signed)
Start pataday as directed for allergic symptoms of the eye. Lid scrubs and warm compresses as directed. Monitor for any worsening of symptoms, changes in vision, sensitivity to light, eye swelling, painful eye movement, follow up with ophthalmology for further evaluation.

## 2017-05-13 NOTE — ED Provider Notes (Signed)
[facility]    CSN: [id number] Arrival date & time: 05/13/17  1708     History   Chief Complaint Chief Complaint  Patient presents with  . Conjunctivitis    HPI Morgan Mathews is a 27 y.o. female.   27 year old female who is [redacted] weeks pregnant comes in for continued eye redness and drainage.  She was seen here 4 days ago, and was treated for sinusitis and bacterial conjunctivitis.  She was started on amoxicillin, Zyrtec, erythromycin ointment.  States has finished a course of erythromycin, still continues to have eye redness.  She endorses eye itching, clear drainage.  She does have crusting in the morning.  Denies vision changes.  Has had mild photophobia.  Denies glasses use, contact lens use.  Continues to have some sneezing and rhinorrhea, though much improved after Zyrtec.  Denies fever, chills, night sweats.     Past Medical History:  Diagnosis Date  . Chronic bilateral thoracic back pain 08/28/2016  . 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 Supervision of other normal pregnancy, antepartum 04/21/2017  . Hyperemesis affecting pregnancy, antepartum 08/21/2015    Past Surgical History:  Procedure Laterality Date  . NO PAST SURGERIES      OB History    Gravida  3   Para  2   Term  2   Preterm  0   AB  0   Living  2     SAB  0   TAB  0   Ectopic  0   Multiple  0   Live Births  2            Home Medications    Prior to Admission medications   Medication Sig Start Date End Date Taking? Authorizing Provider  amoxicillin (AMOXIL) 500 MG capsule Take 1 capsule (500 mg total) by mouth 3 (three) times daily. 05/09/17   Wallis BambergMani, Mario, PA-C  cetirizine (ZYRTEC ALLERGY) 10 MG tablet Take 1 tablet (10 mg total) by mouth daily. 05/09/17   Wallis BambergMani, Mario, PA-C  erythromycin ophthalmic ointment Place a 1/2 inch ribbon of ointment into the lower eyelid. 05/09/17   Wallis BambergMani, Mario, PA-C  famotidine  (PEPCID) 20 MG tablet 2 tabs by mouth at bedtime daily 08/28/16 08/28/16  Julieanne MansonMulberry, Elizabeth, MD  Olopatadine HCl 0.2 % SOLN Apply 1 drop to eye daily. 05/13/17   Belinda FisherYu, Imunique Samad V, PA-C  Prenatal Vit-Fe Fumarate-FA (PRENATAL COMPLETE) 14-0.4 MG TABS Take 1 tablet by mouth daily. 04/01/17   Elpidio AnisUpstill, Shari, PA-C  promethazine (PHENERGAN) 25 MG tablet Take 1 tablet (25 mg total) by mouth every 6 (six) hours as needed for nausea or vomiting. Can take 1/2 tablet for mild symptoms Patient not taking: Reported on 04/21/2017 04/07/17   Currie ParisBurleson, Terri L, NP  scopolamine (TRANSDERM-SCOP) 1 MG/3DAYS Place 1 patch (1.5 mg total) onto the skin every 3 (three) days. Use behind the ear. Patient not taking: Reported on 04/21/2017 04/07/17   Currie ParisBurleson, Terri L, NP    Family History History reviewed. No pertinent family history.  Social History Social History   Tobacco Use  . Smoking status: Never Smoker  . Smokeless tobacco: Never Used  Substance Use Topics  . Alcohol use: No  . Drug use: No     Allergies   Patient has no known allergies.   Review of Systems Review of Systems  Reason unable to perform ROS: See HPI as above.  Physical Exam Triage Vital Signs ED Triage Vitals [05/13/17 1719]  Enc Vitals Group     BP 127/76     Pulse Rate 96     Resp 18     Temp 98.9 F (37.2 C)     Temp Source Oral     SpO2 99 %     Weight      Height      Head Circumference      Peak Flow      Pain Score      Pain Loc      Pain Edu?      Excl. in GC?    No data found.  Updated Vital Signs BP 127/76 (BP Location: Right Arm)   Pulse 96   Temp 98.9 F (37.2 C) (Oral)   Resp 18   LMP 02/06/2017 (Exact Date)   SpO2 99%   Visual Acuity Right Eye Distance: 20/30 Left Eye Distance: 20/30 Bilateral Distance: 20/30  Right Eye Near:   Left Eye Near:    Bilateral Near:     Physical Exam  Constitutional: She is oriented to person, place, and time. She appears well-developed and well-nourished. No  distress.  HENT:  Head: Normocephalic and atraumatic.  Eyes: Pupils are equal, round, and reactive to light. EOM and lids are normal.  Bilateral conjunctival injection at the temporal and nasal aspect of the eye.  No photophobia on exam.  No ciliary injection.  Neurological: She is alert and oriented to person, place, and time.     UC Treatments / Results  Labs (all labs ordered are listed, but only abnormal results are displayed) Labs Reviewed - No data to display  EKG None Radiology No results found.  Procedures Procedures (including critical care time)  Medications Ordered in UC Medications - No data to display   Initial Impression / Assessment and Plan / UC Course  I have reviewed the triage vital signs and the nursing notes.  Pertinent labs & imaging results that were available during my care of the patient were reviewed by me and considered in my medical decision making (see chart for details).    Given no improvement with erythromycin ointment, eye itching without pain, clear drainage, will treat for allergic conjunctivitis.  Start Pataday as directed.  Continue Zyrtec as directed.  Lid scrubs and warm compresses as discussed.  Return precautions given.  Patient expresses understanding and agrees to plan.  Final Clinical Impressions(s) / UC Diagnoses   Final diagnoses:  Allergic conjunctivitis of both eyes    ED Discharge Orders        Ordered    Olopatadine HCl 0.2 % SOLN  Daily     05/13/17 1746        Belinda Fisher, PA-C 05/13/17 1805

## 2017-05-19 ENCOUNTER — Encounter: Payer: Self-pay | Admitting: Advanced Practice Midwife

## 2017-05-19 ENCOUNTER — Ambulatory Visit (INDEPENDENT_AMBULATORY_CARE_PROVIDER_SITE_OTHER): Payer: Medicaid Other | Admitting: Advanced Practice Midwife

## 2017-05-19 VITALS — BP 109/61 | HR 89 | Wt 239.1 lb

## 2017-05-19 DIAGNOSIS — Z3480 Encounter for supervision of other normal pregnancy, unspecified trimester: Secondary | ICD-10-CM

## 2017-05-19 MED ORDER — PRENATAL COMPLETE 14-0.4 MG PO TABS
1.0000 | ORAL_TABLET | Freq: Every day | ORAL | 11 refills | Status: DC
Start: 2017-05-19 — End: 2018-01-01

## 2017-05-19 NOTE — Progress Notes (Signed)
Anatomy US scheduled May 20th @ 0800.  Pt notfied.

## 2017-05-19 NOTE — Progress Notes (Signed)
   PRENATAL VISIT NOTE  Subjective:  Morgan Mathews is a 27 y.o. G3P2002 at 133w6d being seen today for ongoing prenatal care.  She is currently monitored for the following issues for this low-risk pregnancy and has Hyperemesis affecting pregnancy, antepartum and Supervision of other normal pregnancy, antepartum on their problem list.  Patient reports no complaints.  Contractions: Not present. Vag. Bleeding: None.   . Denies leaking of fluid.   The following portions of the patient's history were reviewed and updated as appropriate: allergies, current medications, past family history, past medical history, past social history, past surgical history and problem list. Problem list updated.  Objective:   Vitals:   05/19/17 1337  BP: 109/61  Pulse: 89  Weight: 239 lb 1.6 oz (108.5 kg)    Fetal Status: Fetal Heart Rate (bpm): 152 Fundal Height: 14 cm       General:  Alert, oriented and cooperative. Patient is in no acute distress.  Skin: Skin is warm and dry. No rash noted.   Cardiovascular: Normal heart rate noted  Respiratory: Normal respiratory effort, no problems with respiration noted  Abdomen: Soft, gravid, appropriate for gestational age.  Pain/Pressure: Absent     Pelvic: Cervical exam deferred        Extremities: Normal range of motion.  Edema: Trace  Mental Status: Normal mood and affect. Normal behavior. Normal judgment and thought content.   Assessment and Plan:  Pregnancy: G3P2002 at 5633w6d  1. Supervision of other normal pregnancy, antepartum  - US MFM OB DETAIL + 14 WK; Future   Preterm labor symptoms and general obstetric precautions including but not limited to vaginal bleeding, contractions, leaking of fluid and fetal movement were reviewed in detail with the patient. Please refer to After Visit Summary for other counseling recommendations.  Return in about 1 month (around 06/16/2017).  Future Appointments  Date Time Provider Department Center  06/16/2017  8:00  AM [facility] US 3 [facility]    Thressa ShellerHeather Hogan, CNM

## 2017-05-19 NOTE — Patient Instructions (Addendum)
 Contraception Choices Contraception, also called birth control, refers to methods or devices that prevent pregnancy. Hormonal methods Contraceptive implant A contraceptive implant is a thin, plastic tube that contains a hormone. It is inserted into the upper part of the arm. It can remain in place for up to 3 years. Progestin-only injections Progestin-only injections are injections of progestin, a synthetic form of the hormone progesterone. They are given every 3 months by a health care provider. Birth control pills Birth control pills are pills that contain hormones that prevent pregnancy. They must be taken once a day, preferably at the same time each day. Birth control patch The birth control patch contains hormones that prevent pregnancy. It is placed on the skin and must be changed once a week for three weeks and removed on the fourth week. A prescription is needed to use this method of contraception. Vaginal ring A vaginal ring contains hormones that prevent pregnancy. It is placed in the vagina for three weeks and removed on the fourth week. After that, the process is repeated with a new ring. A prescription is needed to use this method of contraception. Emergency contraceptive Emergency contraceptives prevent pregnancy after unprotected sex. They come in pill form and can be taken up to 5 days after sex. They work best the sooner they are taken after having sex. Most emergency contraceptives are available without a prescription. This method should not be used as your only form of birth control. Barrier methods Female condom A female condom is a thin sheath that is worn over the penis during sex. Condoms keep sperm from going inside a woman's body. They can be used with a spermicide to increase their effectiveness. They should be disposed after a single use. Female condom A female condom is a soft, loose-fitting sheath that is put into the vagina before sex. The condom keeps sperm from  going inside a woman's body. They should be disposed after a single use. Diaphragm A diaphragm is a soft, dome-shaped barrier. It is inserted into the vagina before sex, along with a spermicide. The diaphragm blocks sperm from entering the uterus, and the spermicide kills sperm. A diaphragm should be left in the vagina for 6-8 hours after sex and removed within 24 hours. A diaphragm is prescribed and fitted by a health care provider. A diaphragm should be replaced every 1-2 years, after giving birth, after gaining more than 15 lb (6.8 kg), and after pelvic surgery. Cervical cap A cervical cap is a round, soft latex or plastic cup that fits over the cervix. It is inserted into the vagina before sex, along with spermicide. It blocks sperm from entering the uterus. The cap should be left in place for 6-8 hours after sex and removed within 48 hours. A cervical cap must be prescribed and fitted by a health care provider. It should be replaced every 2 years. Sponge A sponge is a soft, circular piece of polyurethane foam with spermicide on it. The sponge helps block sperm from entering the uterus, and the spermicide kills sperm. To use it, you make it wet and then insert it into the vagina. It should be inserted before sex, left in for at least 6 hours after sex, and removed and thrown away within 30 hours. Spermicides Spermicides are chemicals that kill or block sperm from entering the cervix and uterus. They can come as a cream, jelly, suppository, foam, or tablet. A spermicide should be inserted into the vagina with an applicator at least 10-15 minutes   sex to allow time for it to work. The process must be repeated every time you have sex. Spermicides do not require a prescription. Intrauterine contraception Intrauterine device (IUD) An IUD is a T-shaped device that is put in a woman's uterus. There are two types:  Hormone IUD.This type contains progestin, a synthetic form of the hormone progesterone. This  type can stay in place for 3-5 years.  Copper IUD.This type is wrapped in copper wire. It can stay in place for 10 years.  Permanent methods of contraception Female tubal ligation In this method, a woman's fallopian tubes are sealed, tied, or blocked during surgery to prevent eggs from traveling to the uterus. Hysteroscopic sterilization In this method, a small, flexible insert is placed into each fallopian tube. The inserts cause scar tissue to form in the fallopian tubes and block them, so sperm cannot reach an egg. The procedure takes about 3 months to be effective. Another form of birth control must be used during those 3 months. Female sterilization This is a procedure to tie off the tubes that carry sperm (vasectomy). After the procedure, the man can still ejaculate fluid (semen). Natural planning methods Natural family planning In this method, a couple does not have sex on days when the woman could become pregnant. Calendar method This means keeping track of the length of each menstrual cycle, identifying the days when pregnancy can happen, and not having sex on those days. Ovulation method In this method, a couple avoids sex during ovulation. Symptothermal method This method involves not having sex during ovulation. The woman typically checks for ovulation by watching changes in her temperature and in the consistency of cervical mucus. Post-ovulation method In this method, a couple waits to have sex until after ovulation. Summary  Contraception, also called birth control, means methods or devices that prevent pregnancy.  Hormonal methods of contraception include implants, injections, pills, patches, vaginal rings, and emergency contraceptives.  Barrier methods of contraception can include female condoms, female condoms, diaphragms, cervical caps, sponges, and spermicides.  There are two types of IUDs (intrauterine devices). An IUD can be put in a woman's uterus to prevent pregnancy  for 3-5 years.  Permanent sterilization can be done through a procedure for males, females, or both.  Natural family planning methods involve not having sex on days when the woman could become pregnant. This information is not intended to replace advice given to you by your health care provider. Make sure you discuss any questions you have with your health care provider. Document Released: 01/14/2005 Document Revised: 02/17/2016 Document Reviewed: 02/17/2016 Elsevier Interactive Patient Education  2018 [place] Education Options: [facility] Department Classes:  Childbirth education classes can help you get ready for a positive parenting experience. You can also meet other expectant parents and get free stuff for your baby. Each class runs for five weeks on the same night and costs $45 for the mother-to-be and her support person. Medicaid covers the cost if you are eligible. Call [phone] to register. [facility] Childbirth Education:  [phone] or [phone] or [email or web address]  Baby & Me Class: Discuss newborn & infant parenting and family adjustment issues with other new mothers in a relaxed environment. Each week brings a new speaker or baby-centered activity. We encourage new mothers to join [place] every Thursday at 11:00am. Babies birth until crawling. No registration or fee. Daddy [employer]: This course offers Dads-to-be the tools and knowledge needed to feel confident on their journey to becoming new fathers.  Experienced dads, who have been trained as coaches, teach dads-to-be how to hold, comfort, diaper, swaddle and play with their infant while being able to support the new mom as well. A class for men taught by men. $25/dad Big Brother/Big Sister: Let your children share in the joy of a new brother or sister in this special class designed just for them. Class includes discussion about how families care for babies: swaddling, holding,  diapering, safety as well as how they can be helpful in their new role. This class is designed for children ages 57 to 104, but any age is welcome. Please register each child individually. $5/child  Mom Talk: This mom-led group offers support and connection to mothers as they journey through the adjustments and struggles of that sometimes overwhelming first year after the birth of a child. Tuesdays at 10:00am and Thursdays at 6:00pm. Babies welcome. No registration or fee. Breastfeeding Support Group: This group is a mother-to-mother support circle where moms have the opportunity to share their breastfeeding experiences. A Lactation Consultant is present for questions and concerns. Meets each Tuesday at 11:00am. No fee or registration. Breastfeeding Your Baby: Learn what to expect in the first days of breastfeeding your newborn.  This class will help you feel more confident with the skills needed to begin your breastfeeding experience. Many new mothers are concerned about breastfeeding after leaving the hospital. This class will also address the most common fears and challenges about breastfeeding during the first few weeks, months and beyond. (call for fee) Comfort Techniques and Tour: This 2 hour interactive class will provide you the opportunity to learn & practice hands-on techniques that can help relieve some of the discomfort of labor and encourage your baby to rotate toward the best position for birth. You and your partner will be able to try a variety of labor positions with birth balls and rebozos as well as practice breathing, relaxation, and visualization techniques. A tour of the [facility] is included with this class. $20 per registrant and support person Childbirth Class- Weekend Option: This class is a Weekend version of our Birth & Baby series. It is designed for parents who have a difficult time fitting several weeks of classes into their schedule. It covers the care of  your newborn and the basics of labor and childbirth. It also includes a [place] [facility] and lunch. The class is held two consecutive days: beginning on Friday evening from 6:30 - 8:30 p.m. and the next day, Saturday from 9 a.m. - 4 p.m. (call for fee) Doren Custard Class: Interested in a waterbirth?  This informational class will help you discover whether waterbirth is the right fit for you. Education about waterbirth itself, supplies you would need and how to assemble your support team is what you can expect from this class. Some obstetrical practices require this class in order to pursue a waterbirth. (Not all obstetrical practices offer waterbirth-check with your healthcare provider.) Register only the expectant mom, but you are encouraged to bring your partner to class! Required if planning waterbirth, no fee. Infant/Child CPR: Parents, grandparents, babysitters, and friends learn Cardio-Pulmonary Resuscitation skills for infants and children. You will also learn how to treat both conscious and unconscious choking in infants and children. This Family & Friends program does not offer certification. Register each participant individually to ensure that enough mannequins are available. (Call for fee) Grandparent Love: Expecting a grandbaby? This class is for you! Learn about the latest infant  care and safety recommendations and ways to support your own child as he or she transitions into the parenting role. Taught by Registered Nurses who are childbirth instructors, but most importantly...they are grandmothers too! $10/person. Childbirth Class- Natural Childbirth: This series of 5 weekly classes is for expectant parents who want to learn and practice natural methods of coping with the process of labor and childbirth. Relaxation, breathing, massage, visualization, role of the partner, and helpful positioning are highlighted. Participants learn how to be confident in their body's  ability to give birth. This class will empower and help parents make informed decisions about their own care. Includes discussion that will help new parents transition into the immediate postpartum period. [facility] is included. We suggest taking this class between 25-32 weeks, but it's only a recommendation. $75 per registrant and one support person or $30 Medicaid. Childbirth Class- 3 week Series: This option of 3 weekly classes helps you and your labor partner prepare for childbirth. Newborn care, labor & birth, cesarean birth, pain management, and comfort techniques are discussed and a [place] [facility] is included. The class meets at the same time, on the same day of the week for 3 consecutive weeks beginning with the starting date you choose. $60 for registrant and one support person.  Marvelous Multiples: Expecting twins, triplets, or more? This class covers the differences in labor, birth, parenting, and breastfeeding issues that face multiples' parents. NICU tour is included. Led by a Certified Childbirth Educator who is the mother of twins. No fee. Caring for Baby: This class is for expectant and adoptive parents who want to learn and practice the most up-to-date newborn care for their babies. Focus is on birth through the first six weeks of life. Topics include feeding, bathing, diapering, crying, umbilical cord care, circumcision care and safe sleep. Parents learn to recognize symptoms of illness and when to call the pediatrician. Register only the mom-to-be and your partner or support person can plan to come with you! $10 per registrant and support person Childbirth Class- online option: This online class offers you the freedom to complete a Birth and Baby series in the comfort of your own home. The flexibility of this option allows you to review sections at your own pace, at times convenient to you and your support people. It  includes additional video information, animations, quizzes, and extended activities. Get organized with helpful eClass tools, checklists, and trackers. Once you register online for the class, you will receive an email within a few days to accept the invitation and begin the class when the time is right for you. The content will be available to you for 60 days. $60 for 60 days of online access for you and your support people.  Local Doulas: Natural Baby Doulas [email or web address] Tel: [phone] [email or web address] [employer] [phone] [email or web address] The Labor Hassell Halim  (also do waterbirth tub rental) [phone] [email or web address] [facility] Birth Doula [phone] [email or web address] NotebookDistributors.fi Sacred Rhythms  [phone] https://[email or web address]-[email or web address]/ [employer] (PADA) [email or web address] [place] Birth [facility] Baby  [email or web address] Considering Waterbirth? Guide for patients at [facility] [employer]  Why consider waterbirth?  . Gentle birth for babies . Less pain medicine used in labor . May allow for passive descent/less pushing . May reduce perineal tears  . More mobility and instinctive maternal position changes . Increased maternal relaxation . Reduced blood pressure in labor  Is waterbirth safe? What are the risks of infection, drowning or  other complications?  . Infection: o Very low risk (3.7 % for tub vs 4.8% for bed) o 7 in 8000 waterbirths with documented infection o Poorly cleaned equipment most common cause o Slightly lower group B strep transmission rate  . Drowning o Maternal:  - Very low risk   - Related to seizures or fainting o Newborn:  - Very low risk. No evidence of increased risk of respiratory problems in multiple large  studies - Physiological protection from breathing under water - Avoid underwater birth if there are any fetal complications - Once baby's head is out of the water, keep it out.  . Birth complication o Some reports of cord trauma, but risk decreased by bringing baby to surface gradually o No evidence of increased risk of shoulder dystocia. Mothers can usually change positions faster in water than in a bed, possibly aiding the maneuvers to free the shoulder.   You must attend a Doren Custard class at [facility]  3rd Wednesday of every month from 7-9pm  [employer] by calling [phone] or online at [email or web address]  Bring [place] the certificate from the class to your prenatal appointment  Meet with a midwife at 36 weeks to see if you can still plan a waterbirth and to sign the consent.   Purchase or rent the following supplies:   Water Birth Pool (Birth Pool in a Box or [place] for instance)  (Tubs start ~$125)  Single-use disposable tub liner designed for your brand of tub  New garden hose labeled "lead-free", "suitable for drinking water",  Electric drain pump to remove water (We recommend 792 gallon per hour or greater pump.)   Separate garden hose to remove the dirty water  Fish net  Bathing suit top (optional)  Long-handled mirror (optional)  Places to purchase or rent supplies  GotWebTools.is for tub purchases and supplies  Waterbirthsolutions.com for tub purchases and supplies  The Labor Ladies (www.[email or web address]) $275 for tub rental/set-up & take down/kit   [employer] (http://www.fleming.com/.htm) Information regarding doulas (labor support) who provide pool rentals  Our practice has a Birth Pool in a Box tub at the hospital that you may borrow on a first-come-first-served basis. It is your responsibility to to set up, clean and break down the tub. We cannot guarantee the availability of this tub in advance. You  are responsible for bringing all accessories listed above. If you do not have all necessary supplies you cannot have a waterbirth.    Things that would prevent you from having a waterbirth:  Premature, <37wks  Previous cesarean birth  Presence of thick meconium-stained fluid  Multiple gestation (Twins, triplets, etc.)  Uncontrolled diabetes or gestational diabetes requiring medication  Hypertension requiring medication or diagnosis of pre-eclampsia  Heavy vaginal bleeding  Non-reassuring fetal heart rate  Active infection (MRSA, etc.). Group B Strep is NOT a contraindication for  waterbirth.  If your labor has to be induced and induction method requires continuous  monitoring of the baby's heart rate  Other risks/issues identified by your obstetrical provider  Please remember that birth is unpredictable. Under certain unforeseeable circumstances your provider may advise against giving birth in the tub. These decisions will be made on a case-by-case basis and with the safety of you and your baby as our highest priority.

## 2017-06-16 ENCOUNTER — Ambulatory Visit (INDEPENDENT_AMBULATORY_CARE_PROVIDER_SITE_OTHER): Payer: Medicaid Other | Admitting: Advanced Practice Midwife

## 2017-06-16 ENCOUNTER — Encounter: Payer: Self-pay | Admitting: *Deleted

## 2017-06-16 ENCOUNTER — Encounter (HOSPITAL_COMMUNITY): Payer: Self-pay

## 2017-06-16 ENCOUNTER — Ambulatory Visit (HOSPITAL_COMMUNITY)
Admission: RE | Admit: 2017-06-16 | Discharge: 2017-06-16 | Disposition: A | Discharge status: Home / Self Care | Payer: Medicaid Other | Source: Ambulatory Visit | Attending: Advanced Practice Midwife | Admitting: Advanced Practice Midwife

## 2017-06-16 VITALS — BP 124/51 | HR 83 | Wt 241.8 lb

## 2017-06-16 DIAGNOSIS — Z3482 Encounter for supervision of other normal pregnancy, second trimester: Secondary | ICD-10-CM

## 2017-06-16 DIAGNOSIS — Z3480 Encounter for supervision of other normal pregnancy, unspecified trimester: Secondary | ICD-10-CM

## 2017-06-16 DIAGNOSIS — Z363 Encounter for antenatal screening for malformations: Secondary | ICD-10-CM | POA: Insufficient documentation

## 2017-06-16 DIAGNOSIS — Z3A19 19 weeks gestation of pregnancy: Secondary | ICD-10-CM | POA: Diagnosis not present

## 2017-06-16 DIAGNOSIS — O99212 Obesity complicating pregnancy, second trimester: Secondary | ICD-10-CM | POA: Diagnosis not present

## 2017-06-16 DIAGNOSIS — Z3689 Encounter for other specified antenatal screening: Secondary | ICD-10-CM | POA: Insufficient documentation

## 2017-06-16 MED ORDER — PRENATAL 27-1 MG PO TABS: 1.0000 | ORAL_TABLET | Freq: Every day | ORAL | 11 refills | Status: DC

## 2017-06-16 NOTE — Progress Notes (Signed)
C/o intermittent edema and pain in left foot

## 2017-06-16 NOTE — Progress Notes (Signed)
   PRENATAL VISIT NOTE  Subjective:  Morgan Mathews is a 27 y.o. G3P2002 at [redacted]w[redacted]d being seen today for ongoing prenatal care.  She is currently monitored for the following issues for this low-risk pregnancy and has Hyperemesis affecting pregnancy, antepartum; Supervision of other normal pregnancy, antepartum; Encounter for fetal anatomic survey; Obesity affecting pregnancy in second trimester; and [redacted] weeks gestation of pregnancy on their problem list.  Patient reports soreness on top of left foot. States it is relieved with elevation and a "rub" that she uses for muscle soreness. Denies falls..  Contractions: Not present. Vag. Bleeding: None.  Movement: Absent. Denies leaking of fluid.   The following portions of the patient's history were reviewed and updated as appropriate: allergies, current medications, past family history, past medical history, past social history, past surgical history and problem list. Problem list updated.  Objective:   Vitals:   06/16/17 1035  BP: (!) 124/51  Pulse: 83  Weight: 241 lb 12.8 oz (109.7 kg)    Fetal Status:     Movement: Absent     General:  Alert, oriented and cooperative. Patient is in no acute distress.  Skin: Skin is warm and dry. No rash noted.   Cardiovascular: Normal heart rate noted  Respiratory: Normal respiratory effort, no problems with respiration noted  Abdomen: Soft, gravid, appropriate for gestational age.  Pain/Pressure: Absent     Pelvic: Cervical exam deferred        Extremities: Normal range of motion.  Edema: Trace  Mental Status: Normal mood and affect. Normal behavior. Normal judgment and thought content.   Assessment and Plan:  Pregnancy: G3P2002 at [redacted]w[redacted]d  1. Supervision of other normal pregnancy, antepartum - Flu Vaccine QUAD 36+ mos IM - [place] MFM OB FOLLOW UP; Future - Reviewed Panorama results and discussed screening vs. Diagnostic results  Preterm labor symptoms and general obstetric precautions including but not  limited to vaginal bleeding, contractions, leaking of fluid and fetal movement were reviewed in detail with the patient. Please refer to After Visit Summary for other counseling recommendations.  No follow-ups on file.  Future Appointments  Date Time Provider Department Center  07/28/2017  8:00 AM WH-[facility] [place] 3 WH-MFCUS [facility]-US   Clayton Bibles, [place] 06/16/17 1100

## 2017-06-17 ENCOUNTER — Other Ambulatory Visit (HOSPITAL_COMMUNITY): Payer: Self-pay | Admitting: *Deleted

## 2017-06-17 DIAGNOSIS — Z362 Encounter for other antenatal screening follow-up: Secondary | ICD-10-CM

## 2017-07-14 ENCOUNTER — Encounter: Payer: Medicaid Other | Admitting: Advanced Practice Midwife

## 2017-07-28 ENCOUNTER — Encounter: Payer: Self-pay | Admitting: Advanced Practice Midwife

## 2017-07-28 ENCOUNTER — Ambulatory Visit (INDEPENDENT_AMBULATORY_CARE_PROVIDER_SITE_OTHER): Payer: Medicaid Other | Admitting: Advanced Practice Midwife

## 2017-07-28 ENCOUNTER — Ambulatory Visit (HOSPITAL_COMMUNITY)
Admission: RE | Admit: 2017-07-28 | Discharge: 2017-07-28 | Disposition: A | Discharge status: Home / Self Care | Payer: Medicaid Other | Source: Ambulatory Visit | Attending: Advanced Practice Midwife | Admitting: Advanced Practice Midwife

## 2017-07-28 ENCOUNTER — Encounter (HOSPITAL_COMMUNITY): Payer: Self-pay

## 2017-07-28 ENCOUNTER — Other Ambulatory Visit (HOSPITAL_COMMUNITY): Payer: Self-pay | Admitting: Obstetrics and Gynecology

## 2017-07-28 ENCOUNTER — Other Ambulatory Visit (HOSPITAL_COMMUNITY): Payer: Self-pay | Admitting: *Deleted

## 2017-07-28 VITALS — BP 96/62 | HR 82 | Wt 253.5 lb

## 2017-07-28 DIAGNOSIS — Z3480 Encounter for supervision of other normal pregnancy, unspecified trimester: Secondary | ICD-10-CM

## 2017-07-28 DIAGNOSIS — Z362 Encounter for other antenatal screening follow-up: Secondary | ICD-10-CM | POA: Insufficient documentation

## 2017-07-28 DIAGNOSIS — O99210 Obesity complicating pregnancy, unspecified trimester: Secondary | ICD-10-CM

## 2017-07-28 DIAGNOSIS — Z3A25 25 weeks gestation of pregnancy: Secondary | ICD-10-CM | POA: Insufficient documentation

## 2017-07-28 DIAGNOSIS — O99213 Obesity complicating pregnancy, third trimester: Secondary | ICD-10-CM

## 2017-07-28 DIAGNOSIS — O99212 Obesity complicating pregnancy, second trimester: Secondary | ICD-10-CM | POA: Diagnosis not present

## 2017-07-28 DIAGNOSIS — E669 Obesity, unspecified: Secondary | ICD-10-CM | POA: Diagnosis not present

## 2017-07-28 MED ORDER — NATACHEW 28-1 MG PO CHEW
1.0000 | CHEWABLE_TABLET | Freq: Every day | ORAL | 11 refills | Status: DC
Start: 2017-07-28 — End: 2017-10-23

## 2017-07-28 NOTE — Patient Instructions (Signed)

## 2017-07-28 NOTE — Progress Notes (Signed)
   PRENATAL VISIT NOTE  Subjective:  Morgan Mathews is a 27 y.o. G3P2002 at 1012w4d being seen today for ongoing prenatal care.  She is currently monitored for the following issues for this low-risk pregnancy and has Hyperemesis affecting pregnancy, antepartum; Supervision of other normal pregnancy, antepartum; Encounter for fetal anatomic survey; Obesity affecting pregnancy in second trimester; and [redacted] weeks gestation of pregnancy on their problem list.  Patient reports no complaints.  Contractions: Not present. Vag. Bleeding: None.  Movement: Present. Denies leaking of fluid.   The following portions of the patient's history were reviewed and updated as appropriate: allergies, current medications, past family history, past medical history, past social history, past surgical history and problem list. Problem list updated.  Objective:   Vitals:   07/28/17 1045  BP: 96/62  Pulse: 82  Weight: 253 lb 8 oz (115 kg)    Fetal Status: Fetal Heart Rate (bpm): 146 Fundal Height: 26 cm Movement: Present     General:  Alert, oriented and cooperative. Patient is in no acute distress.  Skin: Skin is warm and dry. No rash noted.   Cardiovascular: Normal heart rate noted  Respiratory: Normal respiratory effort, no problems with respiration noted  Abdomen: Soft, gravid, appropriate for gestational age.  Pain/Pressure: Absent     Pelvic: Cervical exam deferred        Extremities: Normal range of motion.  Edema: None  Mental Status: Normal mood and affect. Normal behavior. Normal judgment and thought content.   Assessment and Plan:  Pregnancy: G3P2002 at 6712w4d  1. Supervision of other normal pregnancy, antepartum - GTT and 28 week labs at [facility]  - RX natachew prenatal vitamin  2. Obesity affecting pregnancy in second trimester - FU US for growth in 7 weeks   Preterm labor symptoms and general obstetric precautions including but not limited to vaginal bleeding, contractions, leaking of fluid and  fetal movement were reviewed in detail with the patient. Please refer to After Visit Summary for other counseling recommendations.  Return in about 2 weeks (around 08/11/2017) for ob/fu 2 hr gtt/ 28 wk labs.  Future Appointments  Date Time Provider Department Center  09/15/2017  8:15 AM [facility] [place] 4 WH-MFCUS [facility]-US    Thressa ShellerHeather Kjersti Dittmer, CNM

## 2017-08-11 ENCOUNTER — Other Ambulatory Visit: Payer: Self-pay | Admitting: *Deleted

## 2017-08-11 DIAGNOSIS — Z3480 Encounter for supervision of other normal pregnancy, unspecified trimester: Secondary | ICD-10-CM

## 2017-08-11 DIAGNOSIS — O99212 Obesity complicating pregnancy, second trimester: Secondary | ICD-10-CM

## 2017-08-12 ENCOUNTER — Other Ambulatory Visit: Payer: Medicaid Other

## 2017-08-12 DIAGNOSIS — O99212 Obesity complicating pregnancy, second trimester: Secondary | ICD-10-CM

## 2017-08-12 DIAGNOSIS — Z3480 Encounter for supervision of other normal pregnancy, unspecified trimester: Secondary | ICD-10-CM

## 2017-08-13 LAB — CBC
HEMATOCRIT: 32.9 % — AB (ref 34.0–46.6)
HEMOGLOBIN: 11.3 g/dL (ref 11.1–15.9)
MCH: 32.8 pg (ref 26.6–33.0)
MCHC: 34.3 g/dL (ref 31.5–35.7)
MCV: 96 fL (ref 79–97)
Platelets: 211 10*3/uL (ref 150–450)
RBC: 3.44 x10E6/uL — ABNORMAL LOW (ref 3.77–5.28)
RDW: 13.5 % (ref 12.3–15.4)
WBC: 4.8 10*3/uL (ref 3.4–10.8)

## 2017-08-13 LAB — GLUCOSE TOLERANCE, 2 HOURS W/ 1HR
GLUCOSE, 2 HOUR: 103 mg/dL (ref 65–152)
Glucose, 1 hour: 184 mg/dL — ABNORMAL HIGH (ref 65–179)
Glucose, Fasting: 76 mg/dL (ref 65–91)

## 2017-08-13 LAB — RPR: RPR Ser Ql: NONREACTIVE

## 2017-08-13 LAB — HIV ANTIBODY (ROUTINE TESTING W REFLEX): HIV Screen 4th Generation wRfx: NONREACTIVE

## 2017-08-14 ENCOUNTER — Ambulatory Visit (INDEPENDENT_AMBULATORY_CARE_PROVIDER_SITE_OTHER): Payer: Medicaid Other | Admitting: Advanced Practice Midwife

## 2017-08-14 ENCOUNTER — Encounter: Payer: Self-pay | Admitting: Advanced Practice Midwife

## 2017-08-14 VITALS — BP 118/60 | HR 95 | Wt 252.0 lb

## 2017-08-14 DIAGNOSIS — O24419 Gestational diabetes mellitus in pregnancy, unspecified control: Secondary | ICD-10-CM

## 2017-08-14 DIAGNOSIS — Z3480 Encounter for supervision of other normal pregnancy, unspecified trimester: Secondary | ICD-10-CM

## 2017-08-14 DIAGNOSIS — K0889 Other specified disorders of teeth and supporting structures: Secondary | ICD-10-CM

## 2017-08-14 NOTE — Progress Notes (Signed)
   PRENATAL VISIT NOTE  Subjective:  Morgan Mathews is a 27 y.o. G3P2002 at 7653w0d being seen today for ongoing prenatal care.  She is currently monitored for the following issues for this low-risk pregnancy and has Hyperemesis affecting pregnancy, antepartum; Supervision of other normal pregnancy, antepartum; Encounter for fetal anatomic survey; Obesity affecting pregnancy in second trimester; and [redacted] weeks gestation of pregnancy on their problem list.  Patient reports tooth pain since early pregnancy.  Contractions: Not present. Vag. Bleeding: None.  Movement: Present. Denies leaking of fluid.   The following portions of the patient's history were reviewed and updated as appropriate: allergies, current medications, past family history, past medical history, past social history, past surgical history and problem list. Problem list updated.  Objective:   Vitals:   08/14/17 1109  BP: 118/60  Pulse: 95  Weight: 252 lb (114.3 kg)    Fetal Status: Fetal Heart Rate (bpm): 146   Movement: Present     General:  Alert, oriented and cooperative. Patient is in no acute distress.  Skin: Skin is warm and dry. No rash noted.   Cardiovascular: Normal heart rate noted  Respiratory: Normal respiratory effort, no problems with respiration noted  Abdomen: Soft, gravid, appropriate for gestational age.  Pain/Pressure: Absent     Pelvic: Cervical exam deferred        Extremities: Normal range of motion.  Edema: None  Mental Status: Normal mood and affect. Normal behavior. Normal judgment and thought content.   Assessment and Plan:  Pregnancy: G3P2002 at 3553w0d  1. Gestational diabetes mellitus (GDM) in third trimester, gestational diabetes method of control unspecified --New diagnoses with GTT 08/12/17, did not have in previous pregnancies. --Discussed GDM, risks to mom and baby --Schedule with diabetic education --Pt largest baby 9+ lbs vaginal delivery  2. Supervision of other normal pregnancy,  antepartum --Anticipatory guidance about next visits/weeks of pregnancy given.  3. Tooth pain --No acute changes, pain x 3 + months.  Dental letter and contact info for dentist provided.  Preterm labor symptoms and general obstetric precautions including but not limited to vaginal bleeding, contractions, leaking of fluid and fetal movement were reviewed in detail with the patient. Please refer to After Visit Summary for other counseling recommendations.  Return in about 2 weeks (around 08/28/2017).  Future Appointments  Date Time Provider Department Center  09/15/2017  8:15 AM [facility] US 4 [facility]    Sharen CounterLisa Leftwich-Kirby, CNM

## 2017-08-14 NOTE — Patient Instructions (Signed)
Gestational Diabetes Mellitus, Diagnosis  Gestational diabetes (gestational diabetes mellitus) is a temporary form of diabetes that some women develop during pregnancy. It usually occurs around weeks 24-28 of pregnancy and goes away after delivery. Hormonal changes during pregnancy can interfere with insulin production and function, which may result in one or both of these problems:   The pancreas does not make enough of a hormone called insulin.   Cells in the body do not respond properly to insulin that the body makes (insulin resistance).    Normally, insulin allows sugars (glucose) to enter cells in the body. The cells use glucose for energy. Insulin resistance or lack of insulin causes excess glucose to build up in the blood instead of going into cells. As a result, high blood glucose (hyperglycemia) develops.  What are the risks?  If gestational diabetes is treated, it is unlikely to cause problems. If it is not controlled with treatment, it may cause problems during labor and delivery, and some of those problems can be harmful to the unborn baby (fetus) and the mother. Uncontrolled gestational diabetes may also cause the newborn baby to have breathing problems and low blood glucose.  Women who get gestational diabetes are more likely to develop it if they get pregnant again, and they are more likely to develop type 2 diabetes in the future.  What increases the risk?  This condition may be more likely to develop in pregnant women who:   Are older than age 25 during pregnancy.   Have a family history of diabetes.   Are overweight.   Had gestational diabetes in the past.   Have polycystic ovarian syndrome (PCOS).   Are pregnant with twins or multiples.   Are of American-Indian, African-American, Hispanic/Latino, or Asian/Pacific Islander descent.    What are the signs or symptoms?  Most women do not notice symptoms of gestational diabetes because the symptoms are similar to normal symptoms of pregnancy.  Symptoms of gestational diabetes may include:   Increased thirst (polydipsia).   Increased hunger(polyphagia).   Increased urination (polyuria).    How is this diagnosed?    This condition may be diagnosed based on your blood glucose level, which may be checked with one or more of the following blood tests:   A fasting blood glucose (FBG) test. You will not be allowed to eat (you will fast) for at least 8 hours before a blood sample is taken.   A random blood glucose test. This checks your blood glucose at any time of day regardless of when you ate.   An oral glucose tolerance test (OGTT). This is usually done during weeks 24-28 of pregnancy.  ? For this test, you will have an FBG test done. Then, you will drink a beverage that contains glucose. Your blood glucose will be tested again 1 hour after drinking the glucose beverage (1-hour OGTT).  ? If the 1-hour OGTT result is at or above 140 mg/dL (7.8 mmol/L), you will repeat the OGTT. This time, your blood glucose will be tested 3 hours after drinking the glucose beverage (3-hour OGTT).    If you have risk factors, you may be screened for undiagnosed type 2 diabetes at your first health care visit during your pregnancy (prenatal visit).  How is this treated?    Your treatment may be managed by a specialist called an endocrinologist. This condition is treated by following instructions from your health care provider about:   Eating a healthier diet and getting more physical activity.   These changes are the most important ways to manage gestational diabetes.   Checking your blood glucose. Do this as often as told.   Taking diabetes medicines or insulin every day. These will only be prescribed if they are needed.  ? If you use insulin, you may need to adjust your dosage based on how physically active you are and what foods you eat. Your health care provider will tell you how to do this.    Your health care provider will set treatment goals for you based on the  stage of your pregnancy and any other medical conditions you have. Generally, the goal of treatment is to maintain the following blood glucose levels during pregnancy:   Fasting: at or below 95 mg/dL (5.3 mmol/L).   After meals (postprandial):  ? One hour after a meal: at or below 140 mg/dL (7.8 mmol/L).  ? Two hours after a meal: at or below 120 mg/dL (6.7 mmol/L).   A1c (hemoglobin A1c) level: 6-6.5%.    Follow these instructions at home:   Take over-the-counter and prescription medicines only as told by your health care provider.   Manage your weight gain during pregnancy. The amount of weight that you are expected to gain depends on your pre-pregnancy BMI (body mass index).   Keep all follow-up visits as told by your health care provider. This is important.  Consider asking your health care provider these questions:     Do I need to meet with a diabetes educator?   Where can I find a support group for people with diabetes?   What equipment will I need to manage my diabetes at home?   What diabetes medicines do I need, and when should I take them?   How often do I need to check my blood glucose?   What number can I call if I have questions?   When is my next appointment?  Where to find more information:   For more information about diabetes, visit:  ? American Diabetes Association (ADA): www.diabetes.org  ? American Association of Diabetes Educators (AADE): www.diabeteseducator.org/patient-resources  Contact a health care provider if:   Your blood glucose level is at or above 240 mg/dL (13.3 mmol/L).   Your blood glucose level is at or above 200 mg/dL (11.1 mmol/L) and you have ketones in your urine.   You have been sick or have had a fever for 2 days or more and you are not getting better.   You have any of the following problems for more than 6 hours:  ? You cannot eat or drink.  ? You have nausea and vomiting.  ? You have diarrhea.  Get help right away if:   Your blood glucose is below 54  mg/dL (3 mmol/L).   You become confused or you have trouble thinking clearly.   You have difficulty breathing.   You have moderate or large ketone levels in your urine.   Your baby is moving around less than usual.   You develop unusual discharge or bleeding from your vagina.   You start having contractions early (prematurely). Contractions may feel like a tightening in your lower abdomen.  This information is not intended to replace advice given to you by your health care provider. Make sure you discuss any questions you have with your health care provider.  Document Released: 04/22/2000 Document Revised: 06/22/2015 Document Reviewed: 02/17/2015  Elsevier Interactive Patient Education  2018 [employer]vier [employer]

## 2017-09-08 ENCOUNTER — Encounter: Payer: Medicaid Other | Admitting: Advanced Practice Midwife

## 2017-09-09 ENCOUNTER — Other Ambulatory Visit: Payer: Medicaid Other

## 2017-09-09 ENCOUNTER — Encounter: Payer: Medicaid Other | Admitting: Obstetrics & Gynecology

## 2017-09-15 ENCOUNTER — Ambulatory Visit (HOSPITAL_COMMUNITY)
Admission: RE | Admit: 2017-09-15 | Discharge: 2017-09-15 | Disposition: A | Discharge status: Home / Self Care | Payer: Medicaid Other | Source: Ambulatory Visit | Attending: Advanced Practice Midwife | Admitting: Advanced Practice Midwife

## 2017-09-15 ENCOUNTER — Other Ambulatory Visit (HOSPITAL_COMMUNITY): Payer: Self-pay | Admitting: *Deleted

## 2017-09-15 ENCOUNTER — Encounter (HOSPITAL_COMMUNITY): Payer: Self-pay

## 2017-09-15 ENCOUNTER — Telehealth: Payer: Self-pay

## 2017-09-15 DIAGNOSIS — Z3A32 32 weeks gestation of pregnancy: Secondary | ICD-10-CM | POA: Diagnosis not present

## 2017-09-15 DIAGNOSIS — O24410 Gestational diabetes mellitus in pregnancy, diet controlled: Secondary | ICD-10-CM | POA: Diagnosis not present

## 2017-09-15 DIAGNOSIS — Z362 Encounter for other antenatal screening follow-up: Secondary | ICD-10-CM | POA: Diagnosis not present

## 2017-09-15 DIAGNOSIS — O99213 Obesity complicating pregnancy, third trimester: Secondary | ICD-10-CM | POA: Insufficient documentation

## 2017-09-15 HISTORY — DX: Gestational diabetes mellitus in pregnancy, unspecified control: O24.419

## 2017-09-15 NOTE — Telephone Encounter (Signed)
No call made for this client

## 2017-09-15 NOTE — Congregational Nurse Program (Signed)
Client visit  to talk about CN interview. She is pregnant and wanted her blood sugar( Gestational Diabetes) checked. I checked her CBG and she was 156. I was abe to do some diabetic teaching and will give her handouts on nutrition and diabetes. She was very thankful. I encouraged her to come to center and get her Blood sugar checked by nurse Nicole Cellaorothy. I will pass this on to her so she can check her in the future. Dover CorporationHelena Ysidra Sopher RN BSN CN [facility] PhD. [phone].

## 2017-09-18 ENCOUNTER — Ambulatory Visit: Payer: Medicaid Other | Admitting: *Deleted

## 2017-09-18 ENCOUNTER — Encounter: Payer: Medicaid Other | Attending: Obstetrics & Gynecology | Admitting: *Deleted

## 2017-09-18 ENCOUNTER — Encounter: Payer: Self-pay | Admitting: Obstetrics & Gynecology

## 2017-09-18 ENCOUNTER — Ambulatory Visit (INDEPENDENT_AMBULATORY_CARE_PROVIDER_SITE_OTHER): Payer: Medicaid Other | Admitting: Obstetrics & Gynecology

## 2017-09-18 VITALS — BP 113/71 | HR 88 | Wt 253.7 lb

## 2017-09-18 DIAGNOSIS — Z3480 Encounter for supervision of other normal pregnancy, unspecified trimester: Secondary | ICD-10-CM

## 2017-09-18 DIAGNOSIS — O24419 Gestational diabetes mellitus in pregnancy, unspecified control: Secondary | ICD-10-CM | POA: Insufficient documentation

## 2017-09-18 DIAGNOSIS — Z713 Dietary counseling and surveillance: Secondary | ICD-10-CM | POA: Insufficient documentation

## 2017-09-18 MED ORDER — ACCU-CHEK FASTCLIX LANCETS MISC: 1.0000 | Freq: Four times a day (QID) | 12 refills | Status: DC

## 2017-09-18 MED ORDER — GLUCOSE BLOOD VI STRP: ORAL_STRIP | 12 refills | Status: DC

## 2017-09-18 MED ORDER — ACCU-CHEK GUIDE W/DEVICE KIT: 1.0000 | PACK | Freq: Three times a day (TID) | 0 refills | Status: DC

## 2017-09-18 NOTE — Progress Notes (Signed)
  Patient was seen on 09/18/2017 for Gestational Diabetes self-management. EDD 11/06/2017. Patient states no history of GDM. Diet history obtained. Patient eats fair variety of all food groups, mostly African foods. She does not care for American foods. Beverages include coffee in the AM and water the rest of the day. She is complaining of heartburn so she eats only once or twice a day  The following learning objectives were met by the patient :   States the definition of Gestational Diabetes  States why dietary management is important in controlling blood glucose  Describes the effects of carbohydrates on blood glucose levels  Demonstrates ability to create a balanced meal plan  Demonstrates carbohydrate counting   States when to check blood glucose levels  Demonstrates proper blood glucose monitoring techniques  States the effect of stress and exercise on blood glucose levels  States the importance of limiting caffeine and abstaining from alcohol and smoking  Plan:  Aim for 3-4 Carb Choices per meal (45-60 grams) +/- 1 either way  Aim for 1-2 Carbs per snack Begin reading food labels for Total Carbohydrate of foods If OK with your MD, consider  increasing your activity level by walking, Arm Chair Exercises or other activity daily as tolerated Begin checking BG before breakfast and 2 hours after first bite of breakfast, lunch and dinner as directed by MD  Bring Log Book/Sheet to every medical appointment   Baby Scripts:  Patient was introduced to [employer] and plans to use as  record of BG electronically  Take medication if directed by MD  Blood glucose monitor Rx called into pharmacy: Accu Check Guide with Fast Clix drums Patient instructed to test pre breakfast and 2 hours each meal as directed by MD  Patient instructed to monitor glucose levels: FBS: 60 - 95 mg/dl 2 hour: <120 mg/dl  Patient received the following handouts:  Nutrition Diabetes and  Pregnancy  Carbohydrate Counting List  BG Log Sheet  Patient will be seen for follow-up as needed.

## 2017-09-18 NOTE — Patient Instructions (Signed)

## 2017-09-18 NOTE — Progress Notes (Signed)
   PRENATAL VISIT NOTE  Subjective:  Morgan Mathews is a 27 y.o. G3P2002 at 7651w0d being seen today for ongoing prenatal care.  She is currently monitored for the following issues for this high-risk pregnancy and has Hyperemesis affecting pregnancy, antepartum; Supervision of other normal pregnancy, antepartum; Encounter for fetal anatomic survey; Obesity affecting pregnancy in second trimester; [redacted] weeks gestation of pregnancy; and Gestational diabetes mellitus (GDM) in third trimester on their problem list.  Patient reports no complaints.  Contractions: Not present. Vag. Bleeding: None.  Movement: Present. Denies leaking of fluid.   The following portions of the patient's history were reviewed and updated as appropriate: allergies, current medications, past family history, past medical history, past social history, past surgical history and problem list. Problem list updated.  Objective:   Vitals:   09/18/17 1105  BP: 113/71  Pulse: 88  Weight: 253 lb 11.2 oz (115.1 kg)    Fetal Status: Fetal Heart Rate (bpm): 145   Movement: Present     General:  Alert, oriented and cooperative. Patient is in no acute distress.  Skin: Skin is warm and dry. No rash noted.   Cardiovascular: Normal heart rate noted  Respiratory: Normal respiratory effort, no problems with respiration noted  Abdomen: Soft, gravid, appropriate for gestational age.  Pain/Pressure: Present     Pelvic: Cervical exam deferred        Extremities: Normal range of motion.  Edema: None  Mental Status: Normal mood and affect. Normal behavior. Normal judgment and thought content.   Assessment and Plan:  Pregnancy: G3P2002 at 7051w0d  1. Supervision of other normal pregnancy, antepartum  - Tdap vaccine greater than or equal to 7yo IM  2. Gestational diabetes mellitus (GDM) in third trimester, gestational diabetes method of control unspecified Needs to start glucose testing and will pick up meter  Preterm labor symptoms and  general obstetric precautions including but not limited to vaginal bleeding, contractions, leaking of fluid and fetal movement were reviewed in detail with the patient. Please refer to After Visit Summary for other counseling recommendations.  Return in about 1 week (around 09/25/2017).  Future Appointments  Date Time Provider Department Center  10/13/2017  8:15 AM [facility] US 4 [facility]    Scheryl DarterJames Sareen Randon, MD

## 2017-09-18 NOTE — Addendum Note (Signed)
Addended by: Kathee DeltonHILLMAN, CARRIE L on: 09/18/2017 12:35 PM   Modules accepted: Orders

## 2017-09-25 ENCOUNTER — Encounter: Payer: Medicaid Other | Attending: Obstetrics & Gynecology | Admitting: *Deleted

## 2017-09-25 ENCOUNTER — Ambulatory Visit: Payer: Medicaid Other | Admitting: *Deleted

## 2017-09-25 DIAGNOSIS — O24419 Gestational diabetes mellitus in pregnancy, unspecified control: Secondary | ICD-10-CM

## 2017-09-25 DIAGNOSIS — Z713 Dietary counseling and surveillance: Secondary | ICD-10-CM | POA: Diagnosis present

## 2017-09-25 NOTE — Progress Notes (Signed)
Patient seen for follow up for GDM.  Per [facility], her fasting BG are elevated 95-99 mg/dl for 5 out of 6 days. Her post meal BG's are within target with the exception of 2 days after her lunch and supper they ranged from 126-144. She states she thinks it was when it was raining and she didn't get to go for her walks.   She states her last meal is by 7 PM and she doesn't eat again until breakfast the next day. I have asked her to have 1-2 Carb choices with some protein around 10 PM. She agrees to this recommendation.   I also discussed the option of doing some Arm Chair Exercises on days she cannot walk, and demonstrated some arm and leg exercises to try. Handout also provided. I suggested she do these after her lunch and supper meal if not able to walk outside.   Follow up as needed.

## 2017-10-02 ENCOUNTER — Encounter: Payer: Medicaid Other | Admitting: Obstetrics & Gynecology

## 2017-10-09 ENCOUNTER — Encounter: Payer: Medicaid Other | Admitting: Obstetrics & Gynecology

## 2017-10-13 ENCOUNTER — Ambulatory Visit (HOSPITAL_COMMUNITY): Payer: Medicaid Other

## 2017-10-23 ENCOUNTER — Other Ambulatory Visit (HOSPITAL_COMMUNITY): Payer: Self-pay | Admitting: Obstetrics and Gynecology

## 2017-10-23 ENCOUNTER — Encounter (HOSPITAL_COMMUNITY): Payer: Self-pay

## 2017-10-23 ENCOUNTER — Telehealth (HOSPITAL_COMMUNITY): Payer: Self-pay | Admitting: *Deleted

## 2017-10-23 ENCOUNTER — Ambulatory Visit (INDEPENDENT_AMBULATORY_CARE_PROVIDER_SITE_OTHER): Payer: Medicaid Other | Admitting: Family Medicine

## 2017-10-23 ENCOUNTER — Other Ambulatory Visit (HOSPITAL_COMMUNITY)
Admission: RE | Admit: 2017-10-23 | Discharge: 2017-10-23 | Disposition: A | Discharge status: Home / Self Care | Payer: Medicaid Other | Source: Ambulatory Visit | Attending: Family Medicine | Admitting: Family Medicine

## 2017-10-23 ENCOUNTER — Ambulatory Visit (HOSPITAL_COMMUNITY)
Admission: RE | Admit: 2017-10-23 | Discharge: 2017-10-23 | Disposition: A | Discharge status: Home / Self Care | Payer: Medicaid Other | Source: Ambulatory Visit

## 2017-10-23 ENCOUNTER — Ambulatory Visit (HOSPITAL_COMMUNITY)
Admission: RE | Admit: 2017-10-23 | Discharge: 2017-10-23 | Disposition: A | Discharge status: Home / Self Care | Payer: Medicaid Other | Source: Ambulatory Visit | Attending: Obstetrics and Gynecology | Admitting: Obstetrics and Gynecology

## 2017-10-23 VITALS — BP 121/73 | HR 101 | Wt 253.3 lb

## 2017-10-23 DIAGNOSIS — Z3A38 38 weeks gestation of pregnancy: Secondary | ICD-10-CM | POA: Insufficient documentation

## 2017-10-23 DIAGNOSIS — Z362 Encounter for other antenatal screening follow-up: Secondary | ICD-10-CM | POA: Diagnosis not present

## 2017-10-23 DIAGNOSIS — Z3483 Encounter for supervision of other normal pregnancy, third trimester: Secondary | ICD-10-CM | POA: Diagnosis present

## 2017-10-23 DIAGNOSIS — O99212 Obesity complicating pregnancy, second trimester: Secondary | ICD-10-CM | POA: Insufficient documentation

## 2017-10-23 DIAGNOSIS — Z23 Encounter for immunization: Secondary | ICD-10-CM | POA: Diagnosis not present

## 2017-10-23 DIAGNOSIS — O24410 Gestational diabetes mellitus in pregnancy, diet controlled: Secondary | ICD-10-CM

## 2017-10-23 DIAGNOSIS — O24419 Gestational diabetes mellitus in pregnancy, unspecified control: Secondary | ICD-10-CM | POA: Diagnosis not present

## 2017-10-23 DIAGNOSIS — Z3480 Encounter for supervision of other normal pregnancy, unspecified trimester: Secondary | ICD-10-CM

## 2017-10-23 MED ORDER — GLUCOSE BLOOD VI STRP: ORAL_STRIP | 12 refills | Status: DC

## 2017-10-23 MED ORDER — ACCU-CHEK FASTCLIX LANCETS MISC: 1.0000 | Freq: Four times a day (QID) | 12 refills | Status: DC

## 2017-10-23 NOTE — Procedures (Signed)
Morgan Mathews 1990-12-07 [redacted]w[redacted]d  Fetus A Non-Stress Test Interpretation for 10/23/17  Indication: Diabetes  Fetal Heart Rate A Mode: External Baseline Rate (A): 135 bpm Variability: Moderate Accelerations: 15 x 15 Decelerations: None Multiple birth?: No  Uterine Activity Mode: Palpation, Toco Contraction Frequency (min): x2 Contraction Duration (sec): 50-70 Contraction Quality: Mild Resting Tone Palpated: Relaxed Resting Time: Adequate  Interpretation (Fetal Testing) Nonstress Test Interpretation: Reactive Comments: Reviewed tracing with Dr. Judeth Cornfield

## 2017-10-23 NOTE — Progress Notes (Signed)
   PRENATAL VISIT NOTE  Subjective:  Morgan Mathews is a 27 y.o. G3P2002 at [redacted]w[redacted]d being seen today for ongoing prenatal care.  She is currently monitored for the following issues for this high-risk pregnancy and has Hyperemesis affecting pregnancy, antepartum; Supervision of other normal pregnancy, antepartum; Obesity affecting pregnancy in second trimester; and Gestational diabetes mellitus (GDM) in third trimester on their problem list.  Patient reports no complaints.  Contractions: Not present. Vag. Bleeding: None.  Movement: Present. Denies leaking of fluid.   The following portions of the patient's history were reviewed and updated as appropriate: allergies, current medications, past family history, past medical history, past social history, past surgical history and problem list. Problem list updated.  Objective:   Vitals:   10/23/17 0957  BP: 121/73  Pulse: (!) 101  Weight: 253 lb 4.8 oz (114.9 kg)    Fetal Status: Fetal Heart Rate (bpm): 142 Fundal Height: 39 cm Movement: Present     General:  Alert, oriented and cooperative. Patient is in no acute distress.  Skin: Skin is warm and dry. No rash noted.   Cardiovascular: Normal heart rate noted  Respiratory: Normal respiratory effort, no problems with respiration noted  Abdomen: Soft, gravid, appropriate for gestational age.  Pain/Pressure: Present     Pelvic: Cervical exam performed Dilation: Closed Effacement (%): 40 Station: Ballotable  Extremities: Normal range of motion.  Edema: None  Mental Status: Normal mood and affect. Normal behavior. Normal judgment and thought content.   Assessment and Plan:  Pregnancy: G3P2002 at [redacted]w[redacted]d  1. Supervision of other normal pregnancy, antepartum Cultures today - Culture, beta strep (group b only) - GC/Chlamydia probe amp ([place])not at [facility]  2. Diet controlled gestational diabetes mellitus (GDM) in third trimester CBGs are all over the place. Some weeks pp are out, some  fastings are out and pp are normal. No readings x 2 wks due to lack of supplies. No visit since 8/22--will plan for IOL at 39 wks--will need outpt foley  Has h/o shoulder dystocia and VAVD x 2--advised possible need for C-section if baby size is large. Risks of shoulder dystocia discussed.  - [place] MFM OB FOLLOW UP; Future--growth scheduled for today - glucose blood (ACCU-CHEK GUIDE) test strip; Use as instructed  Dispense: 100 each; Refill: 12 - ACCU-CHEK FASTCLIX LANCETS MISC; 1 Device by Percutaneous route 4 (four) times daily.  Dispense: 100 each; Refill: 12   Term labor symptoms and general obstetric precautions including but not limited to vaginal bleeding, contractions, leaking of fluid and fetal movement were reviewed in detail with the patient. Please refer to After Visit Summary for other counseling recommendations.  Return in 5 weeks (on 11/27/2017) for pp check, 1 wk for afternoon foley 10/2.  Future Appointments  Date Time Provider Department Center  10/29/2017  2:15 PM [facility] NST [facility] WOC  10/29/2017  3:15 PM Conan Bowens, MD [facility] WOC  10/30/2017  7:30 AM [facility] ROOM [facility]-BSS[facility]ED None    Reva Bores, MD

## 2017-10-23 NOTE — Patient Instructions (Addendum)
Try Hydrocortisone for your rash  BENEFITS OF BREASTFEEDING Many women wonder if they should breastfeed. Research shows that breast milk contains the perfect balance of vitamins, protein and fat that your baby needs to grow. It also contains antibodies that help your baby's immune system to fight off viruses and bacteria and can reduce the risk of sudden infant death syndrome (SIDS). In addition, the colostrum (a fluid secreted from the breast in the first few days after delivery) helps your newborn's digestive system to grow and function well. Breast milk is easier to digest than formula. Also, if your baby is born preterm, breast milk can help to reduce both short- and long-term health problems. BENEFITS OF BREASTFEEDING FOR MOM . Breastfeeding causes a hormone to be released that helps the uterus to contract and return to its normal size more quickly. . It aids in postpartum weight loss, reduces risk of breast and ovarian cancer, heart disease and rheumatoid arthritis. . It decreases the amount of bleeding after the baby is born. benefits of breastfeeding for baby . Provides comfort and nutrition . Protects baby against - Obesity - Diabetes - Asthma - Childhood cancers - Heart disease - Ear infections - Diarrhea - Pneumonia - Stomach problems - Serious allergies - Skin rashes . Promotes growth and development . Reduces the risk of baby having Sudden Infant Death Syndrome (SIDS) only breastmilk for the first 6 months . Protects baby against diseases/allergies . It's the perfect amount for tiny bellies . It restores baby's energy . Provides the best nutrition for baby . Giving water or formula can make baby more likely to get sick, decrease Mom's milk supply, make baby less content with breastfeeding Skin to Skin After delivery, the staff will place your baby on your chest. This helps with the following: . Regulates baby's temperature, breathing, heart rate and blood  sugar . Increases Mom's milk supply . Promotes bonding . Keeps baby and Mom calm and decreases baby's crying Rooming In Your baby will stay in your room with you for the entire time you are in the hospital. This helps with the following: . Allows Mom to learn baby's feeding cues - Fluttering eyes - Sucking on tongue or hand - Rooting (opens mouth and turns head) - Nuzzling into the breast - Bringing hand to mouth . Allows breastfeeding on demand (when your baby is ready) . Helps baby to be calm and content . Ensures a good milk supply . Prevents complications with breastfeeding . Allows parents to learn to care for baby . Allows you to request assistance with breastfeeding Importance of a good latch . Increases milk transfer to baby - baby gets enough milk . Ensures you have enough milk for your baby . Decreases nipple soreness . Don't use pacifiers and bottles - these cause baby to suck differently than breastfeeding . Promotes continuation of breastfeeding Risks of Formula Supplementation with Breastfeeding Giving your infant formula in addition to your breast-milk EXCEPT when medically necessary can lead to: Marland Kitchen Decreases your milk supply  . Loss of confidence in yourself for providing baby's nutrition  . Engorgement and possibly mastitis  . Asthma & allergies in the baby BREASTFEEDING FAQS How long should I breastfeed my baby? It is recommended that you provide your baby with breast milk only for the first 6 months and then continue for the first year and longer as desired. During the first few weeks after birth, your baby will need to feed 8-12 times every 24 hours, or every  2-3 hours. They will likely feed for 15-30 minutes. How can I help my baby begin breastfeeding? Babies are born with an instinct to breastfeed. A healthy baby can begin breastfeeding right away without specific help. At the hospital, a nurse (or lactation consultant) will help you begin the process and will  give you tips on good positioning. It may be helpful to take a breastfeeding class before you deliver in order to know what to expect. How can I help my baby latch on? In order to assist your baby in latching-on, cup your breast in your hand and stroke your baby's lower lip with your nipple to stimulate your baby's rooting reflex. Your baby will look like he or she is yawning, at which point you should bring the baby towards your breast, while aiming the nipple at the roof of his or her mouth. Remember to bring the baby towards you and not your breast towards the baby. How can I tell if my baby is latched-on? Your baby will have all of your nipple and part of the dark area around the nipple in his or her mouth and your baby's nose will be touching your breast. You should see or hear the baby swallowing. If the baby is not latched-on properly, start the process over. To remove the suction, insert a clean finger between your breast and the baby's mouth. Should I switch breasts during feeding? After feeding on one side, switch the baby to your other breast. If he or she does not continue feeding - that is OK. Your baby will not necessarily need to feed from both breasts in a single feeding. On the next feeding, start with the other breast for efficiency and comfort. How can I tell if my baby is hungry? When your baby is hungry, they will nuzzle against your breast, make sucking noises and tongue motions and may put their hands near their mouth. Crying is a late sign of hunger, so you should not wait until this point. When they have received enough milk, they will unlatch from the breast. Is it okay to use a pacifier? Until your baby gets the hang of breastfeeding, experts recommend limiting pacifier usage. If you have questions about this, please contact your pediatrician. What can I do to ensure proper nutrition while breastfeeding? . Make sure that you support your own health and your baby's by eating a  healthy, well-balanced diet . Your provider may recommend that you continue to take your prenatal vitamin . Drink plenty of fluids. It is a good rule to drink one glass of water before or after feeding . Alcohol will remain in the breast milk for as long as it will remain in the blood stream. If you choose to have a drink, it is recommended that you wait at least 2 hours before feeding . Moderate amounts of caffeine are OK . Some over-the-counter or prescription medications are not recommended during breastfeeding. Check with your provider if you have questions What types of birth control methods are safe while breastfeeding? Progestin-only methods, including a daily pill, an IUD, the implant and the injection are safe while breastfeeding. Methods that contain estrogen (such as combination birth control pills, the vaginal ring and the patch) should not be used during the first month of breastfeeding as these can decrease your milk supply.

## 2017-10-23 NOTE — Telephone Encounter (Signed)
Preadmission screen  

## 2017-10-24 LAB — GC/CHLAMYDIA PROBE AMP (~~LOC~~) NOT AT ARMC
Chlamydia: NEGATIVE
NEISSERIA GONORRHEA: NEGATIVE

## 2017-10-27 ENCOUNTER — Other Ambulatory Visit: Payer: Self-pay | Admitting: Family Medicine

## 2017-10-27 LAB — CULTURE, BETA STREP (GROUP B ONLY): STREP GP B CULTURE: NEGATIVE

## 2017-10-29 ENCOUNTER — Encounter: Payer: Self-pay | Admitting: Obstetrics and Gynecology

## 2017-10-29 ENCOUNTER — Ambulatory Visit (INDEPENDENT_AMBULATORY_CARE_PROVIDER_SITE_OTHER): Payer: Medicaid Other | Admitting: *Deleted

## 2017-10-29 ENCOUNTER — Ambulatory Visit (INDEPENDENT_AMBULATORY_CARE_PROVIDER_SITE_OTHER): Payer: Medicaid Other | Admitting: Obstetrics and Gynecology

## 2017-10-29 VITALS — BP 114/64 | HR 93 | Wt 250.0 lb

## 2017-10-29 DIAGNOSIS — O3663X0 Maternal care for excessive fetal growth, third trimester, not applicable or unspecified: Secondary | ICD-10-CM

## 2017-10-29 DIAGNOSIS — O09293 Supervision of pregnancy with other poor reproductive or obstetric history, third trimester: Secondary | ICD-10-CM

## 2017-10-29 DIAGNOSIS — O24410 Gestational diabetes mellitus in pregnancy, diet controlled: Secondary | ICD-10-CM

## 2017-10-29 DIAGNOSIS — Z3480 Encounter for supervision of other normal pregnancy, unspecified trimester: Secondary | ICD-10-CM

## 2017-10-29 DIAGNOSIS — O0993 Supervision of high risk pregnancy, unspecified, third trimester: Secondary | ICD-10-CM

## 2017-10-29 DIAGNOSIS — O99212 Obesity complicating pregnancy, second trimester: Secondary | ICD-10-CM

## 2017-10-29 NOTE — Progress Notes (Signed)
   PRENATAL VISIT NOTE  Subjective:  Morgan Mathews is a 27 y.o. G3P2002 at [redacted]w[redacted]d being seen today for ongoing prenatal care.  She is currently monitored for the following issues for this low-risk pregnancy and has Hyperemesis affecting pregnancy, antepartum; Supervision of other normal pregnancy, antepartum; Obesity affecting pregnancy in second trimester; and Gestational diabetes mellitus (GDM) in third trimester on their problem list.  Patient reports no complaints. Lots of active fetal movement.  Contractions: Not present. Vag. Bleeding: None.  Movement: Present. Denies leaking of fluid.   The following portions of the patient's history were reviewed and updated as appropriate: allergies, current medications, past family history, past medical history, past social history, past surgical history and problem list. Problem list updated.  Objective:   Vitals:   10/29/17 1435  BP: 114/64  Pulse: 93  Weight: 250 lb (113.4 kg)   Fetal Status: Fetal Heart Rate (bpm): NST   Movement: Present     General:  Alert, oriented and cooperative. Patient is in no acute distress.  Skin: Skin is warm and dry. No rash noted.   Cardiovascular: Normal heart rate noted  Respiratory: Normal respiratory effort, no problems with respiration noted  Abdomen: Soft, gravid, appropriate for gestational age.  Pain/Pressure: Present     Pelvic: Cervical exam deferred        Extremities: Normal range of motion.  Edema: None  Mental Status: Normal mood and affect. Normal behavior. Normal judgment and thought content.   Assessment and Plan:  Pregnancy: G3P2002 at [redacted]w[redacted]d  1. Supervision of high risk pregnancy in third trimester  2. Diet controlled gestational diabetes mellitus (GDM) in third trimester IOL scheduled for 39 weeks, FB placement today as she is not 39 weeks Reports morning CBGs are not good but after eating, CBGs are good Does not have any data since 9/16  3. Large for dates affecting management of  mother, third trimester, not applicable or unspecified fetus 10/23/17 EFW 4048 gms  4. Prior poor obstetrical history, antepartum, third trimester  6. Obesity affecting pregnancy in second trimester   Term labor symptoms and general obstetric precautions including but not limited to vaginal bleeding, contractions, leaking of fluid and fetal movement were reviewed in detail with the patient. Please refer to After Visit Summary for other counseling recommendations.  Return in about 4 weeks (around 11/26/2017) for post partum check.  Future Appointments  Date Time Provider Department Center  10/29/2017  3:15 PM Conan Bowens, MD [facility]  10/30/2017  7:30 AM [facility] RO[facility] None    Conan Bowens, MD

## 2017-10-29 NOTE — Progress Notes (Signed)
Pt here for foley bulb placement. IOL scheduled 10/3 @ 0730.  Pt states she is having frequent heartburn and vomiting after eating.

## 2017-10-30 ENCOUNTER — Encounter (HOSPITAL_COMMUNITY): Payer: Self-pay

## 2017-10-30 ENCOUNTER — Inpatient Hospital Stay (HOSPITAL_COMMUNITY)
Admission: RE | Admit: 2017-10-30 | Discharge: 2017-11-01 | DRG: 807 | Disposition: A | Discharge status: Home / Self Care | Payer: Medicaid Other | Attending: Obstetrics & Gynecology | Admitting: Obstetrics & Gynecology

## 2017-10-30 ENCOUNTER — Inpatient Hospital Stay (HOSPITAL_COMMUNITY): Payer: Medicaid Other | Admitting: Anesthesiology

## 2017-10-30 DIAGNOSIS — Z3480 Encounter for supervision of other normal pregnancy, unspecified trimester: Secondary | ICD-10-CM

## 2017-10-30 DIAGNOSIS — E669 Obesity, unspecified: Secondary | ICD-10-CM | POA: Diagnosis present

## 2017-10-30 DIAGNOSIS — O99214 Obesity complicating childbirth: Secondary | ICD-10-CM | POA: Diagnosis present

## 2017-10-30 DIAGNOSIS — Z3A39 39 weeks gestation of pregnancy: Secondary | ICD-10-CM | POA: Diagnosis not present

## 2017-10-30 DIAGNOSIS — O99212 Obesity complicating pregnancy, second trimester: Secondary | ICD-10-CM | POA: Diagnosis present

## 2017-10-30 DIAGNOSIS — O24419 Gestational diabetes mellitus in pregnancy, unspecified control: Secondary | ICD-10-CM | POA: Diagnosis present

## 2017-10-30 DIAGNOSIS — O24420 Gestational diabetes mellitus in childbirth, diet controlled: Principal | ICD-10-CM | POA: Diagnosis present

## 2017-10-30 LAB — RPR: RPR Ser Ql: NONREACTIVE

## 2017-10-30 LAB — GLUCOSE, CAPILLARY
GLUCOSE-CAPILLARY: 171 mg/dL — AB (ref 70–99)
GLUCOSE-CAPILLARY: 82 mg/dL (ref 70–99)
Glucose-Capillary: 78 mg/dL (ref 70–99)

## 2017-10-30 LAB — CBC
HEMATOCRIT: 35.9 % — AB (ref 36.0–46.0)
HEMOGLOBIN: 11.7 g/dL — AB (ref 12.0–15.0)
MCH: 33.9 pg (ref 26.0–34.0)
MCHC: 32.6 g/dL (ref 30.0–36.0)
MCV: 104.1 fL — ABNORMAL HIGH (ref 78.0–100.0)
Platelets: 224 10*3/uL (ref 150–400)
RBC: 3.45 MIL/uL — ABNORMAL LOW (ref 3.87–5.11)
RDW: 13.5 % (ref 11.5–15.5)
WBC: 5.8 10*3/uL (ref 4.0–10.5)

## 2017-10-30 LAB — TYPE AND SCREEN
ABO/RH(D): B POS
Antibody Screen: NEGATIVE

## 2017-10-30 MED ORDER — EPHEDRINE 5 MG/ML INJ: 10.0000 mg | INTRAVENOUS | Status: DC | PRN

## 2017-10-30 MED ORDER — TERBUTALINE SULFATE 1 MG/ML IJ SOLN
0.2500 mg | Freq: Once | INTRAMUSCULAR | Status: DC | PRN
  Filled 2017-10-30: qty 1

## 2017-10-30 MED ORDER — FENTANYL 2.5 MCG/ML BUPIVACAINE 1/10 % EPIDURAL INFUSION (WH - ANES)
14.0000 mL/h | INTRAMUSCULAR | Status: DC | PRN
  Administered 2017-10-30: 14 mL/h via EPIDURAL
  Filled 2017-10-30: qty 100

## 2017-10-30 MED ORDER — LACTATED RINGERS IV SOLN: 500.0000 mL | Freq: Once | INTRAVENOUS | Status: DC

## 2017-10-30 MED ORDER — PHENYLEPHRINE 40 MCG/ML (10ML) SYRINGE FOR IV PUSH (FOR BLOOD PRESSURE SUPPORT)
80.0000 ug | PREFILLED_SYRINGE | INTRAVENOUS | Status: DC | PRN
  Filled 2017-10-30: qty 10
  Filled 2017-10-30: qty 5

## 2017-10-30 MED ORDER — FENTANYL CITRATE (PF) 100 MCG/2ML IJ SOLN: 100.0000 ug | INTRAMUSCULAR | Status: DC | PRN

## 2017-10-30 MED ORDER — OXYTOCIN 40 UNITS IN LACTATED RINGERS INFUSION - SIMPLE MED
2.5000 [IU]/h | INTRAVENOUS | Status: DC
  Filled 2017-10-30: qty 1000

## 2017-10-30 MED ORDER — LIDOCAINE HCL (PF) 1 % IJ SOLN
INTRAMUSCULAR | Status: DC | PRN
  Administered 2017-10-30: 5 mL via EPIDURAL

## 2017-10-30 MED ORDER — ONDANSETRON HCL 4 MG/2ML IJ SOLN: 4.0000 mg | Freq: Four times a day (QID) | INTRAMUSCULAR | Status: DC | PRN

## 2017-10-30 MED ORDER — EPHEDRINE 5 MG/ML INJ
10.0000 mg | INTRAVENOUS | Status: DC | PRN
  Filled 2017-10-30: qty 2

## 2017-10-30 MED ORDER — LACTATED RINGERS IV SOLN: INTRAVENOUS | Status: DC

## 2017-10-30 MED ORDER — OXYTOCIN 40 UNITS IN LACTATED RINGERS INFUSION - SIMPLE MED
1.0000 m[IU]/min | INTRAVENOUS | Status: DC
  Administered 2017-10-30: 2 m[IU]/min via INTRAVENOUS

## 2017-10-30 MED ORDER — DIPHENHYDRAMINE HCL 50 MG/ML IJ SOLN: 12.5000 mg | INTRAMUSCULAR | Status: DC | PRN

## 2017-10-30 MED ORDER — SOD CITRATE-CITRIC ACID 500-334 MG/5ML PO SOLN: 30.0000 mL | ORAL | Status: DC | PRN

## 2017-10-30 MED ORDER — LACTATED RINGERS IV SOLN
500.0000 mL | INTRAVENOUS | Status: DC | PRN
  Administered 2017-10-30: 500 mL via INTRAVENOUS

## 2017-10-30 MED ORDER — MISOPROSTOL 50MCG HALF TABLET
50.0000 ug | ORAL_TABLET | ORAL | Status: DC | PRN
  Administered 2017-10-30 (×2): 50 ug via BUCCAL
  Filled 2017-10-30 (×3): qty 1

## 2017-10-30 MED ORDER — PHENYLEPHRINE 40 MCG/ML (10ML) SYRINGE FOR IV PUSH (FOR BLOOD PRESSURE SUPPORT): 80.0000 ug | PREFILLED_SYRINGE | INTRAVENOUS | Status: DC | PRN

## 2017-10-30 MED ORDER — PHENYLEPHRINE 40 MCG/ML (10ML) SYRINGE FOR IV PUSH (FOR BLOOD PRESSURE SUPPORT)
80.0000 ug | PREFILLED_SYRINGE | INTRAVENOUS | Status: DC | PRN
  Filled 2017-10-30: qty 5

## 2017-10-30 MED ORDER — OXYTOCIN BOLUS FROM INFUSION
500.0000 mL | Freq: Once | INTRAVENOUS | Status: AC
  Administered 2017-10-30: 500 mL via INTRAVENOUS

## 2017-10-30 MED ORDER — FLEET ENEMA 7-19 GM/118ML RE ENEM: 1.0000 | ENEMA | RECTAL | Status: DC | PRN

## 2017-10-30 MED ORDER — LIDOCAINE HCL (PF) 1 % IJ SOLN
30.0000 mL | INTRAMUSCULAR | Status: DC | PRN
  Filled 2017-10-30: qty 30

## 2017-10-30 NOTE — H&P (Addendum)
LABOR AND DELIVERY ADMISSION HISTORY AND PHYSICAL NOTE  Morgan Mathews is a 27 y.o. female G28P2002 with IUP at 56w0dby LMP/8 week U/S presenting for IOL for A1GDM.  She reports positive fetal movement. She denies leakage of fluid or vaginal bleeding.  [facility]: AP5T[facility] EGW >90% percentile, previous shoulder dystocia, previous vacuum delivery x2, hyperemesis affecting pregnancy, and obesity (BMI 40.67).   Past Medical History: Past Medical History:  Diagnosis Date  . Chronic bilateral thoracic back pain 08/28/2016  . Gestational diabetes   . 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 NO PAST SURGERIES      Obstetrical History: OB History    Gravida  3   Para  2   Term  2   Preterm  0   AB  0   Living  2     SAB  0   TAB  0   Ectopic  0   Multiple  0   Live Births  2           Social History: Social History   Socioeconomic History  . Marital status: Married    Spouse name: Musah Iddrisu  . Number of children: 2  . Years of education: 8  . Highest education level: Not on file  Occupational History  . Not on file  Social Needs  . Financial resource strain: Not on file  . Food insecurity:    Worry: Not on file    Inability: Not on file  . Transportation needs:    Medical: Not on file    Non-medical: Not on file  Tobacco Use  . Smoking status: Never Smoker  . Smokeless tobacco: Never Used  Substance and Sexual Activity  . Alcohol use: No  . Drug use: No  . Sexual activity: Yes    Birth control/protection: None  Lifestyle  . Physical activity:    Days per week: Not on file    Minutes per session: Not on file  . Stress: Not on file  Relationships  . Social connections:    Talks on phone: Not on file    Gets together: Not on file    Attends religious service: Not on file    Active  member of club or organization: Not on file    Attends meetings of clubs or organizations: Not on file    Relationship status: Not on file  Other Topics Concern  . Not on file  Social History Narrative   Originally from [place]  Came to [place] In 2015.   Lives at home with husband and 2 children.    Family History: History reviewed. No pertinent family history.  Allergies: No Known Allergies  Medications Prior to Admission  Medication Sig Dispense Refill Last Dose  . Prenatal Vit-Fe Fumarate-FA (PRENATAL COMPLETE) 14-0.4 MG TABS Take 1 tablet by mouth daily. 30 each 11 Past Week at Unknown time  . ACCU-CHEK FASTCLIX LANCETS MISC 1 Device by Percutaneous route 4 (four) times daily. 100 each 12 Taking  . Blood Glucose Monitoring Suppl (ACCU-CHEK GUIDE) w/Device KIT 1 Device by Does not apply route 4 (four) times daily - after meals and at bedtime. 1 kit 0 Taking  . glucose blood (ACCU-CHEK GUIDE) test strip Use as instructed 100 each 12 Taking     Review of Systems  All systems reviewed and negative except as stated  in HPI  Physical Exam Blood pressure 117/69, pulse 79, temperature 98.6 F (37 C), temperature source Oral, resp. rate 16, height 5' 6"  (1.676 m), weight 114.3 kg, last menstrual period 01/30/2017, not currently breastfeeding. General appearance: alert, oriented, NAD Lungs: normal respiratory effort Heart: regular rate Abdomen: soft, non-tender; gravid, FH appropriate for GA Extremities: No calf swelling or tenderness Presentation: cephalic Fetal monitoring: Baseline 130bpm, moderate var, + accel, Questionable variable decels, infrequent.  Uterine activity: Irregular  Dilation: 1 Effacement (%): 50 Station: Ballotable Exam by:: lee  Prenatal labs: ABO, Rh: --/--/B POS (10/03 0805) Antibody: NEG (10/03 0805) Rubella: 9.30 (03/25 1143) RPR: Non Reactive (07/16 0848)  HBsAg: Negative (03/25 1143)  HIV: Non Reactive (07/16 0848)  GC/Chlamydia: Negative  GBS:   Negative  2 hr GTT: at 1 hour 184.  Genetic screening: NIPS low risk Anatomy US: Normal, EGW >90%.   Prenatal Transfer Tool  Maternal Diabetes: Yes:  Diabetes Type:  Diet controlled Genetic Screening: Normal Maternal Ultrasounds/Referrals: Normal Fetal Ultrasounds or other Referrals:  None Maternal Substance Abuse:  No Significant Maternal Medications:  None Significant Maternal Lab Results: None Hgb 11.7.   Results for orders placed or performed during the hospital encounter of 10/30/17 (from the past 24 hour(s))  Type and screen   Collection Time: 10/30/17  8:05 AM  Result Value Ref Range   ABO/RH(D) B POS    Antibody Screen NEG    Sample Expiration      11/02/2017 Performed at [facility], [address] [place], [place] [address]   CBC   Collection Time: 10/30/17  8:06 AM  Result Value Ref Range   WBC 5.8 4.0 - 10.5 K/uL   RBC 3.45 (L) 3.87 - 5.11 MIL/uL   Hemoglobin 11.7 (L) 12.0 - 15.0 g/dL   HCT 35.9 (L) 36.0 - 46.0 %   MCV 104.1 (H) 78.0 - 100.0 fL   MCH 33.9 26.0 - 34.0 pg   MCHC 32.6 30.0 - 36.0 g/dL   RDW 13.5 11.5 - 15.5 %   Platelets 224 150 - 400 K/uL  Glucose, capillary   Collection Time: 10/30/17  8:30 AM  Result Value Ref Range   Glucose-Capillary 171 (H) 70 - 99 mg/dL  Glucose, capillary   Collection Time: 10/30/17 10:42 AM  Result Value Ref Range   Glucose-Capillary 82 70 - 99 mg/dL    Patient Active Problem List   Diagnosis Date Noted  . Gestational diabetes mellitus (GDM) in third trimester 08/14/2017  . Obesity affecting pregnancy in second trimester   . Supervision of other normal pregnancy, antepartum 04/21/2017  . Hyperemesis affecting pregnancy, antepartum 08/21/2015    Assessment: Morgan Mathews is a 27 y.o. G3P2002 at 57w0dhere for IOL for A1GDM. Pregnancy complicated by AG6YIR fetal EGW >90% percentile, previous shoulder dystocia, previous vacuum delivery x2, hyperemesis affecting pregnancy, and obesity (BMI 40.67). GBS  negative.   #Labor: IOL, start with cytotec x1 with serial cervical exams.  #Pain: Requesting Epidural  #FWB:?Cat 2 strip with infrequent mild variable decelerations. Will monitor closely.   #ID: GBS negative.  #MOF: Breastfeeding  #MOC: Undecided, presented options for patient to consider during stay.  #Circ: N/A  1. A1GDM: Stable, diagnosed in third trimester. Last CBG 82.   -Monitor CBG's q 4 hours during latent labor, increase to q2 during active  -Maintain random CBG <120, will consider treatment if elevated   2. Large EFW with history of shoulder dystocia in previous pregnancy: EFW 4048g per U/S 9/26. Increased  risk for shoulder dystocia, considering GDM and history of shoulder dystocia for 45 seconds, resolved with McRoberts and suprapubic pressure, with 3900g infant.   -Will avoid vacuum assisted delivery   [place] PGY-1  10/30/2017, 1:31 PM   OB FELLOW HISTORY AND PHYSICAL ATTESTATION  I have seen and examined this patient; I agree with above documentation in the resident's note.   Phill Myron, D.O. OB Fellow  10/30/2017, 3:37 PM

## 2017-10-30 NOTE — Progress Notes (Signed)
Pt complain of heaviness in her chest.  She stated that "it felt like someone was stepping on her chest"  She was also hypotensive (2248-109-47, 2250-82/42, 2251-116/68). She also complained of not being able to coughed. Patient was clear in all lung fields. IV bolus was started. Provider Drenda Freeze, CNM was notified. No orders received. Will continue to monitor patient.

## 2017-10-30 NOTE — Anesthesia Pain Management Evaluation Note (Signed)
  CRNA Pain Management Visit Note  Patient: Morgan Mathews, 27 y.o., female  "Hello I am a member of the anesthesia team at [facility]. We have an anesthesia team available at all times to provide care throughout the hospital, including epidural management and anesthesia for C-section. I don't know your plan for the delivery whether it a natural birth, water birth, IV sedation, nitrous supplementation, doula or epidural, but we want to meet your pain goals."   1.Was your pain managed to your expectations on prior hospitalizations?   Yes   2.What is your expectation for pain management during this hospitalization?     Epidural  3.How can we help you reach that goal? Epidural when ready  Record the patient's initial score and the patient's pain goal.   Pain: 3  Pain Goal: 6 [facility] wants you to be able to say your pain was always managed very well.  Edison Pace 10/30/2017

## 2017-10-30 NOTE — Anesthesia Preprocedure Evaluation (Signed)
Anesthesia Evaluation  Patient identified by MRN, date of birth, ID band Patient awake    Reviewed: Allergy & Precautions, NPO status , Patient's Chart, lab work & pertinent test results  Airway Mallampati: II  TM Distance: >3 FB Neck ROM: Full    Dental no notable dental hx. (+) Teeth Intact, Dental Advisory Given   Pulmonary neg pulmonary ROS,    Pulmonary exam normal breath sounds clear to auscultation       Cardiovascular negative cardio ROS Normal cardiovascular exam Rhythm:Regular Rate:Normal     Neuro/Psych negative neurological ROS     GI/Hepatic   Endo/Other  diabetes, Gestational  Renal/GU      Musculoskeletal   Abdominal (+) + obese,   Peds  Hematology   Anesthesia Other Findings   Reproductive/Obstetrics (+) Pregnancy                             Lab Results  Component Value Date   WBC 5.8 10/30/2017   HGB 11.7 (L) 10/30/2017   HCT 35.9 (L) 10/30/2017   MCV 104.1 (H) 10/30/2017   PLT 224 10/30/2017    Anesthesia Physical Anesthesia Plan  ASA: III  Anesthesia Plan: Epidural   Post-op Pain Management:    Induction:   PONV Risk Score and Plan:   Airway Management Planned:   Additional Equipment:   Intra-op Plan:   Post-operative Plan:   Informed Consent: I have reviewed the patients History and Physical, chart, labs and discussed the procedure including the risks, benefits and alternatives for the proposed anesthesia with the patient or authorized representative who has indicated his/her understanding and acceptance.     Plan Discussed with:   Anesthesia Plan Comments:         Anesthesia Quick Evaluation

## 2017-10-30 NOTE — Progress Notes (Signed)
LABOR PROGRESS NOTE  Morgan Mathews is a 27 y.o. G3P2002 at [redacted]w[redacted]d  admitted for IOL for A1GDM.   Subjective: Feeling painful contractions now, otherwise no complaints.   Objective: BP 120/79   Pulse 88   Temp 98.6 F (37 C) (Oral)   Resp 16   Ht 5\' 6"  (1.676 m)   Wt 114.3 kg   LMP 01/30/2017 (Exact Date)   BMI 40.67 kg/m  or  Vitals:   10/30/17 1227 10/30/17 1421 10/30/17 1502 10/30/17 1634  BP: 117/69 120/90 122/79 120/79  Pulse: 79 92 90 88  Resp: 16     Temp:      TempSrc:      Weight:      Height:       Last full cervical exam as below, Could not tolerate full cervical exam during this evaluation.  Dilation: 3 Effacement (%): 50 Cervical Position: Posterior Station: Ballotable Presentation: Vertex Exam by:: phillp  FHT: baseline rate 130s, moderate varibility, pos acel, no decel Toco: Irregular, every 2-5 min   Labs: Lab Results  Component Value Date   WBC 5.8 10/30/2017   HGB 11.7 (L) 10/30/2017   HCT 35.9 (L) 10/30/2017   MCV 104.1 (H) 10/30/2017   PLT 224 10/30/2017    Patient Active Problem List   Diagnosis Date Noted  . Gestational diabetes mellitus (GDM) in third trimester 08/14/2017  . Obesity affecting pregnancy in second trimester   . Supervision of other normal pregnancy, antepartum 04/21/2017  . Hyperemesis affecting pregnancy, antepartum 08/21/2015    Assessment / Plan: 27 y.o. G3P2002 at [redacted]w[redacted]d here for IOL for A1GDM. Pregnancy complicated by A1GDM, EGW >90% percentile, previous shoulder dystocia, previous vacuum delivery x2, hyperemesis affecting pregnancy, and obesity (BMI 40.67).   Labor: IOL. s/p cytotec x2, AROM @ 1416. Could not tolerate full cervical exam, pt requesting epidural now. Will check afterwards.  Fetal Wellbeing:  Cat 1 strip  Pain Control:  Epidural  Anticipated MOD:  NSVD   Leticia Penna, D.O. Family Medicine PGY-1  10/30/2017, 5:06 PM

## 2017-10-30 NOTE — Anesthesia Procedure Notes (Signed)
Epidural Patient location during procedure: OB Start time: 10/30/2017 5:17 PM End time: 10/30/2017 5:36 PM  Staffing Anesthesiologist: Trevor Iha, MD Performed: anesthesiologist   Preanesthetic Checklist Completed: patient identified, site marked, surgical consent, pre-op evaluation, timeout performed, IV checked, risks and benefits discussed and monitors and equipment checked  Epidural Patient position: sitting Prep: site prepped and draped and DuraPrep Patient monitoring: continuous pulse ox and blood pressure Approach: midline Location: L3-L4 Injection technique: LOR air  Needle:  Needle type: Tuohy  Needle gauge: 17 G Needle length: 9 cm and 9 Needle insertion depth: 6 cm Catheter type: closed end flexible Catheter size: 19 Gauge Catheter at skin depth: 11 cm Test dose: negative  Assessment Events: blood not aspirated, injection not painful, no injection resistance, negative IV test and no paresthesia  Additional Notes 1 attempt . Pt tolerated procedure well.

## 2017-10-30 NOTE — Progress Notes (Addendum)
LABOR PROGRESS NOTE  Morgan Mathews is a 27 y.o. G3P2002 at [redacted]w[redacted]d  admitted for IOL due to GDMA1 w EFW>90th percentile  Subjective: No painful contractions yet Feeling well  AROM during cook's balloon placement with copious clear fluid  Objective: BP 120/90   Pulse 92   Temp 98.6 F (37 C) (Oral)   Resp 16   Ht 5\' 6"  (1.676 m)   Wt 114.3 kg   LMP 01/30/2017 (Exact Date)   BMI 40.67 kg/m  or  Vitals:   10/30/17 0742 10/30/17 0753 10/30/17 1227 10/30/17 1421  BP:  118/79 117/69 120/90  Pulse:  (!) 121 79 92  Resp:  18 16   Temp:  98.6 F (37 C)    TempSrc:  Oral    Weight: 114.3 kg     Height: 5\' 6"  (1.676 m)       Dilation: 3 Effacement (%): 50 Cervical Position: Posterior Station: Ballotable Presentation: Vertex Exam by:: phillp FHT: baseline rate 130, moderate varibility, + acel, no decel Toco: irregular   Labs: Lab Results  Component Value Date   WBC 5.8 10/30/2017   HGB 11.7 (L) 10/30/2017   HCT 35.9 (L) 10/30/2017   MCV 104.1 (H) 10/30/2017   PLT 224 10/30/2017    Patient Active Problem List   Diagnosis Date Noted  . Gestational diabetes mellitus (GDM) in third trimester 08/14/2017  . Obesity affecting pregnancy in second trimester   . Supervision of other normal pregnancy, antepartum 04/21/2017  . Hyperemesis affecting pregnancy, antepartum 08/21/2015    Assessment / Plan: 27 y.o. G3P2002 at [redacted]w[redacted]d here for IOL  Labor: AROM @1416  Fetal Wellbeing:  Cat I Pain Control:  Desires epidural  Anticipated MOD:  SVD  Gwenevere Abbot, MD OB Fellow  10/30/2017, 2:26 PM

## 2017-10-30 NOTE — Progress Notes (Signed)
LABOR PROGRESS NOTE  Morgan Mathews is a 27 y.o. G3P2002 at [redacted]w[redacted]d  admitted for IOL for A1GDM.   Subjective: Doing well, had epidural placed.   Objective: BP 119/66   Pulse 95   Temp 98.6 F (37 C) (Oral)   Resp 16   Ht 5\' 6"  (1.676 m)   Wt 114.3 kg   LMP 01/30/2017 (Exact Date)   SpO2 98%   BMI 40.67 kg/m  or  Vitals:   10/30/17 1738 10/30/17 1740 10/30/17 1744 10/30/17 1751  BP: 112/67  125/84 119/66  Pulse: 96  92 95  Resp:      Temp:      TempSrc:      SpO2:  98%    Weight:      Height:        Dilation: 3 Effacement (%): 50 Cervical Position: Posterior Station: Ballotable Presentation: Vertex Exam by:: phillp FHT: baseline rate 130, moderate varibility, +acel, -decel Toco: Irregular every 2-5   Labs: Lab Results  Component Value Date   WBC 5.8 10/30/2017   HGB 11.7 (L) 10/30/2017   HCT 35.9 (L) 10/30/2017   MCV 104.1 (H) 10/30/2017   PLT 224 10/30/2017    Patient Active Problem List   Diagnosis Date Noted  . Gestational diabetes mellitus (GDM) in third trimester 08/14/2017  . Obesity affecting pregnancy in second trimester   . Supervision of other normal pregnancy, antepartum 04/21/2017  . Hyperemesis affecting pregnancy, antepartum 08/21/2015    Assessment / Plan: 27 y.o. G3P2002 at [redacted]w[redacted]d here for IOL for A1GDM.   Labor: IOL, s/p cytotec x2 and AROM. Start pit 2x2  Fetal Wellbeing:  Cat 1 strip Pain Control:  Epidural placed  Anticipated MOD:  NSVD   Leticia Penna, D.O. Family medicine PGY-1 10/30/2017, 6:39 PM

## 2017-10-31 ENCOUNTER — Other Ambulatory Visit: Payer: Self-pay

## 2017-10-31 ENCOUNTER — Encounter (HOSPITAL_COMMUNITY): Payer: Self-pay

## 2017-10-31 MED ORDER — DIBUCAINE 1 % RE OINT: 1.0000 "application " | TOPICAL_OINTMENT | RECTAL | Status: DC | PRN

## 2017-10-31 MED ORDER — BENZOCAINE-MENTHOL 20-0.5 % EX AERO: 1.0000 "application " | INHALATION_SPRAY | CUTANEOUS | Status: DC | PRN

## 2017-10-31 MED ORDER — ONDANSETRON HCL 4 MG PO TABS: 4.0000 mg | ORAL_TABLET | ORAL | Status: DC | PRN

## 2017-10-31 MED ORDER — DOCUSATE SODIUM 100 MG PO CAPS
100.0000 mg | ORAL_CAPSULE | Freq: Two times a day (BID) | ORAL | Status: DC
  Administered 2017-10-31 (×3): 100 mg via ORAL
  Filled 2017-10-31 (×3): qty 1

## 2017-10-31 MED ORDER — PRENATAL MULTIVITAMIN CH
1.0000 | ORAL_TABLET | Freq: Every day | ORAL | Status: DC
  Administered 2017-10-31: 1 via ORAL
  Filled 2017-10-31: qty 1

## 2017-10-31 MED ORDER — METHYLERGONOVINE MALEATE 0.2 MG PO TABS: 0.2000 mg | ORAL_TABLET | ORAL | Status: DC | PRN

## 2017-10-31 MED ORDER — ONDANSETRON HCL 4 MG/2ML IJ SOLN: 4.0000 mg | INTRAMUSCULAR | Status: DC | PRN

## 2017-10-31 MED ORDER — TRAMADOL HCL 50 MG PO TABS
50.0000 mg | ORAL_TABLET | Freq: Four times a day (QID) | ORAL | Status: DC | PRN
  Administered 2017-10-31: 50 mg via ORAL
  Filled 2017-10-31: qty 1

## 2017-10-31 MED ORDER — DIPHENHYDRAMINE HCL 25 MG PO CAPS
25.0000 mg | ORAL_CAPSULE | Freq: Four times a day (QID) | ORAL | Status: DC | PRN
  Administered 2017-10-31: 25 mg via ORAL
  Filled 2017-10-31: qty 1

## 2017-10-31 MED ORDER — METHYLERGONOVINE MALEATE 0.2 MG/ML IJ SOLN: 0.2000 mg | INTRAMUSCULAR | Status: DC | PRN

## 2017-10-31 MED ORDER — TETANUS-DIPHTH-ACELL PERTUSSIS 5-2.5-18.5 LF-MCG/0.5 IM SUSP: 0.5000 mL | Freq: Once | INTRAMUSCULAR | Status: DC

## 2017-10-31 MED ORDER — BISACODYL 10 MG RE SUPP: 10.0000 mg | Freq: Every day | RECTAL | Status: DC | PRN

## 2017-10-31 MED ORDER — ZOLPIDEM TARTRATE 5 MG PO TABS: 5.0000 mg | ORAL_TABLET | Freq: Every evening | ORAL | Status: DC | PRN

## 2017-10-31 MED ORDER — COCONUT OIL OIL: 1.0000 "application " | TOPICAL_OIL | Status: DC | PRN

## 2017-10-31 MED ORDER — MEASLES, MUMPS & RUBELLA VAC ~~LOC~~ INJ
0.5000 mL | INJECTION | Freq: Once | SUBCUTANEOUS | Status: DC
  Filled 2017-10-31: qty 0.5

## 2017-10-31 MED ORDER — IBUPROFEN 600 MG PO TABS
600.0000 mg | ORAL_TABLET | Freq: Four times a day (QID) | ORAL | Status: DC
  Administered 2017-10-31 – 2017-11-01 (×6): 600 mg via ORAL
  Filled 2017-10-31 (×6): qty 1

## 2017-10-31 MED ORDER — FERROUS SULFATE 325 (65 FE) MG PO TABS
325.0000 mg | ORAL_TABLET | Freq: Two times a day (BID) | ORAL | Status: DC
  Administered 2017-10-31 (×2): 325 mg via ORAL
  Filled 2017-10-31 (×2): qty 1

## 2017-10-31 MED ORDER — ACETAMINOPHEN 325 MG PO TABS: 650.0000 mg | ORAL_TABLET | ORAL | Status: DC | PRN

## 2017-10-31 MED ORDER — WITCH HAZEL-GLYCERIN EX PADS: 1.0000 "application " | MEDICATED_PAD | CUTANEOUS | Status: DC | PRN

## 2017-10-31 MED ORDER — SIMETHICONE 80 MG PO CHEW: 80.0000 mg | CHEWABLE_TABLET | ORAL | Status: DC | PRN

## 2017-10-31 MED ORDER — FLEET ENEMA 7-19 GM/118ML RE ENEM: 1.0000 | ENEMA | Freq: Every day | RECTAL | Status: DC | PRN

## 2017-10-31 NOTE — Lactation Note (Signed)
This note was copied from a baby's chart. Lactation Consultation Note  Patient Name: Morgan Mathews Today's Date: 10/31/2017 Reason for consult: Initial assessment;Term P3, 6 hour female  Infant with low blood sugar,  Mom is  doing STS, Mom hx of GDM in recent  Pregnancy. Mom BF her 46 month old son for 10 months. Mom has close spaced pregnancies.  Mom hand expressed 2 ml of colostrum spoon feed to infant. LC assisted mom in latching infant to right breast using cross cradle positron infant very sleepy and lethargy  Infant to sleepy BF at this time. Mom requesting formula to supplement with BF. LC discussed breast shells to use in between breastfeeding due mom having everted short shafted nipples. Per mom, she doesn't have pump at home. Harmony hand pump given and explained how to use, assembly, clean and reassemble by LC.  Nurse discussed risk  LEAD and  Infant was given 10 ml of formula per nurse .  LC discussed feed infant according hunger cues, 8 to 12 times within 24 hours. LC discussed I&O.  Mom made aware of O/P services, breastfeeding support groups, community resources, and our phone # for post-discharge questions.   Mom will:  1. Mom will continue to work towards latching infant to breast. 2. Mom will continue to do STS. 3. Mom will wear breast shells in bra in between breastfeeding to help elongate nipples out more. 4, Mom will ask for assistance from Nurse or LC with latching infant to breast.  Maternal Data Formula Feeding for Exclusion: No Has patient been taught Hand Expression?: Yes(Mom hand expressed 2 ml of colostrum that was spoon feed to infant.) Does the patient have breastfeeding experience prior to this delivery?: Yes  Feeding Feeding Type: (P) Bottle Fed - Formula  LATCH Score Latch: Too sleepy or reluctant, no latch achieved, no sucking elicited.  Audible Swallowing: None  Type of Nipple: Everted at rest and after stimulation(short  shafted)  Comfort (Breast/Nipple): Soft / non-tender  Hold (Positioning): Assistance needed to correctly position infant at breast and maintain latch.  LATCH Score: 5  Interventions Interventions: Breast feeding basics reviewed;Support pillows;Position options;Assisted with latch;Skin to skin;Breast massage;Hand express;Hand pump  Lactation Tools Discussed/Used Pump Review: Milk Storage;Setup, frequency, and cleaning Initiated by:: Morgan Mathews, IBCLC Date initiated:: 10/31/17   Consult Status Consult Status: Follow-up Date: 10/31/17 Follow-up type: In-patient    Morgan Mathews 10/31/2017, 4:42 AM

## 2017-10-31 NOTE — Lactation Note (Signed)
This note was copied from a baby's chart. Lactation Consultation Note  Patient Name: Morgan Mathews Today's Date: 10/31/2017 Reason for consult: Follow-up assessment Blood sugars have improved.  Mom states she doesn't have much milk.  Discussed colostrum and milk coming to volume.  Instructed to put baby to breast with cues before offering a bottle.  Encouraged to call out for assist prn.  Maternal Data    Feeding Feeding Type: Breast Fed  LATCH Score Latch: Grasps breast easily, tongue down, lips flanged, rhythmical sucking.  Audible Swallowing: A few with stimulation  Type of Nipple: Everted at rest and after stimulation  Comfort (Breast/Nipple): Soft / non-tender  Hold (Positioning): Assistance needed to correctly position infant at breast and maintain latch.  LATCH Score: 8  Interventions    Lactation Tools Discussed/Used     Consult Status Consult Status: Follow-up Date: 11/01/17 Follow-up type: In-patient    Huston Foley 10/31/2017, 11:58 AM

## 2017-10-31 NOTE — Progress Notes (Signed)
POSTPARTUM PROGRESS NOTE  Post Partum Day 1  Subjective:  Magda Coletta is a 27 y.o. G3P3003 s/p NSVD at [redacted]w[redacted]d around 2200 last night.  She reports she is doing well. No acute events following delivery. She denies any problems with ambulating, voiding or po intake. Denies any flatus or BM yet. Denies nausea or vomiting.  Pain is well controlled.  Lochia is mild. Endorses some chest discomfort with occasional cough, no difficulty breathing or pleuritic pain.   Objective: Blood pressure 112/68, pulse 82, temperature 98.6 F (37 C), temperature source Oral, resp. rate 18, height 5\' 6"  (1.676 m), weight 114.3 kg, last menstrual period 01/30/2017, SpO2 100 %, unknown if currently breastfeeding.  Physical Exam:  General: alert, cooperative and no distress Chest: no respiratory distress Heart:regular rate, distal pulses intact Lungs: CTA bilaterally, no increased WOB  Abdomen: soft, nontender, normoactive BS Uterine Fundus: firm, appropriately tender DVT Evaluation: No calf swelling or tenderness Extremities: no edema Skin: warm, dry  Recent Labs    10/30/17 0806  HGB 11.7*  HCT 35.9*    Assessment/Plan: Arihana Renz is a 27 y.o. [id number] s/p NSVD at [redacted]w[redacted]d. Uncomplicated delivery.   PPD#1 - Doing well  Routine postpartum care  Contraception: Undecided, considering nexplanon  Feeding: Breastfeeding  Dispo: Plan for discharge tomorrow.   LOS: 1 day   Leticia Penna, D.O. Family Medicine PGY-1 10/31/2017, 9:26 AM

## 2017-10-31 NOTE — Progress Notes (Signed)
Parent request formula to supplement breast feeding due to mother's fatigue and baby's low glucose level. She does not wish to pump at this time. Parents have been informed of small tummy size of newborn, taught hand expression and understand the possible consequences of formula to the health of the infant. The possible consequences shared with patient include 1) Loss of confidence in breastfeeding 2) Engorgement 3) Allergic sensitization of baby(asthma/allergies) and 4) decreased milk supply for mother.After discussion of the above the mother decided to  supplement with formula.  The tool used to give formula supplement will be bottle with slow flow nipple given by dad.   Mother counseled to avoid artificial nipples because this practice may lead to latch difficulties,inadequate milk transfer and nipple soreness.

## 2017-10-31 NOTE — Anesthesia Postprocedure Evaluation (Signed)
Anesthesia Post Note  Patient: Morgan Mathews  Procedure(s) Performed: AN AD HOC LABOR EPIDURAL     Patient location during evaluation: Mother Baby Anesthesia Type: Epidural Level of consciousness: awake and alert Pain management: pain level controlled Vital Signs Assessment: post-procedure vital signs reviewed and stable Respiratory status: spontaneous breathing Cardiovascular status: stable Postop Assessment: no headache, patient able to bend at knees, no backache, no apparent nausea or vomiting, epidural receding, adequate PO intake and able to ambulate Anesthetic complications: no    Last Vitals:  Vitals:   10/31/17 0208 10/31/17 0613  BP: 115/69 112/68  Pulse: 83 82  Resp: 18 18  Temp: 36.9 C 37 C  SpO2:  100%    Last Pain:  Vitals:   10/31/17 0710  TempSrc:   PainSc: 0-No pain   Pain Goal:                 [name]

## 2017-11-01 LAB — BIRTH TISSUE RECOVERY COLLECTION (PLACENTA DONATION)

## 2017-11-01 MED ORDER — ACETAMINOPHEN 325 MG PO TABS: 650.0000 mg | ORAL_TABLET | ORAL | 0 refills | Status: DC | PRN

## 2017-11-01 MED ORDER — DOCUSATE SODIUM 100 MG PO CAPS: 100.0000 mg | ORAL_CAPSULE | Freq: Two times a day (BID) | ORAL | 0 refills | Status: DC

## 2017-11-01 MED ORDER — WITCH HAZEL-GLYCERIN EX PADS: 1.0000 "application " | MEDICATED_PAD | CUTANEOUS | 12 refills | Status: DC | PRN

## 2017-11-01 MED ORDER — IBUPROFEN 600 MG PO TABS: 600.0000 mg | ORAL_TABLET | Freq: Four times a day (QID) | ORAL | 0 refills | Status: DC

## 2017-11-01 NOTE — Discharge Summary (Signed)
Postpartum Discharge Summary     Patient Name: Morgan Mathews DOB: [birth year] MRN: [id number]  Date of admission: 10/30/2017 Delivering Provider: Christin Fudge   Date of discharge: 11/01/2017  Admitting diagnosis: INDUCTION Intrauterine pregnancy: [redacted]w[redacted]d    Secondary diagnosis:  Principal Problem:   Gestational diabetes mellitus (GDM) in third trimester Active Problems:   Supervision of other normal pregnancy, antepartum   Obesity affecting pregnancy in second trimester  Additional problems: NA     Discharge diagnosis: Term Pregnancy Delivered                                                                                                Post partum procedures:N/A  Augmentation: AROM and Cytotec  Complications: None  Hospital course:  Induction of Labor With Vaginal Delivery   27y.o. yo G3P3003 at 329w0das admitted to the hospital 10/30/2017 for induction of labor.  Indication for induction: A1 DM.  Patient had an uncomplicated labor course as follows: Membrane Rupture Time/Date: 2:16 PM ,10/30/2017   Intrapartum Procedures: Episiotomy: None [1]                                         Lacerations:  None [1]  Patient had delivery of a Viable infant.  Information for the patient's newborn:  [place], Girl Monquie [[id number]]Delivery Method: Vaginal, Spontaneous(Filed from Delivery Summary)   10/30/2017  Details of delivery can be found in separate delivery note.  Patient had a routine postpartum course. Patient is discharged home 11/01/17.  Magnesium Sulfate recieved: No BMZ received: No  Physical exam  Vitals:   10/31/17 0613 10/31/17 1417 10/31/17 2250 11/01/17 0500  BP: 112/68 109/70 115/75 130/90  Pulse: 82 77 79 63  Resp: 18 18    Temp: 98.6 F (37 C) 98.9 F (37.2 C) (!) 97.5 F (36.4 C) 98 F (36.7 C)  TempSrc: Oral Oral Oral Oral  SpO2: 100% 97%    Weight:      Height:       General: alert, cooperative and no distress Lochia:  appropriate Uterine Fundus: firm Incision: N/A DVT Evaluation: No evidence of DVT seen on physical exam. Negative Homan's sign. Labs: Lab Results  Component Value Date   WBC 5.8 10/30/2017   HGB 11.7 (L) 10/30/2017   HCT 35.9 (L) 10/30/2017   MCV 104.1 (H) 10/30/2017   PLT 224 10/30/2017   CMP Latest Ref Rng & Units 04/07/2017  Glucose 65 - 99 mg/dL 107(H)  BUN 6 - 20 mg/dL 8  Creatinine 0.44 - 1.00 mg/dL 0.56  Sodium 135 - 145 mmol/L 131(L)  Potassium 3.5 - 5.1 mmol/L 3.0(L)  Chloride 101 - 111 mmol/L 95(L)  CO2 22 - 32 mmol/L 20(L)  Calcium 8.9 - 10.3 mg/dL 9.8  Total Protein 6.5 - 8.1 g/dL 8.8(H)  Total Bilirubin 0.3 - 1.2 mg/dL 5.7(H)  Alkaline Phos 38 - 126 U/L 96  AST 15 - 41 U/L 450(H)  ALT 14 - 54 U/L 649(H)  Discharge instruction: per After Visit Summary and "Baby and Me Booklet".  After visit meds:  Allergies as of 11/01/2017   No Known Allergies     Medication List    STOP taking these medications   ACCU-CHEK FASTCLIX LANCETS Misc   ACCU-CHEK GUIDE w/Device Kit   glucose blood test strip     TAKE these medications   acetaminophen 325 MG tablet Commonly known as:  TYLENOL Take 2 tablets (650 mg total) by mouth every 4 (four) hours as needed (for pain scale < 4).   docusate sodium 100 MG capsule Commonly known as:  COLACE Take 1 capsule (100 mg total) by mouth 2 (two) times daily.   ibuprofen 600 MG tablet Commonly known as:  ADVIL,MOTRIN Take 1 tablet (600 mg total) by mouth every 6 (six) hours.   PRENATAL COMPLETE 14-0.4 MG Tabs Take 1 tablet by mouth daily.   witch hazel-glycerin pad Commonly known as:  TUCKS Apply 1 application topically as needed for hemorrhoids.       Diet: routine diet  Activity: Advance as tolerated. Pelvic rest for 6 weeks.   Outpatient follow up:4 weeks, needs 2 hour GTT at visit Follow up Appt:No future appointments. Follow up Visit:No follow-ups on file.   Please schedule this patient for Postpartum  visit in: 4 weeks with the following provider: Any provider For C/S patients schedule nurse incision check in weeks 2 weeks: no Low risk pregnancy complicated by: GDM Delivery mode:  SVD Anticipated Birth Control:  other/unsure PP Procedures needed: 2 hour GTT  Schedule Integrated [place] visit: no      Newborn Data: Live born female  Birth Weight: 8 lb 9 oz (3884 g) APGAR: 59, 9  Newborn Delivery   Birth date/time:  10/30/2017 22:10:00 Delivery type:  Vaginal, Spontaneous     Baby Feeding: Bottle Disposition:home with mother   11/01/2017 Darlina Rumpf, CNM

## 2017-11-01 NOTE — Discharge Instructions (Signed)

## 2017-12-03 ENCOUNTER — Ambulatory Visit (INDEPENDENT_AMBULATORY_CARE_PROVIDER_SITE_OTHER): Payer: Medicaid Other | Admitting: Nurse Practitioner

## 2017-12-03 ENCOUNTER — Encounter: Payer: Self-pay | Admitting: *Deleted

## 2017-12-03 DIAGNOSIS — Z6830 Body mass index (BMI) 30.0-30.9, adult: Secondary | ICD-10-CM | POA: Insufficient documentation

## 2017-12-03 DIAGNOSIS — Z30430 Encounter for insertion of intrauterine contraceptive device: Secondary | ICD-10-CM | POA: Diagnosis not present

## 2017-12-03 DIAGNOSIS — Z1389 Encounter for screening for other disorder: Secondary | ICD-10-CM | POA: Diagnosis not present

## 2017-12-03 LAB — POCT PREGNANCY, URINE: PREG TEST UR: NEGATIVE

## 2017-12-03 MED ORDER — LEVONORGESTREL 19.5 MCG/DAY IU IUD
INTRAUTERINE_SYSTEM | Freq: Once | INTRAUTERINE | Status: AC
  Administered 2017-12-03: 11:00:00 via INTRAUTERINE

## 2017-12-03 NOTE — Patient Instructions (Signed)

## 2017-12-03 NOTE — Progress Notes (Signed)
Subjective:     Morgan Mathews is a 27 y.o. female who presents for a postpartum visit. She is 5 weeks postpartum following a spontaneous vaginal delivery. I have fully reviewed the prenatal and intrapartum course. The delivery was at 39.3 gestational weeks. Outcome: spontaneous vaginal delivery. Anesthesia: epidural. Postpartum course has been unremarkable. Baby's course has been unremarkable. Baby is feeding by breast. Bleeding no bleeding. Bowel function is normal. Bladder function is normal. Patient is not sexually active. Contraception method is IUD. To be inserted at today's visit.  Postpartum depression screening: negative.  The following portions of the patient's history were reviewed and updated as appropriate: allergies, current medications, past family history, past medical history, past social history, past surgical history and problem list.  Review of Systems Pertinent items noted in HPI and remainder of comprehensive ROS otherwise negative.   Objective:    BP (!) 103/48   Pulse (!) 46   Wt 188 lb (85.3 kg)   BMI 30.34 kg/m   General:  alert, cooperative and no distress   Breasts:  deferred due to breastfeeding  Lungs: clear to auscultation bilaterally  Heart:  regular rate and rhythm, S1, S2 normal, no murmur, click, rub or gallop  Abdomen: soft, non-tender; bowel sounds normal; no masses,  no organomegaly   Vulva:  normal  Vagina: normal vagina  Cervix:  no lesions  Corpus: difficult to size due to habitus  Adnexa:  no mass, fullness, tenderness  Rectal Exam: Not performed.        IUD Insertion Procedure Note Patient identified, informed consent performed, consent signed.   Discussed risks of irregular bleeding, cramping, infection, malpositioning or misplacement of the IUD outside the uterus which may require further procedure such as laparoscopy. Time out was performed.  Urine pregnancy test negative.  Speculum placed in the vagina.  Cervix visualized.  Cleaned with  Betadine x 2.  Hurricane spray used.  Grasped anteriorly with a single tooth tenaculum.  Uterus sounded to 9 cm.  Liletta IUD placed per manufacturer's recommendations.  Strings trimmed to 3 cm. Tenaculum was removed, good hemostasis noted.  Patient tolerated procedure well.   Patient was given post-procedure instructions.  She was advised to have backup contraception for one week.  Patient was also asked to check IUD strings periodically and follow up in 4 weeks for IUD check.    Assessment:    Normal postpartum exam. Pap smear not done at today's visit.  due 03-2020 IUD insertion done  Plan:    1. Contraception: IUD 2. Advised to continue  3. Follow up in: 4 weeks or as needed.   See AVS for additional info given to client. Client did not schedule postpartum glucola so will have clinic contact her to get glucola scheduled.  Nolene Bernheim, RN, MSN, NP-BC Nurse Practitioner, [facility] for [employer], [facility] 12/03/2017 1:22 PM

## 2017-12-08 ENCOUNTER — Other Ambulatory Visit: Payer: Self-pay | Admitting: *Deleted

## 2017-12-08 DIAGNOSIS — Z8632 Personal history of gestational diabetes: Secondary | ICD-10-CM

## 2017-12-09 ENCOUNTER — Other Ambulatory Visit: Payer: Medicaid Other

## 2017-12-09 DIAGNOSIS — Z8632 Personal history of gestational diabetes: Secondary | ICD-10-CM

## 2017-12-10 LAB — GLUCOSE TOLERANCE, 2 HOURS
Glucose, 2 hour: 95 mg/dL (ref 65–139)
Glucose, GTT - Fasting: 70 mg/dL (ref 65–99)

## 2018-01-01 ENCOUNTER — Encounter: Payer: Self-pay | Admitting: Nurse Practitioner

## 2018-01-01 ENCOUNTER — Ambulatory Visit (INDEPENDENT_AMBULATORY_CARE_PROVIDER_SITE_OTHER): Payer: Medicaid Other | Admitting: Nurse Practitioner

## 2018-01-01 VITALS — BP 109/67 | HR 74 | Wt 226.4 lb

## 2018-01-01 DIAGNOSIS — Z975 Presence of (intrauterine) contraceptive device: Secondary | ICD-10-CM | POA: Insufficient documentation

## 2018-01-01 NOTE — Progress Notes (Signed)
    GYNECOLOGY OFFICE ENCOUNTER NOTE  History:  27 y.o. [id number] here today for today for IUD string check; Liletta  IUD was placed  12-03-17. No complaints about the IUD, no concerning side effects.  Has not had intercourse with this IUD yet.  The following portions of the patient's history were reviewed and updated as appropriate: allergies, current medications, past family history, past medical history, past social history, past surgical history and problem list. Last pap smear on 04-21-17  was normal.  Review of Systems:  Pertinent items are noted in HPI.   Objective:  Physical Exam Blood pressure 109/67, pulse 74, weight 226 lb 6.4 oz (102.7 kg), unknown if currently breastfeeding. CONSTITUTIONAL: Well-developed, well-nourished female in no acute distress.  HENT:  Normocephalic, atraumatic. External right and left ear normal. Oropharynx is clear and moist EYES: Conjunctivae and EOM are normal. Pupils are equal, round, and reactive to light. No scleral icterus.  NECK: Normal range of motion, supple, no masses CARDIOVASCULAR: Normal heart rate noted RESPIRATORY: Effort and breath sounds normal, no problems with respiration noted ABDOMEN: Soft, no distention noted.   PELVIC: Normal appearing external genitalia; normal appearing vaginal mucosa and cervix.  IUD strings visualized, about 3 cm in length outside cervix.  Client declines offer to trim the strings.  Assessment & Plan:  Patient to keep IUD in place for up to seven years; can come in for removal if she desires pregnancy earlier or for or concerning side effects. Advised to use OTC ibuprofen with food by the package directions for any cramping. See AVS for additional instructions  Nolene BernheimERRI BURLESON, RN, MSN, NP-BC Nurse Practitioner, [facility] for [employer] [facility], [facility] 01/01/2018 5:57 PM

## 2018-02-12 IMAGING — US US ABDOMEN LIMITED
1 series · 15 of 25 positions shown · non-contrast
Comparison: None in PACs

CLINICAL DATA: The patient is 38 weeks pregnant and is and is
experiencing chest and epigastric pain with elevated liver enzymes.

EXAM:
US ABDOMEN LIMITED - RIGHT UPPER QUADRANT

[Series 1: us abdomen limited · 15 of 76 slices shown]
[im 1/76]
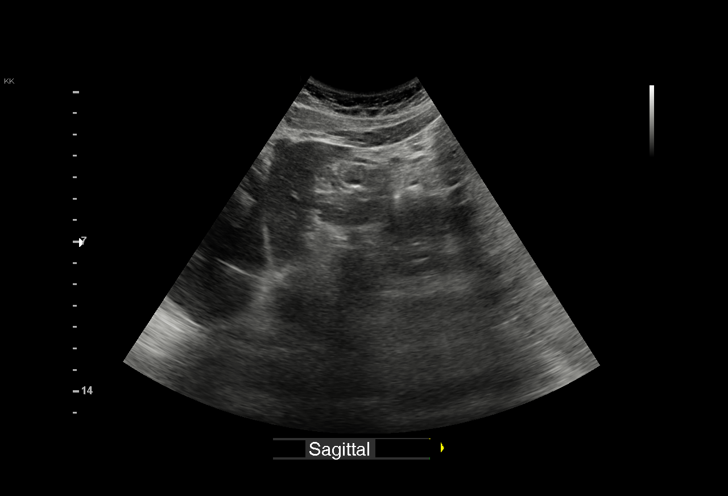
[im 7/76]
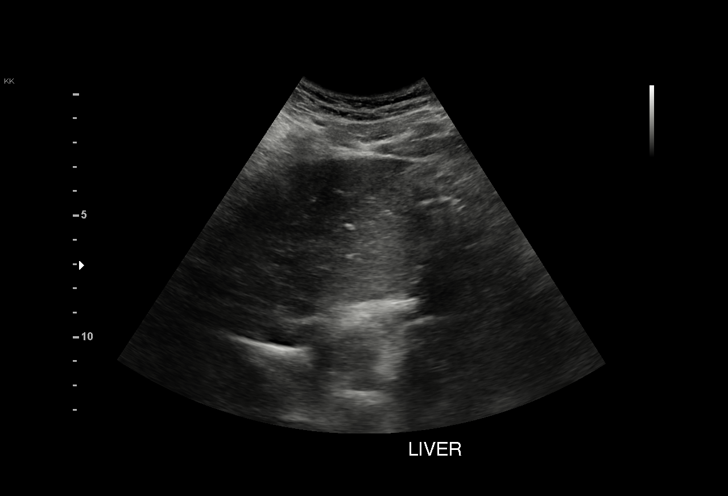
[im 13/76]
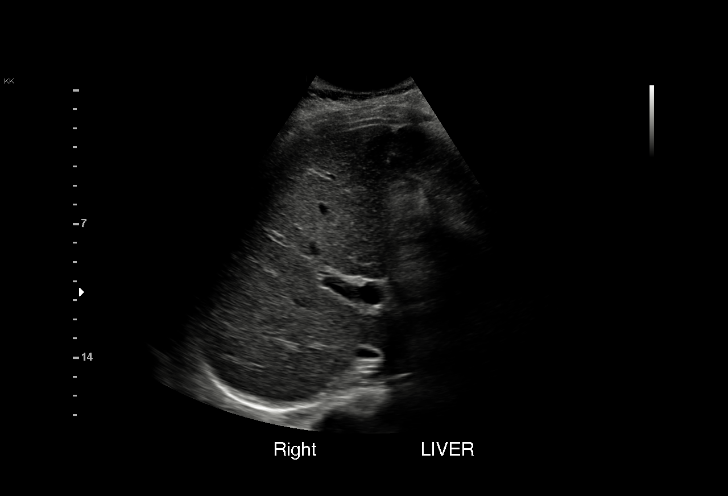
[im 16/76]
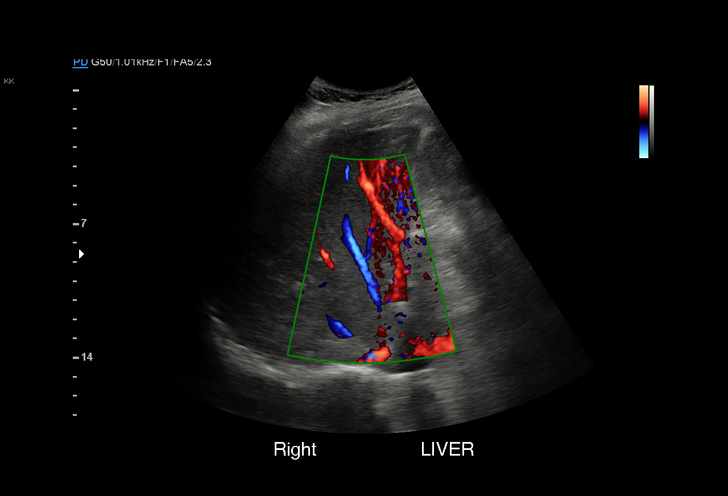
[im 22/76]
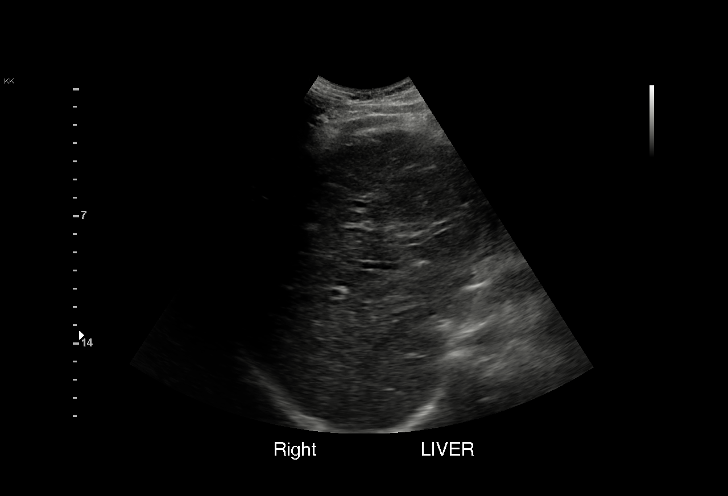
[im 29/76]
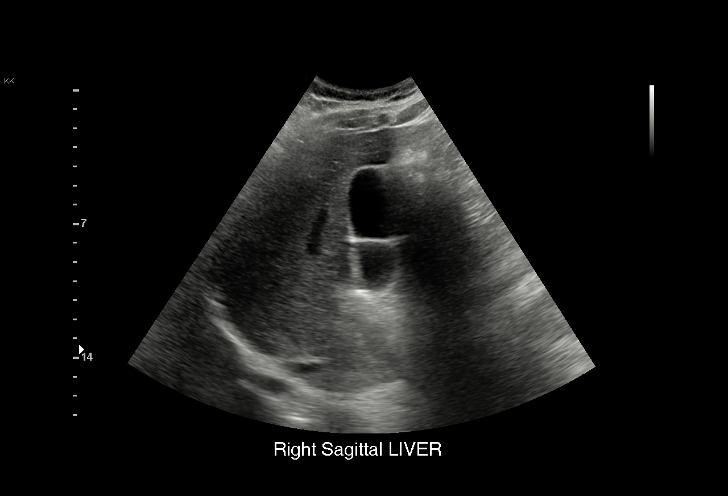
[im 32/76]
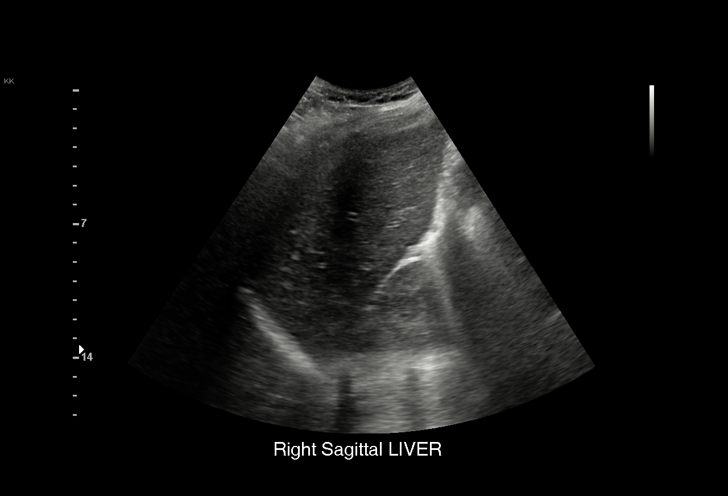
[im 38/76]
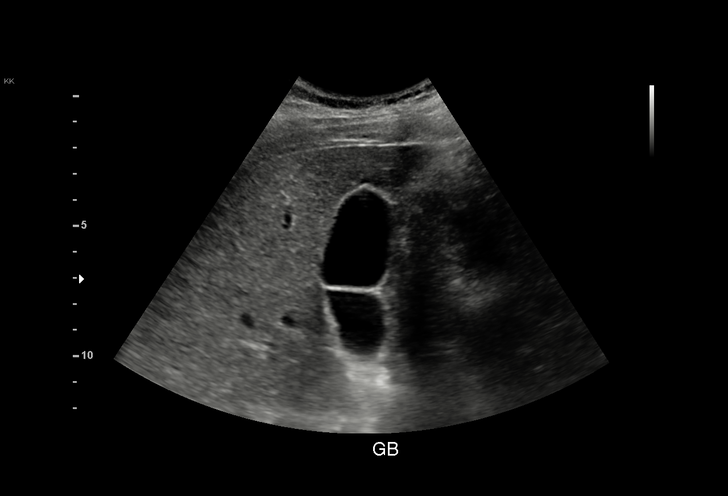
[im 44/76]
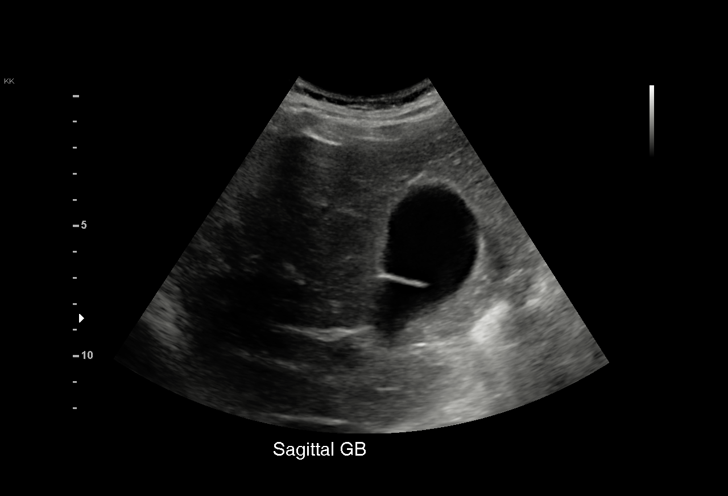
[im 47/76]
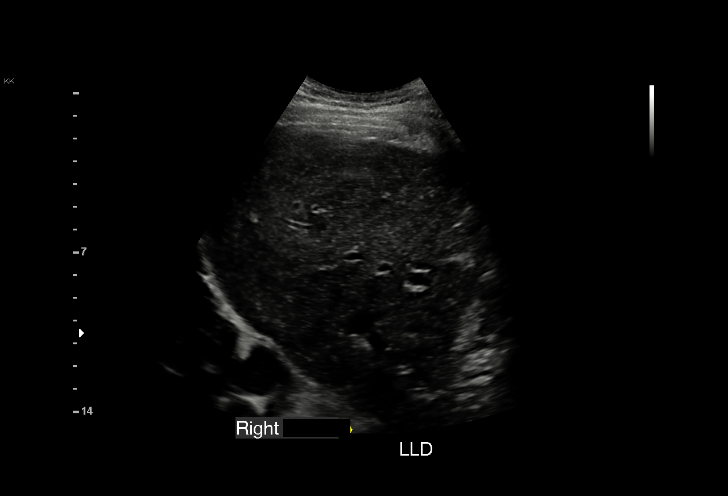
[im 54/76]
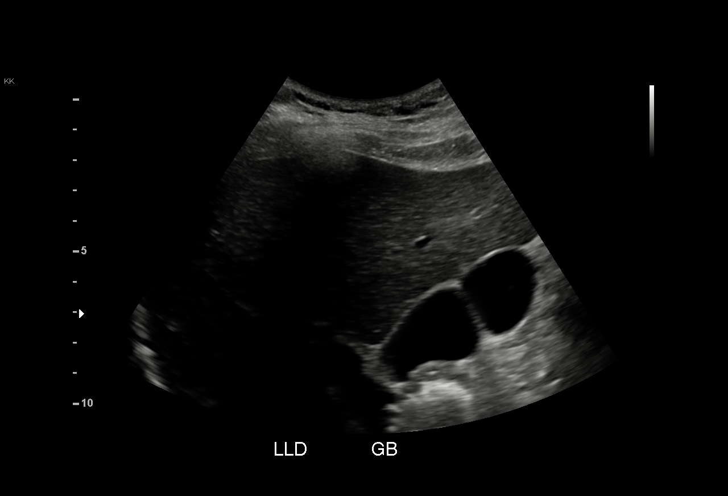
[im 60/76]
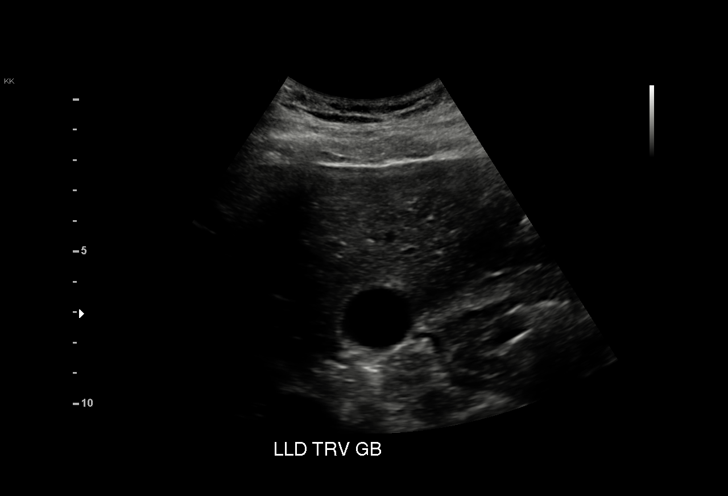
[im 63/76]
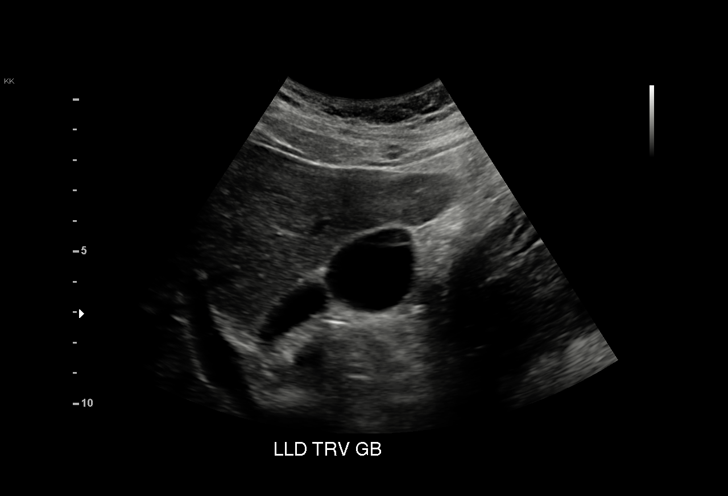
[im 69/76]
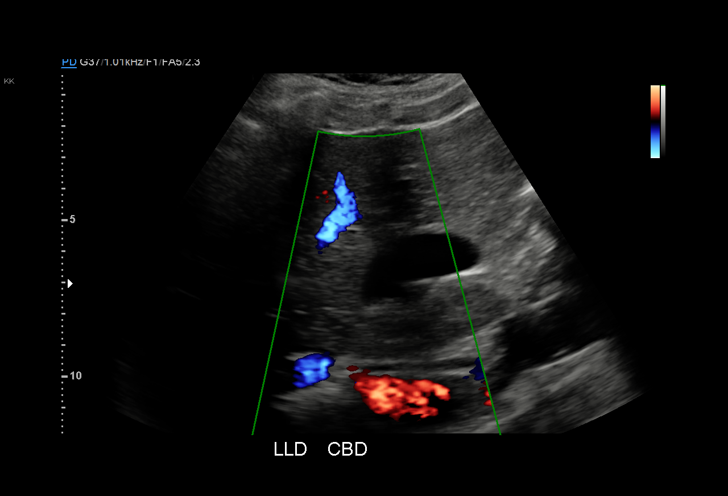
[im 76/76]
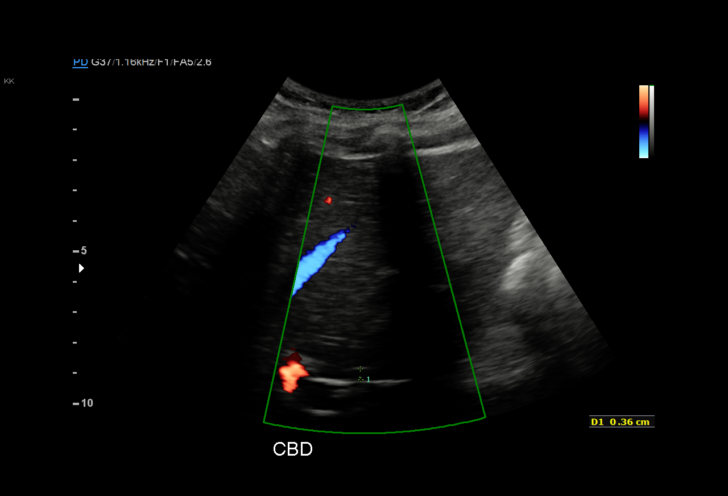

[15 of 25 positions shown; findings below may reference images not displayed]

FINDINGS: Gallbladder:

The gallbladder is adequately distended. There is a prominent fold
in the fundus. There is no gallbladder wall thickening,
pericholecystic fluid, or positive sonographic Murphy's sign.

Common bile duct:

Diameter: 2.7 mm

Liver:

The hepatic echotexture is subjectively mildly increased. There is
no focal mass or ductal dilation. The surface contour of the liver
is normal.
IMPRESSION: No gallstones or sonographic evidence of acute cholecystitis.

Normal appearance of the common bile duct. Mildly increased hepatic
echotexture likely reflects fatty infiltrative change.

## 2018-02-12 IMAGING — CR DG CHEST 2V
2 series · 2 of 2 positions shown · non-contrast
Comparison: None.

CLINICAL DATA: Pregnant patient with mid chest pain.

EXAM:
CHEST  2 VIEW

[chest pa]
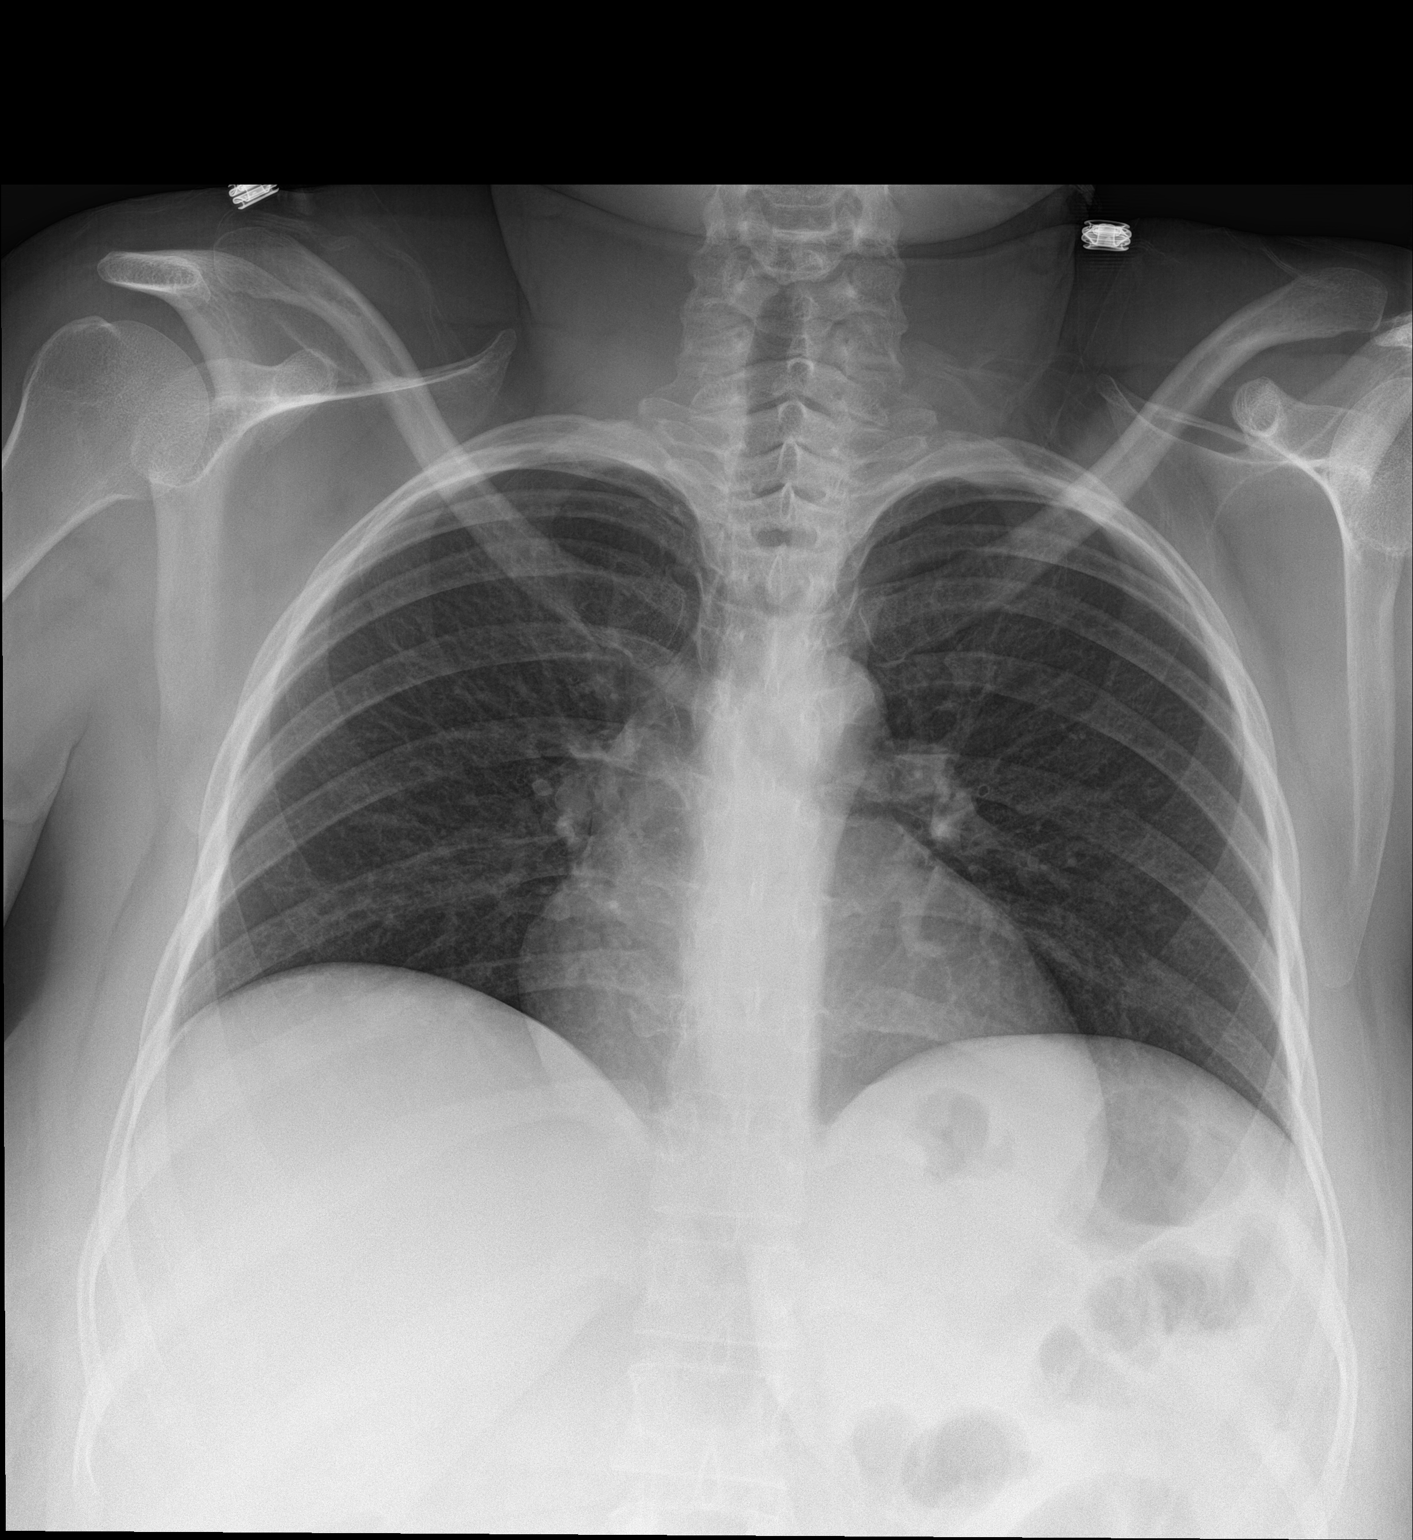

[chest lat]
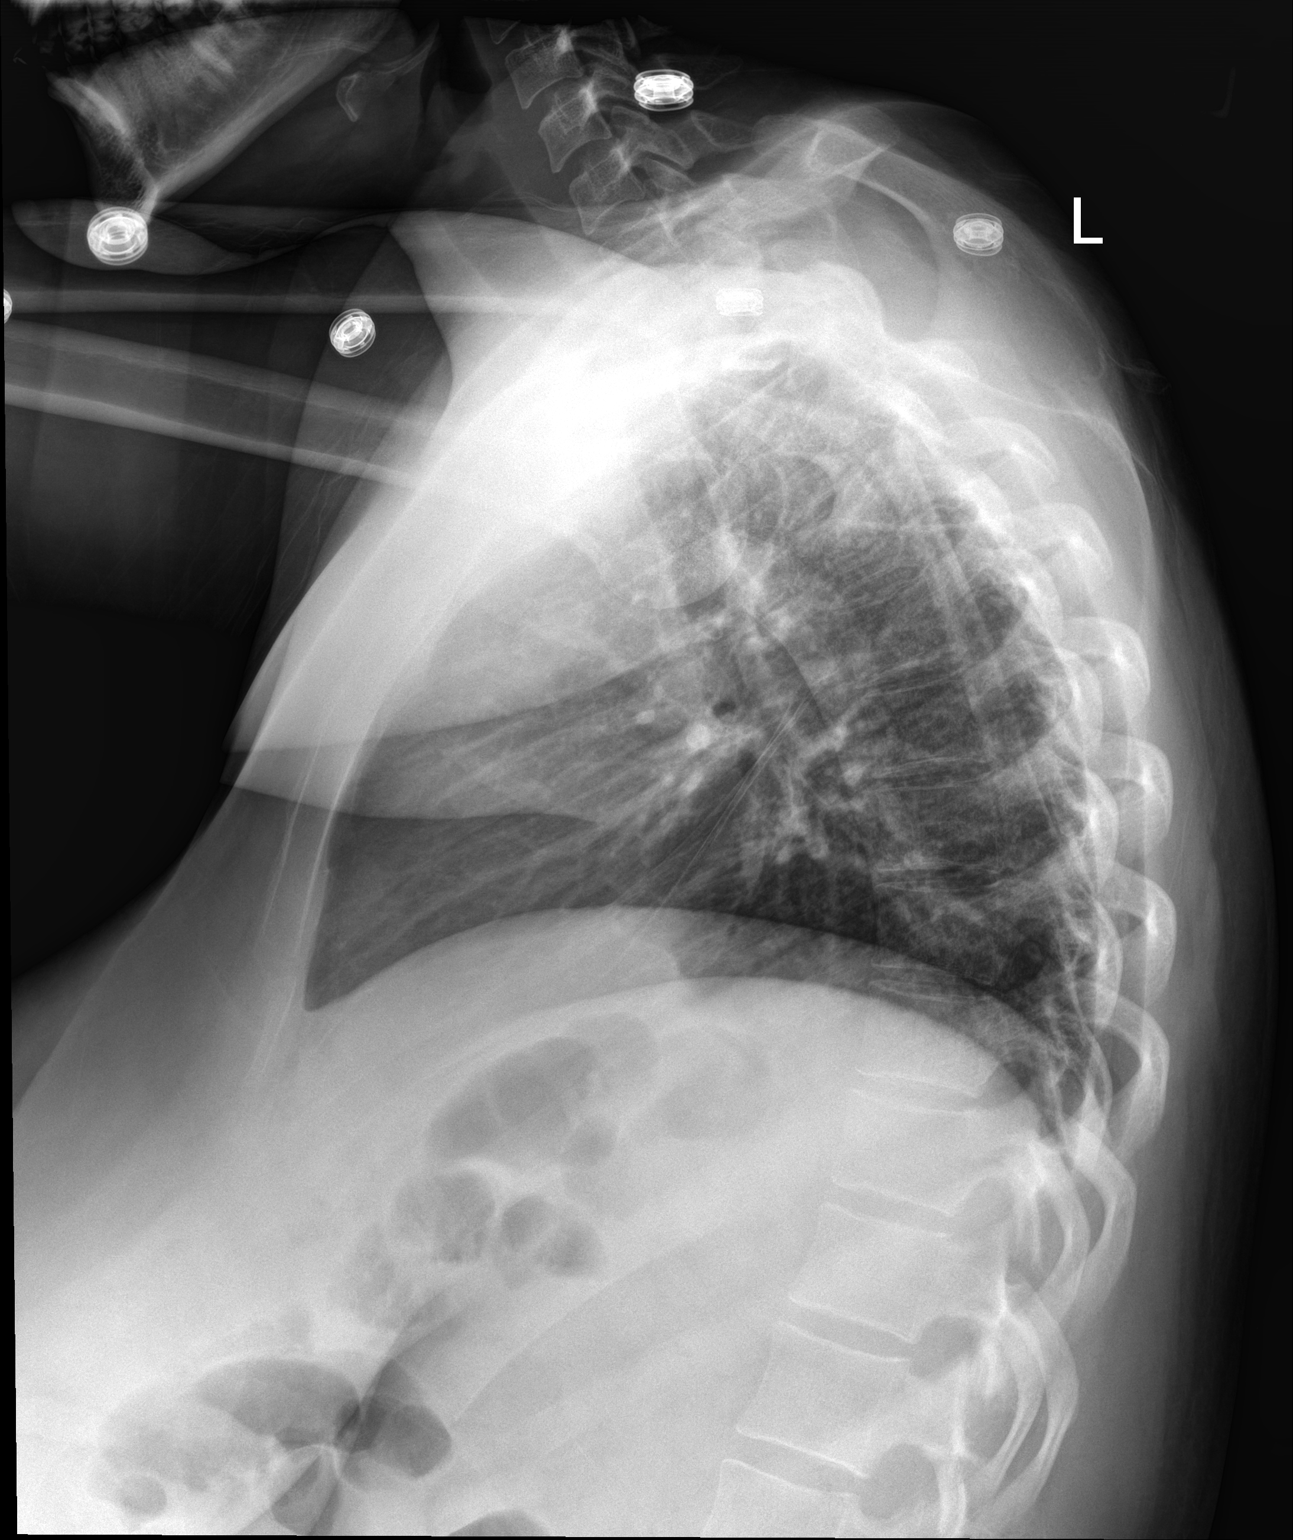

[2 of 2 positions shown; findings below may reference images not displayed]

FINDINGS: Normal cardiac mediastinal contours. Low lung volumes. No
consolidative pulmonary opacities. No pleural effusion or
pneumothorax. Regional skeleton is unremarkable.
IMPRESSION: No active cardiopulmonary disease.

## 2019-01-04 ENCOUNTER — Telehealth: Payer: Self-pay | Admitting: Nurse Practitioner

## 2019-01-04 ENCOUNTER — Telehealth: Payer: Self-pay

## 2019-01-04 NOTE — Telephone Encounter (Signed)
Patient called because she was not getting her menses for 8 months now and wanted to know if that was okay. Advised that is sometimes normal In patients who have IUD's and she verbalized understanding and had no questions.

## 2019-01-04 NOTE — Telephone Encounter (Signed)
Patient called to ask about her IUD, and not having a cycle. She wants the clinical staff to call her back.

## 2019-06-21 ENCOUNTER — Ambulatory Visit: Payer: Medicaid Other | Admitting: Advanced Practice Midwife

## 2019-08-05 ENCOUNTER — Ambulatory Visit
Admission: RE | Admit: 2019-08-05 | Discharge: 2019-08-05 | Disposition: A | Discharge status: Home / Self Care | Payer: No Typology Code available for payment source | Source: Ambulatory Visit | Attending: Obstetrics and Gynecology | Admitting: Obstetrics and Gynecology

## 2019-08-05 ENCOUNTER — Ambulatory Visit: Payer: Self-pay | Admitting: *Deleted

## 2019-08-05 ENCOUNTER — Telehealth: Payer: Self-pay | Admitting: *Deleted

## 2019-08-05 ENCOUNTER — Other Ambulatory Visit: Payer: Self-pay

## 2019-08-05 ENCOUNTER — Other Ambulatory Visit: Payer: Self-pay | Admitting: Obstetrics and Gynecology

## 2019-08-05 ENCOUNTER — Other Ambulatory Visit: Payer: Self-pay | Admitting: Obstetrics & Gynecology

## 2019-08-05 VITALS — BP 129/77 | Temp 97.3°F | Wt 254.0 lb

## 2019-08-05 DIAGNOSIS — N63 Unspecified lump in unspecified breast: Secondary | ICD-10-CM

## 2019-08-05 DIAGNOSIS — N611 Abscess of the breast and nipple: Secondary | ICD-10-CM

## 2019-08-05 DIAGNOSIS — R2231 Localized swelling, mass and lump, right upper limb: Secondary | ICD-10-CM

## 2019-08-05 DIAGNOSIS — R2232 Localized swelling, mass and lump, left upper limb: Secondary | ICD-10-CM

## 2019-08-05 DIAGNOSIS — Z1239 Encounter for other screening for malignant neoplasm of breast: Secondary | ICD-10-CM

## 2019-08-05 MED ORDER — SULFAMETHOXAZOLE-TRIMETHOPRIM 800-160 MG PO TABS: 1.0000 | ORAL_TABLET | Freq: Two times a day (BID) | ORAL | 0 refills | Status: AC

## 2019-08-05 NOTE — Progress Notes (Signed)
Bactrim DS x 10 days called in for patient for treatment of breast abscess as recommended by [facility].   Jaynie Collins, MD

## 2019-08-05 NOTE — Patient Instructions (Signed)
Explained breast self awareness with Morgan Mathews. Patient did not need a Pap smear today due to last Pap smear was 07/29/2019. Let her know BCCCP will cover Pap smears every 3 years unless has a history of abnormal Pap smears. Referred patient to the [facility] for a bilateral breast ultrasound. Appointment scheduled Thursday, August 05, 2019 at 1230. Patient aware of appointment and will be there. Morgan Mathews verbalized understanding.  Morgan Mathews, Kathaleen Maser, RN 10:51 AM

## 2019-08-05 NOTE — Telephone Encounter (Signed)
Dr. Earlene Plater from the [facility] recommended an additional round of antibiotics. Order sent for Bactrim to patients pharmacy. Called patient and let her know. Verified pharmacy with patient. Patient requested a work note. Note has been written and told patient she can pick up in the morning at the [facility]. Patient verbalized understanding.

## 2019-08-05 NOTE — Progress Notes (Signed)
Ms. Morgan Mathews is a 29 y.o. female who presents to [facility] clinic today with complaint of right breast abscess x 1.5 months that was hot and painful. Patient stated the pain was constant and she rated at a 10 out of 10. Patient stated she was given Bactrim when she went the [facility] on 07/29/2019. Patient stated the pain resolved 1-2 days after started antibiotic.    Pap Smear: Pap not smear completed today. Last Pap smear was 07/29/2019 at the [facility] clinic and was normal. Per patient has no history of an abnormal Pap smear. Last Pap smear result is not available in Epic. Last Pap smear result will be scanned into Epic.   Physical exam: Breasts Right breast larger than left breast that per patient noticed the change a few months ago. No skin abnormalities bilateral breasts. No nipple retraction bilateral breasts. No nipple discharge bilateral breasts. Bilateral axillary lymphadenopathy. Palpated a right axillary lump at 11 o'clock 21 cm from the nipple. Palpated a left axillary lump at 2 o'clock 20 cm from the nipple. Complaints of pain when palpated right axillary lump on exam.   Pelvic/Bimanual Pap is not indicated today per BCCCP guidelines.   Smoking History: Patient has never smoked.   Patient Navigation: Patient education provided. Access to services provided for patient through [facility] program. Transportation provided to appointment at [facility].    Breast and Cervical Cancer Risk Assessment: Patient does not have family history of breast cancer, known genetic mutations, or radiation treatment to the chest before age 74. Patient does not have history of cervical dysplasia, immunocompromised, or DES exposure in-utero. Breast cancer risk assessment completed. No breast cancer risk calculated due to patient is less than 13 years old.  A: BCCCP exam without pap smear  P: Referred patient to the [facility] for a  bilateral breast ultrasound. Appointment scheduled Thursday, August 05, 2019 at 1230.  Priscille Heidelberg, RN 08/05/2019 10:51 AM

## 2019-08-16 ENCOUNTER — Other Ambulatory Visit: Payer: Self-pay | Admitting: Obstetrics and Gynecology

## 2019-08-16 ENCOUNTER — Other Ambulatory Visit: Payer: Self-pay

## 2019-08-16 ENCOUNTER — Other Ambulatory Visit: Payer: No Typology Code available for payment source

## 2019-08-16 ENCOUNTER — Ambulatory Visit
Admission: RE | Admit: 2019-08-16 | Discharge: 2019-08-16 | Disposition: A | Discharge status: Home / Self Care | Payer: No Typology Code available for payment source | Source: Ambulatory Visit | Attending: Obstetrics and Gynecology | Admitting: Obstetrics and Gynecology

## 2019-08-16 DIAGNOSIS — N63 Unspecified lump in unspecified breast: Secondary | ICD-10-CM

## 2020-01-20 ENCOUNTER — Encounter (HOSPITAL_COMMUNITY): Payer: Self-pay | Admitting: Emergency Medicine

## 2020-01-20 ENCOUNTER — Other Ambulatory Visit: Payer: Self-pay

## 2020-01-20 ENCOUNTER — Ambulatory Visit (HOSPITAL_COMMUNITY)
Admission: EM | Admit: 2020-01-20 | Discharge: 2020-01-20 | Disposition: A | Discharge status: Home / Self Care | Payer: HRSA Program | Attending: Student | Admitting: Student

## 2020-01-20 DIAGNOSIS — R0789 Other chest pain: Secondary | ICD-10-CM | POA: Insufficient documentation

## 2020-01-20 DIAGNOSIS — U071 COVID-19: Secondary | ICD-10-CM | POA: Insufficient documentation

## 2020-01-20 DIAGNOSIS — R059 Cough, unspecified: Secondary | ICD-10-CM | POA: Diagnosis present

## 2020-01-20 DIAGNOSIS — J069 Acute upper respiratory infection, unspecified: Secondary | ICD-10-CM

## 2020-01-20 LAB — RESP PANEL BY RT-PCR (FLU A&B, COVID) ARPGX2
Influenza A by PCR: NEGATIVE
Influenza B by PCR: NEGATIVE
SARS Coronavirus 2 by RT PCR: POSITIVE — AB

## 2020-01-20 MED ORDER — PROMETHAZINE-DM 6.25-15 MG/5ML PO SYRP: 5.0000 mL | ORAL_SOLUTION | Freq: Four times a day (QID) | ORAL | 0 refills | Status: DC | PRN

## 2020-01-20 NOTE — ED Triage Notes (Signed)
Pt c/o cough, heachache, congestion, fever.

## 2020-01-20 NOTE — ED Provider Notes (Signed)
[facility]    CSN: [id number] Arrival date & time: 01/20/20  1107      History   Chief Complaint Chief Complaint  Patient presents with  . Fever    HPI Morgan Mathews is a 29 y.o. female presenting with cough, headache, congestion, and fever. History of thoracic back pain, strabismus, gestational diabetes. Endorses chest wall pain when coughing. Denies chest pain at rest, pain down either arm/jaw. Denies n/v/d, shortness of breath, chest pain, facial pain, teeth pain, sore throat, loss of taste/smell, swollen lymph nodes, ear pain. Fully vaccinated for covid-19.   HPI  Past Medical History:  Diagnosis Date  . Chronic bilateral thoracic back pain 08/28/2016  . Gestational diabetes   . 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 IUD (intrauterine device) in place 01/01/2018  . BMI 30.0-30.9,adult 12/03/2017    Past Surgical History:  Procedure Laterality Date  . NO PAST SURGERIES      OB History    Gravida  3   Para  3   Term  3   Preterm  0   AB  0   Living  3     SAB  0   IAB  0   Ectopic  0   Multiple  0   Live Births  3            Home Medications    Prior to Admission medications   Medication Sig Start Date End Date Taking? Authorizing Provider  promethazine-dextromethorphan (PROMETHAZINE-DM) 6.25-15 MG/5ML syrup Take 5 mLs by mouth 4 (four) times daily as needed for cough. 01/20/20   Rhys Martini, PA-C    Family History History reviewed. No pertinent family history.  Social History Social History   Tobacco Use  . Smoking status: Never Smoker  . Smokeless tobacco: Never Used  Vaping Use  . Vaping Use: Never used  Substance Use Topics  . Alcohol use: No  . Drug use: No     Allergies   Patient has no known allergies.   Review of Systems Review of Systems  Constitutional: Positive for chills and fever.  HENT: Positive for congestion.   Respiratory:  Positive for cough.   All other systems reviewed and are negative.    Physical Exam Triage Vital Signs ED Triage Vitals  Enc Vitals Group     BP 01/20/20 1231 128/66     Pulse Rate 01/20/20 1231 84     Resp 01/20/20 1231 20     Temp 01/20/20 1231 99 F (37.2 C)     Temp Source 01/20/20 1231 Oral     SpO2 01/20/20 1231 100 %     Weight --      Height --      Head Circumference --      Peak Flow --      Pain Score 01/20/20 1232 9     Pain Loc --      Pain Edu? --      Excl. in GC? --    No data found.  Updated Vital Signs BP 128/66 (BP Location: Right Arm)   Pulse 84   Temp 99 F (37.2 C) (Oral)   Resp 20   LMP 01/17/2020   SpO2 100%   Visual Acuity Right Eye Distance:   Left Eye Distance:   Bilateral Distance:    Right Eye Near:   Left Eye Near:    Bilateral  Near:     Physical Exam Vitals reviewed.  Constitutional:      General: She is not in acute distress.    Appearance: Normal appearance. She is well-developed. She is not ill-appearing.  HENT:     Head: Normocephalic and atraumatic.     Right Ear: Hearing, tympanic membrane, ear canal and external ear normal. No drainage. No middle ear effusion. Tympanic membrane is not perforated, erythematous, retracted or bulging.     Left Ear: Hearing, tympanic membrane, ear canal and external ear normal. No drainage.  No middle ear effusion. Tympanic membrane is not perforated, erythematous, retracted or bulging.     Nose: Nose normal.     Right Sinus: No maxillary sinus tenderness or frontal sinus tenderness.     Left Sinus: No maxillary sinus tenderness or frontal sinus tenderness.     Mouth/Throat:     Mouth: Mucous membranes are moist.     Pharynx: Uvula midline. No oropharyngeal exudate, posterior oropharyngeal erythema or uvula swelling.     Tonsils: No tonsillar exudate.  Cardiovascular:     Rate and Rhythm: Normal rate and regular rhythm.     Heart sounds: Normal heart sounds.  Pulmonary:     Effort:  Pulmonary effort is normal.     Breath sounds: Normal breath sounds and air entry. No wheezing, rhonchi or rales.     Comments: Occ cough Chest:     Chest wall: Tenderness present.     Comments: Chest wall tenderness to palpation along right sternal border Abdominal:     General: Bowel sounds are normal.     Palpations: Abdomen is soft.     Tenderness: There is no abdominal tenderness. There is no guarding or rebound. Negative signs include Murphy's sign and McBurney's sign.  Lymphadenopathy:     Cervical: No cervical adenopathy.  Neurological:     General: No focal deficit present.     Mental Status: She is alert.  Psychiatric:        Attention and Perception: Attention and perception normal.        Mood and Affect: Mood and affect normal.        Behavior: Behavior is cooperative.      UC Treatments / Results  Labs (all labs ordered are listed, but only abnormal results are displayed) Labs Reviewed - No data to display  EKG   Radiology No results found.  Procedures Procedures (including critical care time)  Medications Ordered in UC Medications - No data to display  Initial Impression / Assessment and Plan / UC Course  I have reviewed the triage vital signs and the nursing notes.  Pertinent labs & imaging results that were available during my care of the patient were reviewed by me and considered in my medical decision making (see chart for details).       Covid and influenza tests sent today. Patient is fully vaccinated for covid-19. Isolation precautions per CDC guidelines until negative result. Symptomatic relief with OTC Mucinex, Nyquil, etc. Promethazine dm sent as below. Return precautions- new/worsening fevers/chills, shortness of breath, chest pain, abd pain, etc.   Final Clinical Impressions(s) / UC Diagnoses   Final diagnoses:  Acute upper respiratory infection     Discharge Instructions     Use Tylenol and ibuprofen for your headache, chest wall  pain, fevers/chills. You can try the prescription cough syrup (Promethazine DM) for your cough and congestion. We'll call you if the result of your covid/influenza test is positive.   Seek  additional medical attention if- chest pain, shortness of breath, worsening fevers/chills, confusion, worsening of symptoms despite the above treatment plan, etc.      ED Prescriptions    Medication Sig Dispense Auth. Provider   promethazine-dextromethorphan (PROMETHAZINE-DM) 6.25-15 MG/5ML syrup Take 5 mLs by mouth 4 (four) times daily as needed for cough. 118 mL Rhys Martini, PA-C     PDMP not reviewed this encounter.   Rhys Martini, PA-C 01/20/20 1331

## 2020-01-20 NOTE — Discharge Instructions (Addendum)
Use Tylenol and ibuprofen for your headache, chest wall pain, fevers/chills. You can try the prescription cough syrup (Promethazine DM) for your cough and congestion. We'll call you if the result of your covid/influenza test is positive.   Seek additional medical attention if- chest pain, shortness of breath, worsening fevers/chills, confusion, worsening of symptoms despite the above treatment plan, etc.

## 2020-01-29 NOTE — L&D Delivery Note (Signed)
[facility]N [facility] Delivery Note  Tiesha Desautel is a 30 y.o. [id number] s/p VD at [redacted]w[redacted]d. She was admitted for SOL, known A2GDM and LGA infant.   ROM: 7h 67m with clear fluid GBS Status:  Positive/-- (10/05 1110) Maximum Maternal Temperature: 98.20F  Labor Progress: Initial SVE: 5/100. She then progressed to complete with assistance of AROM and pit.    Delivery Date/Time: 10/14, 2147 Delivery: Present already in the room, patient was complete and pushing. Head delivered L OA. No nuchal cord present. Encountered mild shoulder dystocia that lasted approximately 20-30 seconds requiring downward traction, suprapubic pressure, and McRoberts. Body delivered in usual fashion. Infant with poor tone, placed on mother's abdomen, dried and stimulated. Cord clamped immediately and cut by provider. Taken for evaluation by NICU team. Cord blood drawn. ABG collected, pH 7.3. Placenta delivered spontaneously with gentle cord traction. Fundus firm with massage and Pitocin. Labia, perineum, vagina, and cervix inspected with no lacerations. Infant placed in skin to skin with FOB after evaluation.  Baby Weight: pending  Placenta: 3 vessel, intact. Sent to L&D Complications: Shoulder dystocia  Lacerations: None EBL: 125 mL Analgesia: Epidural   Infant:  APGAR (1 MIN): 7 APGAR (5 MINS): 9  Leticia Penna, DO  OB Family Medicine Fellow, [facility] for [facility], [facility] 11/10/2020, 10:18 PM

## 2020-04-13 ENCOUNTER — Telehealth: Payer: No Typology Code available for payment source

## 2020-04-18 ENCOUNTER — Telehealth (INDEPENDENT_AMBULATORY_CARE_PROVIDER_SITE_OTHER): Payer: No Typology Code available for payment source

## 2020-04-18 DIAGNOSIS — O09899 Supervision of other high risk pregnancies, unspecified trimester: Secondary | ICD-10-CM | POA: Insufficient documentation

## 2020-04-18 DIAGNOSIS — Z3480 Encounter for supervision of other normal pregnancy, unspecified trimester: Secondary | ICD-10-CM | POA: Insufficient documentation

## 2020-04-18 DIAGNOSIS — R112 Nausea with vomiting, unspecified: Secondary | ICD-10-CM

## 2020-04-18 MED ORDER — PROMETHAZINE HCL 25 MG PO TABS: 25.0000 mg | ORAL_TABLET | Freq: Four times a day (QID) | ORAL | 1 refills | Status: DC | PRN

## 2020-04-18 NOTE — Progress Notes (Signed)
Pt states has been vomiting and feeling nauseated. Sent Rx for Phenergan 25mg  to Pharmacy on file.

## 2020-04-18 NOTE — Progress Notes (Signed)
New OB Intake  I connected with  Morgan Mathews on 04/18/20 at 11:15 AM EDT by MyChart and verified that I am speaking with the correct person using two identifiers. Nurse is located at [facility] and pt is located at home .  I discussed the limitations, risks, security and privacy concerns of performing an evaluation and management service by telephone and the availability of in person appointments. I also discussed with the patient that there may be a patient responsible charge related to this service. The patient expressed understanding and agreed to proceed.  I explained I am completing New OB Intake today. We discussed her EDD of 11/17/20 that is based on LMP of 02/11/20 . Pt is G 4/P 3  . I reviewed her allergies, medications, Medical/Surgical/OB history, and appropriate screenings. I informed her of [facility] services. Based on history, this is a/an uncomplicated pregnancy.  Patient Active Problem List   Diagnosis Date Noted  . IUD (intrauterine device) in place 01/01/2018  . BMI 30.0-30.9,adult 12/03/2017     Concerns addressed today  Delivery Plans:  Plans to deliver at [facility].   MyChart/Babyscripts MyChart access verified. I explained pt will have some visits in office and some virtually. Babyscripts instructions given. Account successfully created and app downloaded.  Blood Pressure Cuff Blood pressure cuff ordered for patient to pick-up from [employer]. Explained after first prenatal appt pt will check weekly and document in Babyscripts.  Anatomy US Explained first scheduled [place] will be around 19 weeks. Anatomy US scheduled for 06/23/20@ 10:30a.   Labs Discussed Avelina Laine genetic screening with patient. Would like both Panorama and Horizon drawn at new OB visit. Routine prenatal labs needed.  Covid Vaccine Patient has had covid vaccine.   [facility] Referral Patient is interested in referral to [facility].    First visit review I reviewed new OB appt with pt. I explained she will  have a pelvic exam, ob bloodwork with genetic screening, and PAP smear. Explained pt will be seen by Gaetana Michaelis ON 04/27/20 @ 9:55A at first visit; encounter routed to appropriate provider. If new patient offered monthly Zoom meeting  Morgan Mathews, [facility] 04/18/2020  11:28 AM

## 2020-04-27 ENCOUNTER — Other Ambulatory Visit: Payer: No Typology Code available for payment source

## 2020-04-27 ENCOUNTER — Other Ambulatory Visit: Payer: Self-pay

## 2020-04-27 ENCOUNTER — Other Ambulatory Visit (HOSPITAL_COMMUNITY)
Admission: RE | Admit: 2020-04-27 | Discharge: 2020-04-27 | Disposition: A | Discharge status: Home / Self Care | Payer: No Typology Code available for payment source | Source: Ambulatory Visit | Attending: Nurse Practitioner | Admitting: Nurse Practitioner

## 2020-04-27 ENCOUNTER — Ambulatory Visit (INDEPENDENT_AMBULATORY_CARE_PROVIDER_SITE_OTHER): Payer: Self-pay | Admitting: Nurse Practitioner

## 2020-04-27 ENCOUNTER — Encounter: Payer: Self-pay | Admitting: Nurse Practitioner

## 2020-04-27 VITALS — BP 118/80 | HR 87 | Wt 236.8 lb

## 2020-04-27 DIAGNOSIS — Z3480 Encounter for supervision of other normal pregnancy, unspecified trimester: Secondary | ICD-10-CM | POA: Insufficient documentation

## 2020-04-27 DIAGNOSIS — Z6830 Body mass index (BMI) 30.0-30.9, adult: Secondary | ICD-10-CM

## 2020-04-27 DIAGNOSIS — Z8632 Personal history of gestational diabetes: Secondary | ICD-10-CM

## 2020-04-27 DIAGNOSIS — Z136 Encounter for screening for cardiovascular disorders: Secondary | ICD-10-CM

## 2020-04-27 DIAGNOSIS — Z3A10 10 weeks gestation of pregnancy: Secondary | ICD-10-CM

## 2020-04-27 DIAGNOSIS — O219 Vomiting of pregnancy, unspecified: Secondary | ICD-10-CM

## 2020-04-27 DIAGNOSIS — O09299 Supervision of pregnancy with other poor reproductive or obstetric history, unspecified trimester: Secondary | ICD-10-CM

## 2020-04-27 DIAGNOSIS — Z23 Encounter for immunization: Secondary | ICD-10-CM

## 2020-04-27 MED ORDER — BLOOD PRESSURE MONITORING DEVI: 1.0000 | 0 refills | Status: DC

## 2020-04-27 MED ORDER — DOXYLAMINE-PYRIDOXINE 10-10 MG PO TBEC: DELAYED_RELEASE_TABLET | ORAL | 2 refills | Status: DC

## 2020-04-27 NOTE — Progress Notes (Signed)
Subjective:   Morgan Mathews is a 30 y.o. G4P3003 at [redacted]w[redacted]d by LMP being seen today for her first obstetrical visit.  Her obstetrical history is significant for Hx of gestational diabetes - diet controlled, Nausea and vomiting. She had her IUD removed several months ago and switched to OCPs.  She was not happy with the amount of vaginal discharge with the IUD.  She ran out before her visit to have pills refilled and took Plan B which did not work for her.  Patient does intend to breast feed. Pregnancy history fully reviewed.  Patient reports nausea and vomiting.  HISTORY: OB History  Gravida Para Term Preterm AB Living  4 3 3  0 0 3  SAB IAB Ectopic Multiple Live Births  0 0 0 0 3    # Outcome Date GA Lbr Len/2nd Weight Sex Delivery Anes PTL Lv  4 Current           3 Term 10/30/17 [redacted]w[redacted]d  8 lb 9 oz (3.884 kg) F Vag-Spont EPI  LIV     Name: Mellette,GIRL Mechele     Apgar1: 9  Apgar5: 9  2 Term 02/12/16 [redacted]w[redacted]d 05:28 / 00:03 8 lb 3.4 oz (3.725 kg) M Vag-Vacuum None  LIV     Name: Leyendecker,BOY Moneisha     Apgar1: 2  Apgar5: 7  1 Term 03/19/14 [redacted]w[redacted]d 20:11 / 02:38 8 lb 12.6 oz (3.985 kg) M Vag-Vacuum Local  LIV     Apgar1: 4  Apgar5: 8   Past Medical History:  Diagnosis Date  . Chronic bilateral thoracic back pain 08/28/2016  . Gestational diabetes   . 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 NO PAST SURGERIES     History reviewed. No pertinent family history. Social History   Tobacco Use  . Smoking status: Never Smoker  . Smokeless tobacco: Never Used  Vaping Use  . Vaping Use: Never used  Substance Use Topics  . Alcohol use: No  . Drug use: No   No Known Allergies Current Outpatient Medications on File Prior to Visit  Medication Sig Dispense Refill  . prenatal vitamin w/FE, FA (PRENATAL 1 + 1) 27-1 MG TABS tablet Take 1 tablet by mouth daily at 12 noon.    . promethazine (PHENERGAN) 25 MG tablet Take 1 tablet (25 mg  total) by mouth every 6 (six) hours as needed for nausea or vomiting. (Patient not taking: Reported on 04/27/2020) 30 tablet 1  . promethazine-dextromethorphan (PROMETHAZINE-DM) 6.25-15 MG/5ML syrup Take 5 mLs by mouth 4 (four) times daily as needed for cough. (Patient not taking: Reported on 04/27/2020) 118 mL 0   No current facility-administered medications on file prior to visit.     Exam   Vitals:   04/27/20 1020  BP: 118/80  Pulse: 87  Weight: 236 lb 12.8 oz (107.4 kg)   Fetal Heart Rate (bpm): 166  Uterus:     Pelvic Exam: Perineum:  pelvic deferred -    Vulva:    Vagina:     Cervix:    Adnexa:    Bony Pelvis:   System: General: well-developed, well-nourished female in no acute distress   Breast:  deferred   Skin: normal coloration and turgor, no rashes   Neurologic: oriented, normal, negative, normal mood   Extremities: normal strength, tone, and muscle mass, ROM of all joints is normal   HEENT extraocular movement intact and sclera clear, anicteric  Mouth/Teeth  deferred   Neck supple and no masses, normal thyroid   Cardiovascular: regular rate and rhythm   Respiratory:  no respiratory distress, normal breath sounds   Abdomen: soft, non-tender; no masses,  no organomegaly     Assessment:   Pregnancy: [id number] Patient Active Problem List   Diagnosis Date Noted  . Supervision of other normal pregnancy, antepartum 04/18/2020  . BMI 30.0-30.9,adult 12/03/2017     Plan:  1. Supervision of other normal pregnancy, antepartum Reviewed how to get babyscripts on her phone Advised 10-20 pound weight gain in pregnancy Pap not due at this visit - due in 2024  - CBC/D/Plt+RPR+Rh+ABO+Rub Ab... - Genetic Screening - GC/Chlamydia probe amp ([place])not at [facility] - Culture, OB Urine - CHL AMB BABYSCRIPTS SCHEDULE OPTIMIZATION - Hemoglobin A1c - Blood Pressure Monitoring DEVI; 1 each by Does not apply route once a week.  Dispense: 1 each; Refill: 0  2. Screening for  hypertension  - Blood Pressure Monitoring DEVI; 1 each by Does not apply route once a week.  Dispense: 1 each; Refill: 0  3.  Nausea and vomiting Ordered diclegis and reviewed how to take diclegis  4.  Hx of gestational diabetes HA1C done today Reviewed diet for gestational diabetes and advised using that same eating plan now.  Initial labs drawn. Continue prenatal vitamins. Genetic Screening discussed, NIPS: ordered. Ultrasound discussed; fetal anatomic survey: will be ordered at next visit. Problem list reviewed and updated. The nature of [facility] with multiple MDs and other Advanced Practice Providers was explained to patient; also emphasized that residents, students are part of our team. Routine obstetric precautions reviewed. Return in about 4 weeks (around 05/25/2020) for in person ROB.  Total face-to-face time with patient: 40 minutes.  Over 50% of encounter was spent on counseling and coordination of care.     Nolene Bernheim, FNP Family Nurse Practitioner, [facility] for [employer], [facility] 04/27/2020 11:45 AM

## 2020-04-27 NOTE — Addendum Note (Signed)
Addended by: Henrietta Dine on: 04/27/2020 04:39 PM   Modules accepted: Orders

## 2020-04-28 LAB — CBC/D/PLT+RPR+RH+ABO+RUB AB...
Antibody Screen: NEGATIVE
Basophils Absolute: 0 10*3/uL (ref 0.0–0.2)
Basos: 0 %
EOS (ABSOLUTE): 0.1 10*3/uL (ref 0.0–0.4)
Eos: 2 %
HCV Ab: <0.1 s/co ratio (ref 0.0–0.9)
HIV Screen 4th Generation wRfx: NONREACTIVE
Hematocrit: 36.6 % (ref 34.0–46.6)
Hemoglobin: 12.3 g/dL (ref 11.1–15.9)
Hepatitis B Surface Ag: NEGATIVE
Immature Grans (Abs): 0 10*3/uL (ref 0.0–0.1)
Immature Granulocytes: 0 %
Lymphocytes Absolute: 2 10*3/uL (ref 0.7–3.1)
Lymphs: 36 %
MCH: 32.5 pg (ref 26.6–33.0)
MCHC: 33.6 g/dL (ref 31.5–35.7)
MCV: 97 fL (ref 79–97)
Monocytes Absolute: 0.4 10*3/uL (ref 0.1–0.9)
Monocytes: 7 %
Neutrophils Absolute: 3.1 10*3/uL (ref 1.4–7.0)
Neutrophils: 55 %
Platelets: 221 10*3/uL (ref 150–450)
RBC: 3.78 x10E6/uL (ref 3.77–5.28)
RDW: 12.1 % (ref 11.7–15.4)
RPR Ser Ql: NONREACTIVE
Rh Factor: POSITIVE
Rubella Antibodies, IGG: 17.8 index (ref >0.99)
WBC: 5.7 10*3/uL (ref 3.4–10.8)

## 2020-04-28 LAB — HEMOGLOBIN A1C
Est. average glucose Bld gHb Est-mCnc: 80 mg/dL
Hgb A1c MFr Bld: 4.4 % — ABNORMAL LOW (ref 4.8–5.6)

## 2020-04-28 LAB — HCV INTERPRETATION

## 2020-04-29 LAB — CULTURE, OB URINE

## 2020-04-29 LAB — URINE CULTURE, OB REFLEX

## 2020-04-30 LAB — CERVICOVAGINAL ANCILLARY ONLY
Chlamydia: NEGATIVE
Comment: NEGATIVE
Comment: NORMAL
Neisseria Gonorrhea: NEGATIVE

## 2020-05-01 ENCOUNTER — Encounter: Payer: Self-pay | Admitting: Lactation Services

## 2020-05-01 ENCOUNTER — Telehealth: Payer: Self-pay | Admitting: *Deleted

## 2020-05-01 NOTE — Telephone Encounter (Signed)
Pt left VM message stating that she has a sore throat and is requesting a prescription.

## 2020-05-01 NOTE — Telephone Encounter (Signed)
Returned patients call. Patient reports she has itchy eyes and is sneezing a lot for about a month. She reports she has been taking medication from the Pharmacy Reviewed OTC medications that can be taken while pregnant and discussed asking Pharmacist for an allergy eye drop.  Patient asking about Avelina Laine results, reviewed with her that they are not back yet and should be back next week. Patient voiced understanding.   Will send list of OTC meds via My Chart at patients request.

## 2020-05-08 ENCOUNTER — Encounter: Payer: Self-pay | Admitting: *Deleted

## 2020-05-08 DIAGNOSIS — Z3480 Encounter for supervision of other normal pregnancy, unspecified trimester: Secondary | ICD-10-CM | POA: Diagnosis not present

## 2020-05-24 ENCOUNTER — Other Ambulatory Visit: Payer: Self-pay | Admitting: *Deleted

## 2020-05-24 DIAGNOSIS — O09299 Supervision of pregnancy with other poor reproductive or obstetric history, unspecified trimester: Secondary | ICD-10-CM

## 2020-05-26 ENCOUNTER — Encounter: Payer: Self-pay | Admitting: Family Medicine

## 2020-05-26 ENCOUNTER — Other Ambulatory Visit: Payer: Self-pay

## 2020-05-26 ENCOUNTER — Other Ambulatory Visit: Payer: No Typology Code available for payment source

## 2020-05-26 ENCOUNTER — Ambulatory Visit (INDEPENDENT_AMBULATORY_CARE_PROVIDER_SITE_OTHER): Payer: Self-pay | Admitting: Family Medicine

## 2020-05-26 VITALS — BP 107/72 | HR 71 | Wt 239.0 lb

## 2020-05-26 DIAGNOSIS — Z6830 Body mass index (BMI) 30.0-30.9, adult: Secondary | ICD-10-CM

## 2020-05-26 DIAGNOSIS — Z3480 Encounter for supervision of other normal pregnancy, unspecified trimester: Secondary | ICD-10-CM

## 2020-05-26 DIAGNOSIS — O09299 Supervision of pregnancy with other poor reproductive or obstetric history, unspecified trimester: Secondary | ICD-10-CM | POA: Diagnosis not present

## 2020-05-26 DIAGNOSIS — Z8632 Personal history of gestational diabetes: Secondary | ICD-10-CM | POA: Diagnosis not present

## 2020-05-26 NOTE — Patient Instructions (Signed)
 Contraception Choices Contraception, also called birth control, refers to methods or devices that prevent pregnancy. Hormonal methods Contraceptive implant A contraceptive implant is a thin, plastic tube that contains a hormone that prevents pregnancy. It is different from an intrauterine device (IUD). It is inserted into the upper part of the arm by a health care provider. Implants can be effective for up to 3 years. Progestin-only injections Progestin-only injections are injections of progestin, a synthetic form of the hormone progesterone. They are given every 3 months by a health care provider. Birth control pills Birth control pills are pills that contain hormones that prevent pregnancy. They must be taken once a day, preferably at the same time each day. A prescription is needed to use this method of contraception. Birth control patch The birth control patch contains hormones that prevent pregnancy. It is placed on the skin and must be changed once a week for three weeks and removed on the fourth week. A prescription is needed to use this method of contraception. Vaginal ring A vaginal ring contains hormones that prevent pregnancy. It is placed in the vagina for three weeks and removed on the fourth week. After that, the process is repeated with a new ring. A prescription is needed to use this method of contraception. Emergency contraceptive Emergency contraceptives prevent pregnancy after unprotected sex. They come in pill form and can be taken up to 5 days after sex. They work best the sooner they are taken after having sex. Most emergency contraceptives are available without a prescription. This method should not be used as your only form of birth control.   Barrier methods Female condom A female condom is a thin sheath that is worn over the penis during sex. Condoms keep sperm from going inside a woman's body. They can be used with a sperm-killing substance (spermicide) to increase their  effectiveness. They should be thrown away after one use. Female condom A female condom is a soft, loose-fitting sheath that is put into the vagina before sex. The condom keeps sperm from going inside a woman's body. They should be thrown away after one use. Diaphragm A diaphragm is a soft, dome-shaped barrier. It is inserted into the vagina before sex, along with a spermicide. The diaphragm blocks sperm from entering the uterus, and the spermicide kills sperm. A diaphragm should be left in the vagina for 6-8 hours after sex and removed within 24 hours. A diaphragm is prescribed and fitted by a health care provider. A diaphragm should be replaced every 1-2 years, after giving birth, after gaining more than 15 lb (6.8 kg), and after pelvic surgery. Cervical cap A cervical cap is a round, soft latex or plastic cup that fits over the cervix. It is inserted into the vagina before sex, along with spermicide. It blocks sperm from entering the uterus. The cap should be left in place for 6-8 hours after sex and removed within 48 hours. A cervical cap must be prescribed and fitted by a health care provider. It should be replaced every 2 years. Sponge A sponge is a soft, circular piece of polyurethane foam with spermicide in it. The sponge helps block sperm from entering the uterus, and the spermicide kills sperm. To use it, you make it wet and then insert it into the vagina. It should be inserted before sex, left in for at least 6 hours after sex, and removed and thrown away within 30 hours. Spermicides Spermicides are chemicals that kill or block sperm from entering the   cervix and uterus. They can come as a cream, jelly, suppository, foam, or tablet. A spermicide should be inserted into the vagina with an applicator at least 10-15 minutes before sex to allow time for it to work. The process must be repeated every time you have sex. Spermicides do not require a prescription.   Intrauterine  contraception Intrauterine device (IUD) An IUD is a T-shaped device that is put in a woman's uterus. There are two types:  Hormone IUD.This type contains progestin, a synthetic form of the hormone progesterone. This type can stay in place for 3-5 years.  Copper IUD.This type is wrapped in copper wire. It can stay in place for 10 years. Permanent methods of contraception Female tubal ligation In this method, a woman's fallopian tubes are sealed, tied, or blocked during surgery to prevent eggs from traveling to the uterus. Hysteroscopic sterilization In this method, a small, flexible insert is placed into each fallopian tube. The inserts cause scar tissue to form in the fallopian tubes and block them, so sperm cannot reach an egg. The procedure takes about 3 months to be effective. Another form of birth control must be used during those 3 months. Female sterilization This is a procedure to tie off the tubes that carry sperm (vasectomy). After the procedure, the man can still ejaculate fluid (semen). Another form of birth control must be used for 3 months after the procedure. Natural planning methods Natural family planning In this method, a couple does not have sex on days when the woman could become pregnant. Calendar method In this method, the woman keeps track of the length of each menstrual cycle, identifies the days when pregnancy can happen, and does not have sex on those days. Ovulation method In this method, a couple avoids sex during ovulation. Symptothermal method This method involves not having sex during ovulation. The woman typically checks for ovulation by watching changes in her temperature and in the consistency of cervical mucus. Post-ovulation method In this method, a couple waits to have sex until after ovulation. Where to find more information  Centers for Disease Control and Prevention: www.cdc.gov Summary  Contraception, also called birth control, refers to methods or  devices that prevent pregnancy.  Hormonal methods of contraception include implants, injections, pills, patches, vaginal rings, and emergency contraceptives.  Barrier methods of contraception can include female condoms, female condoms, diaphragms, cervical caps, sponges, and spermicides.  There are two types of IUDs (intrauterine devices). An IUD can be put in a woman's uterus to prevent pregnancy for 3-5 years.  Permanent sterilization can be done through a procedure for males and females. Natural family planning methods involve nothaving sex on days when the woman could become pregnant. This information is not intended to replace advice given to you by your health care provider. Make sure you discuss any questions you have with your health care provider. Document Revised: 06/21/2019 Document Reviewed: 06/21/2019 Elsevier Patient Education  2021 [employer] Inc.   Breastfeeding  Choosing to breastfeed is one of the best decisions you can make for yourself and your baby. A change in hormones during pregnancy causes your breasts to make breast milk in your milk-producing glands. Hormones prevent breast milk from being released before your baby is born. They also prompt milk flow after birth. Once breastfeeding has begun, thoughts of your baby, as well as his or her sucking or crying, can stimulate the release of milk from your milk-producing glands. Benefits of breastfeeding Research shows that breastfeeding offers many health benefits   for infants and mothers. It also offers a cost-free and convenient way to feed your baby. For your baby  Your first milk (colostrum) helps your baby's digestive system to function better.  Special cells in your milk (antibodies) help your baby to fight off infections.  Breastfed babies are less likely to develop asthma, allergies, obesity, or type 2 diabetes. They are also at lower risk for sudden infant death syndrome (SIDS).  Nutrients in breast milk are better  able to meet your baby's needs compared to infant formula.  Breast milk improves your baby's brain development. For you  Breastfeeding helps to create a very special bond between you and your baby.  Breastfeeding is convenient. Breast milk costs nothing and is always available at the correct temperature.  Breastfeeding helps to burn calories. It helps you to lose the weight that you gained during pregnancy.  Breastfeeding makes your uterus return faster to its size before pregnancy. It also slows bleeding (lochia) after you give birth.  Breastfeeding helps to lower your risk of developing type 2 diabetes, osteoporosis, rheumatoid arthritis, cardiovascular disease, and breast, ovarian, uterine, and endometrial cancer later in life. Breastfeeding basics Starting breastfeeding  Find a comfortable place to sit or lie down, with your neck and back well-supported.  Place a pillow or a rolled-up blanket under your baby to bring him or her to the level of your breast (if you are seated). Nursing pillows are specially designed to help support your arms and your baby while you breastfeed.  Make sure that your baby's tummy (abdomen) is facing your abdomen.  Gently massage your breast. With your fingertips, massage from the outer edges of your breast inward toward the nipple. This encourages milk flow. If your milk flows slowly, you may need to continue this action during the feeding.  Support your breast with 4 fingers underneath and your thumb above your nipple (make the letter "C" with your hand). Make sure your fingers are well away from your nipple and your baby's mouth.  Stroke your baby's lips gently with your finger or nipple.  When your baby's mouth is open wide enough, quickly bring your baby to your breast, placing your entire nipple and as much of the areola as possible into your baby's mouth. The areola is the colored area around your nipple. ? More areola should be visible above your  baby's upper lip than below the lower lip. ? Your baby's lips should be opened and extended outward (flanged) to ensure an adequate, comfortable latch. ? Your baby's tongue should be between his or her lower gum and your breast.  Make sure that your baby's mouth is correctly positioned around your nipple (latched). Your baby's lips should create a seal on your breast and be turned out (everted).  It is common for your baby to suck about 2-3 minutes in order to start the flow of breast milk. Latching Teaching your baby how to latch onto your breast properly is very important. An improper latch can cause nipple pain, decreased milk supply, and poor weight gain in your baby. Also, if your baby is not latched onto your nipple properly, he or she may swallow some air during feeding. This can make your baby fussy. Burping your baby when you switch breasts during the feeding can help to get rid of the air. However, teaching your baby to latch on properly is still the best way to prevent fussiness from swallowing air while breastfeeding. Signs that your baby has successfully latched onto   your nipple  Silent tugging or silent sucking, without causing you pain. Infant's lips should be extended outward (flanged).  Swallowing heard between every 3-4 sucks once your milk has started to flow (after your let-down milk reflex occurs).  Muscle movement above and in front of his or her ears while sucking. Signs that your baby has not successfully latched onto your nipple  Sucking sounds or smacking sounds from your baby while breastfeeding.  Nipple pain. If you think your baby has not latched on correctly, slip your finger into the corner of your baby's mouth to break the suction and place it between your baby's gums. Attempt to start breastfeeding again. Signs of successful breastfeeding Signs from your baby  Your baby will gradually decrease the number of sucks or will completely stop sucking.  Your baby  will fall asleep.  Your baby's body will relax.  Your baby will retain a small amount of milk in his or her mouth.  Your baby will let go of your breast by himself or herself. Signs from you  Breasts that have increased in firmness, weight, and size 1-3 hours after feeding.  Breasts that are softer immediately after breastfeeding.  Increased milk volume, as well as a change in milk consistency and color by the fifth day of breastfeeding.  Nipples that are not sore, cracked, or bleeding. Signs that your baby is getting enough milk  Wetting at least 1-2 diapers during the first 24 hours after birth.  Wetting at least 5-6 diapers every 24 hours for the first week after birth. The urine should be clear or pale yellow by the age of 5 days.  Wetting 6-8 diapers every 24 hours as your baby continues to grow and develop.  At least 3 stools in a 24-hour period by the age of 5 days. The stool should be soft and yellow.  At least 3 stools in a 24-hour period by the age of 7 days. The stool should be seedy and yellow.  No loss of weight greater than 10% of birth weight during the first 3 days of life.  Average weight gain of 4-7 oz (113-198 g) per week after the age of 4 days.  Consistent daily weight gain by the age of 5 days, without weight loss after the age of 2 weeks. After a feeding, your baby may spit up a small amount of milk. This is normal. Breastfeeding frequency and duration Frequent feeding will help you make more milk and can prevent sore nipples and extremely full breasts (breast engorgement). Breastfeed when you feel the need to reduce the fullness of your breasts or when your baby shows signs of hunger. This is called "breastfeeding on demand." Signs that your baby is hungry include:  Increased alertness, activity, or restlessness.  Movement of the head from side to side.  Opening of the mouth when the corner of the mouth or cheek is stroked (rooting).  Increased  sucking sounds, smacking lips, cooing, sighing, or squeaking.  Hand-to-mouth movements and sucking on fingers or hands.  Fussing or crying. Avoid introducing a pacifier to your baby in the first 4-6 weeks after your baby is born. After this time, you may choose to use a pacifier. Research has shown that pacifier use during the first year of a baby's life decreases the risk of sudden infant death syndrome (SIDS). Allow your baby to feed on each breast as long as he or she wants. When your baby unlatches or falls asleep while feeding from the   first breast, offer the second breast. Because newborns are often sleepy in the first few weeks of life, you may need to awaken your baby to get him or her to feed. Breastfeeding times will vary from baby to baby. However, the following rules can serve as a guide to help you make sure that your baby is properly fed:  Newborns (babies 4 weeks of age or younger) may breastfeed every 1-3 hours.  Newborns should not go without breastfeeding for longer than 3 hours during the day or 5 hours during the night.  You should breastfeed your baby a minimum of 8 times in a 24-hour period. Breast milk pumping Pumping and storing breast milk allows you to make sure that your baby is exclusively fed your breast milk, even at times when you are unable to breastfeed. This is especially important if you go back to work while you are still breastfeeding, or if you are not able to be present during feedings. Your lactation consultant can help you find a method of pumping that works best for you and give you guidelines about how long it is safe to store breast milk.      Caring for your breasts while you breastfeed Nipples can become dry, cracked, and sore while breastfeeding. The following recommendations can help keep your breasts moisturized and healthy:  Avoid using soap on your nipples.  Wear a supportive bra designed especially for nursing. Avoid wearing underwire-style  bras or extremely tight bras (sports bras).  Air-dry your nipples for 3-4 minutes after each feeding.  Use only cotton bra pads to absorb leaked breast milk. Leaking of breast milk between feedings is normal.  Use lanolin on your nipples after breastfeeding. Lanolin helps to maintain your skin's normal moisture barrier. Pure lanolin is not harmful (not toxic) to your baby. You may also hand express a few drops of breast milk and gently massage that milk into your nipples and allow the milk to air-dry. In the first few weeks after giving birth, some women experience breast engorgement. Engorgement can make your breasts feel heavy, warm, and tender to the touch. Engorgement peaks within 3-5 days after you give birth. The following recommendations can help to ease engorgement:  Completely empty your breasts while breastfeeding or pumping. You may want to start by applying warm, moist heat (in the shower or with warm, water-soaked hand towels) just before feeding or pumping. This increases circulation and helps the milk flow. If your baby does not completely empty your breasts while breastfeeding, pump any extra milk after he or she is finished.  Apply ice packs to your breasts immediately after breastfeeding or pumping, unless this is too uncomfortable for you. To do this: ? Put ice in a plastic bag. ? Place a towel between your skin and the bag. ? Leave the ice on for 20 minutes, 2-3 times a day.  Make sure that your baby is latched on and positioned properly while breastfeeding. If engorgement persists after 48 hours of following these recommendations, contact your health care provider or a lactation consultant. Overall health care recommendations while breastfeeding  Eat 3 healthy meals and 3 snacks every day. Well-nourished mothers who are breastfeeding need an additional 450-500 calories a day. You can meet this requirement by increasing the amount of a balanced diet that you eat.  Drink  enough water to keep your urine pale yellow or clear.  Rest often, relax, and continue to take your prenatal vitamins to prevent fatigue, stress, and low   vitamin and mineral levels in your body (nutrient deficiencies).  Do not use any products that contain nicotine or tobacco, such as cigarettes and e-cigarettes. Your baby may be harmed by chemicals from cigarettes that pass into breast milk and exposure to secondhand smoke. If you need help quitting, ask your health care provider.  Avoid alcohol.  Do not use illegal drugs or marijuana.  Talk with your health care provider before taking any medicines. These include over-the-counter and prescription medicines as well as vitamins and herbal supplements. Some medicines that may be harmful to your baby can pass through breast milk.  It is possible to become pregnant while breastfeeding. If birth control is desired, ask your health care provider about options that will be safe while breastfeeding your baby. Where to find more information: [employer]: www.llli.org Contact a health care provider if:  You feel like you want to stop breastfeeding or have become frustrated with breastfeeding.  Your nipples are cracked or bleeding.  Your breasts are red, tender, or warm.  You have: ? Painful breasts or nipples. ? A swollen area on either breast. ? A fever or chills. ? Nausea or vomiting. ? Drainage other than breast milk from your nipples.  Your breasts do not become full before feedings by the fifth day after you give birth.  You feel sad and depressed.  Your baby is: ? Too sleepy to eat well. ? Having trouble sleeping. ? More than 1 week old and wetting fewer than 6 diapers in a 24-hour period. ? Not gaining weight by 5 days of age.  Your baby has fewer than 3 stools in a 24-hour period.  Your baby's skin or the white parts of his or her eyes become yellow. Get help right away if:  Your baby is overly tired  (lethargic) and does not want to wake up and feed.  Your baby develops an unexplained fever. Summary  Breastfeeding offers many health benefits for infant and mothers.  Try to breastfeed your infant when he or she shows early signs of hunger.  Gently tickle or stroke your baby's lips with your finger or nipple to allow the baby to open his or her mouth. Bring the baby to your breast. Make sure that much of the areola is in your baby's mouth. Offer one side and burp the baby before you offer the other side.  Talk with your health care provider or lactation consultant if you have questions or you face problems as you breastfeed. This information is not intended to replace advice given to you by your health care provider. Make sure you discuss any questions you have with your health care provider. Document Revised: 04/10/2017 Document Reviewed: 02/16/2016 Elsevier Patient Education  2021 Elsevier Inc.  

## 2020-05-26 NOTE — Progress Notes (Signed)
   Subjective:  Morgan Mathews is a 30 y.o. G4P3003 at [redacted]w[redacted]d being seen today for ongoing prenatal care.  She is currently monitored for the following issues for this low-risk pregnancy and has Nausea and vomiting in pregnancy; BMI 30.0-30.9,adult; Supervision of other normal pregnancy, antepartum; and History of gestational diabetes in prior pregnancy, currently pregnant on their problem list.  Patient reports no complaints.  Contractions: Not present. Vag. Bleeding: None.  Movement: Absent. Denies leaking of fluid.   The following portions of the patient's history were reviewed and updated as appropriate: allergies, current medications, past family history, past medical history, past social history, past surgical history and problem list. Problem list updated.  Objective:   Vitals:   05/26/20 0839  BP: 107/72  Pulse: 71  Weight: 239 lb (108.4 kg)    Fetal Status: Fetal Heart Rate (bpm): 148   Movement: Absent     General:  Alert, oriented and cooperative. Patient is in no acute distress.  Skin: Skin is warm and dry. No rash noted.   Cardiovascular: Normal heart rate noted  Respiratory: Normal respiratory effort, no problems with respiration noted  Abdomen: Soft, gravid, appropriate for gestational age. Pain/Pressure: Present     Pelvic: Vag. Bleeding: None     Cervical exam deferred        Extremities: Normal range of motion.  Edema: None  Mental Status: Normal mood and affect. Normal behavior. Normal judgment and thought content.   Urinalysis:      Assessment and Plan:  Pregnancy: G4P3003 at [redacted]w[redacted]d  1. Supervision of other normal pregnancy, antepartum BP and FHR normal Reviewed labs from new OB, all questions answered AFP today  2. History of gestational diabetes in prior pregnancy, currently pregnant   3. BMI 30.0-30.9,adult   Preterm labor symptoms and general obstetric precautions including but not limited to vaginal bleeding, contractions, leaking of fluid and  fetal movement were reviewed in detail with the patient. Please refer to After Visit Summary for other counseling recommendations.  Return in 4 weeks (on 06/23/2020) for [facility], ob visit.   Venora Maples, MD

## 2020-05-27 LAB — GLUCOSE TOLERANCE, 2 HOURS W/ 1HR
Glucose, 1 hour: 129 mg/dL (ref 65–179)
Glucose, 2 hour: 141 mg/dL (ref 65–152)
Glucose, Fasting: 84 mg/dL (ref 65–91)

## 2020-05-28 LAB — AFP, SERUM, OPEN SPINA BIFIDA
AFP MoM: 1.4
AFP Value: 35.3 ng/mL
Gest. Age on Collection Date: 15 weeks
Maternal Age At EDD: 29.1 yr
OSBR Risk 1 IN: 7201
Test Results:: NEGATIVE
Weight: 240 [lb_av]

## 2020-06-23 ENCOUNTER — Other Ambulatory Visit: Payer: Self-pay

## 2020-06-23 ENCOUNTER — Other Ambulatory Visit: Payer: Self-pay | Admitting: Obstetrics and Gynecology

## 2020-06-23 ENCOUNTER — Other Ambulatory Visit: Payer: Self-pay | Admitting: Nurse Practitioner

## 2020-06-23 ENCOUNTER — Ambulatory Visit: Payer: Medicaid Other | Attending: Nurse Practitioner

## 2020-06-23 ENCOUNTER — Ambulatory Visit (INDEPENDENT_AMBULATORY_CARE_PROVIDER_SITE_OTHER): Payer: Medicaid Other | Admitting: Medical

## 2020-06-23 ENCOUNTER — Encounter: Payer: Self-pay | Admitting: Medical

## 2020-06-23 VITALS — BP 114/76 | HR 92 | Wt 251.6 lb

## 2020-06-23 DIAGNOSIS — O09299 Supervision of pregnancy with other poor reproductive or obstetric history, unspecified trimester: Secondary | ICD-10-CM

## 2020-06-23 DIAGNOSIS — Z363 Encounter for antenatal screening for malformations: Secondary | ICD-10-CM | POA: Diagnosis not present

## 2020-06-23 DIAGNOSIS — E669 Obesity, unspecified: Secondary | ICD-10-CM

## 2020-06-23 DIAGNOSIS — O99212 Obesity complicating pregnancy, second trimester: Secondary | ICD-10-CM

## 2020-06-23 DIAGNOSIS — Z8632 Personal history of gestational diabetes: Secondary | ICD-10-CM

## 2020-06-23 DIAGNOSIS — Z3480 Encounter for supervision of other normal pregnancy, unspecified trimester: Secondary | ICD-10-CM | POA: Diagnosis present

## 2020-06-23 DIAGNOSIS — N949 Unspecified condition associated with female genital organs and menstrual cycle: Secondary | ICD-10-CM

## 2020-06-23 DIAGNOSIS — Z6830 Body mass index (BMI) 30.0-30.9, adult: Secondary | ICD-10-CM

## 2020-06-23 DIAGNOSIS — Z3A19 19 weeks gestation of pregnancy: Secondary | ICD-10-CM | POA: Diagnosis not present

## 2020-06-23 DIAGNOSIS — M25561 Pain in right knee: Secondary | ICD-10-CM

## 2020-06-23 NOTE — Progress Notes (Signed)
   PRENATAL VISIT NOTE  Subjective:  Morgan Mathews is a 30 y.o. G4P3003 at [redacted]w[redacted]d being seen today for ongoing prenatal care.  She is currently monitored for the following issues for this high-risk pregnancy and has Nausea and vomiting in pregnancy; BMI 30.0-30.9,adult; Supervision of other normal pregnancy, antepartum; and History of gestational diabetes in prior pregnancy, currently pregnant on their problem list.  Patient reports round ligament pain, right knee pain.  Contractions: Not present. Vag. Bleeding: None.  Movement: Absent. Denies leaking of fluid.   The following portions of the patient's history were reviewed and updated as appropriate: allergies, current medications, past family history, past medical history, past social history, past surgical history and problem list.   Objective:   Vitals:   06/23/20 0901  BP: 114/76  Pulse: 92  Weight: 251 lb 9.6 oz (114.1 kg)    Fetal Status: Fetal Heart Rate (bpm): 144   Movement: Absent     General:  Alert, oriented and cooperative. Patient is in no acute distress.  Skin: Skin is warm and dry. No rash noted.   Cardiovascular: Normal heart rate noted  Respiratory: Normal respiratory effort, no problems with respiration noted  Abdomen: Soft, gravid, appropriate for gestational age.  Pain/Pressure: Present     Pelvic: Cervical exam deferred        Extremities: Normal range of motion.  Edema: None No clicking or popping of the right knee joint with ROM, full ROM noted without pain, no tenderness to palpation, no laxity noted  Mental Status: Normal mood and affect. Normal behavior. Normal judgment and thought content.   Assessment and Plan:  Pregnancy: G4P3003 at [redacted]w[redacted]d 1. Supervision of other normal pregnancy, antepartum - Still undecided on MOC  2. History of gestational diabetes in prior pregnancy, currently pregnant - Normal early A1c, advised of need for GTT at 28 weeks   3. BMI 30.0-30.9,adult - Recommended weight gain  discussed   4. Round ligament pain - Advised to try belly band, tylenol and hydrotherapy   5. Acute pain of right knee - Advised to try ice, tylenol  6. [redacted] weeks gestation of pregnancy  Preterm labor symptoms and general obstetric precautions including but not limited to vaginal bleeding, contractions, leaking of fluid and fetal movement were reviewed in detail with the patient. Please refer to After Visit Summary for other counseling recommendations.   Return in about 4 weeks (around 07/21/2020) for LOB, In-Person, any provider.  Future Appointments  Date Time Provider Department Center  06/23/2020 10:30 AM [facility] US3 [facility]  07/21/2020 10:30 AM Kathlene Cote [facility]    Vonzella Nipple, PA-C

## 2020-06-23 NOTE — Patient Instructions (Signed)

## 2020-07-21 ENCOUNTER — Encounter: Payer: Self-pay | Admitting: *Deleted

## 2020-07-21 ENCOUNTER — Ambulatory Visit: Payer: Medicaid Other | Attending: Obstetrics and Gynecology

## 2020-07-21 ENCOUNTER — Other Ambulatory Visit (HOSPITAL_COMMUNITY)
Admission: RE | Admit: 2020-07-21 | Discharge: 2020-07-21 | Disposition: A | Discharge status: Home / Self Care | Payer: Medicaid Other | Source: Ambulatory Visit | Attending: Medical | Admitting: Medical

## 2020-07-21 ENCOUNTER — Other Ambulatory Visit: Payer: Self-pay

## 2020-07-21 ENCOUNTER — Ambulatory Visit (INDEPENDENT_AMBULATORY_CARE_PROVIDER_SITE_OTHER): Payer: Medicaid Other | Admitting: Medical

## 2020-07-21 ENCOUNTER — Ambulatory Visit: Payer: Medicaid Other | Admitting: *Deleted

## 2020-07-21 ENCOUNTER — Encounter: Payer: Self-pay | Admitting: Medical

## 2020-07-21 VITALS — BP 103/69 | HR 91 | Wt 256.7 lb

## 2020-07-21 VITALS — BP 91/50 | HR 84

## 2020-07-21 DIAGNOSIS — N898 Other specified noninflammatory disorders of vagina: Secondary | ICD-10-CM

## 2020-07-21 DIAGNOSIS — Z8632 Personal history of gestational diabetes: Secondary | ICD-10-CM

## 2020-07-21 DIAGNOSIS — Z362 Encounter for other antenatal screening follow-up: Secondary | ICD-10-CM

## 2020-07-21 DIAGNOSIS — Z3A23 23 weeks gestation of pregnancy: Secondary | ICD-10-CM

## 2020-07-21 DIAGNOSIS — Z3480 Encounter for supervision of other normal pregnancy, unspecified trimester: Secondary | ICD-10-CM | POA: Diagnosis present

## 2020-07-21 DIAGNOSIS — O26892 Other specified pregnancy related conditions, second trimester: Secondary | ICD-10-CM | POA: Insufficient documentation

## 2020-07-21 DIAGNOSIS — O99212 Obesity complicating pregnancy, second trimester: Secondary | ICD-10-CM | POA: Diagnosis not present

## 2020-07-21 DIAGNOSIS — B3731 Acute candidiasis of vulva and vagina: Secondary | ICD-10-CM

## 2020-07-21 DIAGNOSIS — Z6830 Body mass index (BMI) 30.0-30.9, adult: Secondary | ICD-10-CM

## 2020-07-21 DIAGNOSIS — B373 Candidiasis of vulva and vagina: Secondary | ICD-10-CM

## 2020-07-21 DIAGNOSIS — O09299 Supervision of pregnancy with other poor reproductive or obstetric history, unspecified trimester: Secondary | ICD-10-CM

## 2020-07-21 LAB — OB RESULTS CONSOLE GC/CHLAMYDIA: Gonorrhea: NEGATIVE

## 2020-07-21 NOTE — Progress Notes (Signed)
   PRENATAL VISIT NOTE  Subjective:  Morgan Mathews is a 30 y.o. G4P3003 at [redacted]w[redacted]d being seen today for ongoing prenatal care.  She is currently monitored for the following issues for this low-risk pregnancy and has Nausea and vomiting in pregnancy; BMI 30.0-30.9,adult; Supervision of other normal pregnancy, antepartum; and History of gestational diabetes in prior pregnancy, currently pregnant on their problem list.  Patient reports vaginal irritation and itching and discharge.  Contractions: Not present. Vag. Bleeding: None.  Movement: Present. Denies leaking of fluid.   The following portions of the patient's history were reviewed and updated as appropriate: allergies, current medications, past family history, past medical history, past social history, past surgical history and problem list.   Objective:   Vitals:   07/21/20 1105  BP: 103/69  Pulse: 91  Weight: 256 lb 11.2 oz (116.4 kg)    Fetal Status: Fetal Heart Rate (bpm): 147   Movement: Present     General:  Alert, oriented and cooperative. Patient is in no acute distress.  Skin: Skin is warm and dry. No rash noted.   Cardiovascular: Normal heart rate noted  Respiratory: Normal respiratory effort, no problems with respiration noted  Abdomen: Soft, gravid, appropriate for gestational age.  Pain/Pressure: Absent     Pelvic: Cervical exam deferred        Extremities: Normal range of motion.  Edema: None  Mental Status: Normal mood and affect. Normal behavior. Normal judgment and thought content.   Assessment and Plan:  Pregnancy: G4P3003 at [redacted]w[redacted]d 1. Vaginal discharge during pregnancy in second trimester - Cervicovaginal ancillary only( [facility]) - self-swab  2. Supervision of other normal pregnancy, antepartum - Anticipatory guidance for next visit including GTT, HIV, RPR, CBC and TDap discussed  - Still undecided about MOC - Follow-up US for anatomy today   3. BMI 30.0-30.9,adult - 6# TWG, patient doing well   4.  History of gestational diabetes in prior pregnancy, currently pregnant - Normal early screening, 2 hour GTT planned for next visit   5. [redacted] weeks gestation of pregnancy  Preterm labor symptoms and general obstetric precautions including but not limited to vaginal bleeding, contractions, leaking of fluid and fetal movement were reviewed in detail with the patient. Please refer to After Visit Summary for other counseling recommendations.   Return in about 4 weeks (around 08/18/2020) for LOB, 28 week labs (fasting), In-Person, any provider.  Future Appointments  Date Time Provider Department Center  07/21/2020 12:30 PM [facility] NURSE [facility]  07/21/2020 12:45 PM [facility] US4 [facility]    Vonzella Nipple, PA-C

## 2020-07-21 NOTE — Progress Notes (Signed)
Pt states is having a lot of Greenish d/c with itching.

## 2020-07-24 ENCOUNTER — Other Ambulatory Visit: Payer: Self-pay | Admitting: *Deleted

## 2020-07-24 DIAGNOSIS — O409XX0 Polyhydramnios, unspecified trimester, not applicable or unspecified: Secondary | ICD-10-CM

## 2020-07-24 LAB — CERVICOVAGINAL ANCILLARY ONLY
Bacterial Vaginitis (gardnerella): NEGATIVE
Candida Glabrata: NEGATIVE
Candida Vaginitis: POSITIVE — AB
Chlamydia: NEGATIVE
Comment: NEGATIVE
Comment: NEGATIVE
Comment: NEGATIVE
Comment: NEGATIVE
Comment: NEGATIVE
Comment: NORMAL
Neisseria Gonorrhea: NEGATIVE
Trichomonas: NEGATIVE

## 2020-07-24 MED ORDER — TERCONAZOLE 0.4 % VA CREA: 1.0000 | TOPICAL_CREAM | Freq: Every day | VAGINAL | 0 refills | Status: DC

## 2020-07-24 NOTE — Addendum Note (Signed)
Addended by: Marny Lowenstein on: 07/24/2020 02:11 PM   Modules accepted: Orders

## 2020-08-21 ENCOUNTER — Other Ambulatory Visit: Payer: Self-pay | Admitting: Obstetrics and Gynecology

## 2020-08-21 ENCOUNTER — Other Ambulatory Visit: Payer: Self-pay

## 2020-08-21 ENCOUNTER — Ambulatory Visit: Payer: Medicaid Other | Admitting: *Deleted

## 2020-08-21 ENCOUNTER — Ambulatory Visit: Payer: Medicaid Other | Attending: Obstetrics and Gynecology

## 2020-08-21 DIAGNOSIS — Z362 Encounter for other antenatal screening follow-up: Secondary | ICD-10-CM

## 2020-08-21 DIAGNOSIS — Z3480 Encounter for supervision of other normal pregnancy, unspecified trimester: Secondary | ICD-10-CM | POA: Diagnosis present

## 2020-08-21 DIAGNOSIS — O3662X0 Maternal care for excessive fetal growth, second trimester, not applicable or unspecified: Secondary | ICD-10-CM

## 2020-08-21 DIAGNOSIS — O99212 Obesity complicating pregnancy, second trimester: Secondary | ICD-10-CM | POA: Diagnosis not present

## 2020-08-21 DIAGNOSIS — O99210 Obesity complicating pregnancy, unspecified trimester: Secondary | ICD-10-CM

## 2020-08-21 DIAGNOSIS — O09292 Supervision of pregnancy with other poor reproductive or obstetric history, second trimester: Secondary | ICD-10-CM

## 2020-08-21 DIAGNOSIS — O09299 Supervision of pregnancy with other poor reproductive or obstetric history, unspecified trimester: Secondary | ICD-10-CM

## 2020-08-21 DIAGNOSIS — Z3A27 27 weeks gestation of pregnancy: Secondary | ICD-10-CM | POA: Diagnosis not present

## 2020-08-21 DIAGNOSIS — O409XX0 Polyhydramnios, unspecified trimester, not applicable or unspecified: Secondary | ICD-10-CM | POA: Diagnosis not present

## 2020-08-21 NOTE — Progress Notes (Signed)
C/o" lump in both armpits x 1 week, very sore, right side drained yellow pus-no odor." Has appointment with OB MD on 08/23/20.

## 2020-08-23 ENCOUNTER — Encounter: Payer: Self-pay | Admitting: Obstetrics and Gynecology

## 2020-08-23 ENCOUNTER — Other Ambulatory Visit: Payer: Self-pay

## 2020-08-23 ENCOUNTER — Other Ambulatory Visit: Payer: No Typology Code available for payment source

## 2020-08-23 ENCOUNTER — Ambulatory Visit (INDEPENDENT_AMBULATORY_CARE_PROVIDER_SITE_OTHER): Payer: Medicaid Other | Admitting: Obstetrics and Gynecology

## 2020-08-23 VITALS — BP 107/72 | HR 91 | Wt 261.0 lb

## 2020-08-23 DIAGNOSIS — Z8632 Personal history of gestational diabetes: Secondary | ICD-10-CM

## 2020-08-23 DIAGNOSIS — Z3480 Encounter for supervision of other normal pregnancy, unspecified trimester: Secondary | ICD-10-CM

## 2020-08-23 DIAGNOSIS — Z23 Encounter for immunization: Secondary | ICD-10-CM | POA: Diagnosis not present

## 2020-08-23 DIAGNOSIS — O09299 Supervision of pregnancy with other poor reproductive or obstetric history, unspecified trimester: Secondary | ICD-10-CM

## 2020-08-23 DIAGNOSIS — O24419 Gestational diabetes mellitus in pregnancy, unspecified control: Secondary | ICD-10-CM

## 2020-08-23 MED ORDER — CEPHALEXIN 500 MG PO CAPS: 500.0000 mg | ORAL_CAPSULE | Freq: Four times a day (QID) | ORAL | 2 refills | Status: DC

## 2020-08-23 NOTE — Progress Notes (Signed)
   PRENATAL VISIT NOTE  Subjective:  Morgan Mathews is a 30 y.o. G4P3003 at [redacted]w[redacted]d being seen today for ongoing prenatal care.  She is currently monitored for the following issues for this high-risk pregnancy and has Nausea and vomiting in pregnancy; BMI 30.0-30.9,adult; Supervision of other normal pregnancy, antepartum; and History of gestational diabetes in prior pregnancy, currently pregnant on their problem list.  Patient reports painful lump in left armpit which is drainaing purulent discharge.  Contractions: Not present. Vag. Bleeding: None.  Movement: Present. Denies leaking of fluid.   The following portions of the patient's history were reviewed and updated as appropriate: allergies, current medications, past family history, past medical history, past social history, past surgical history and problem list.   Objective:   Vitals:   08/23/20 0910  BP: 107/72  Pulse: 91  Weight: 261 lb (118.4 kg)    Fetal Status: Fetal Heart Rate (bpm): 138 Fundal Height: 30 cm Movement: Present     General:  Alert, oriented and cooperative. Patient is in no acute distress.  Skin: Skin is warm and dry. No rash noted.   Cardiovascular: Normal heart rate noted  Respiratory: Normal respiratory effort, no problems with respiration noted  Abdomen: Soft, gravid, appropriate for gestational age.  Pain/Pressure: Absent     Pelvic: Cervical exam deferred        Extremities: Normal range of motion.  Edema: Trace  Mental Status: Normal mood and affect. Normal behavior. Normal judgment and thought content.   Assessment and Plan:  Pregnancy: G4P3003 at [redacted]w[redacted]d 1. Supervision of other normal pregnancy, antepartum Patient is doing well Left arm pit with draining abscess consistent with hydranitis suppurativa- Advised patient to apply warm compresses to the area and Rx Keflex provided Patient undecided on contraception  2. History of gestational diabetes in prior pregnancy, currently pregnant Third  trimester labs with glucola today Tdap today Follow up growth ultrasound scheduled in August  Preterm labor symptoms and general obstetric precautions including but not limited to vaginal bleeding, contractions, leaking of fluid and fetal movement were reviewed in detail with the patient. Please refer to After Visit Summary for other counseling recommendations.   Return in about 2 weeks (around 09/06/2020) for in person, ROB, High risk.  Future Appointments  Date Time Provider Department Center  08/23/2020 10:35 AM Catalina Antigua, MD [facility]  09/18/2020  8:30 AM [facility] NURSE [facility]  09/18/2020  8:45 AM [facility] US5 [facility]    Catalina Antigua, MD

## 2020-08-24 ENCOUNTER — Telehealth: Payer: Self-pay

## 2020-08-24 ENCOUNTER — Encounter: Payer: Self-pay | Admitting: Obstetrics and Gynecology

## 2020-08-24 DIAGNOSIS — O24419 Gestational diabetes mellitus in pregnancy, unspecified control: Secondary | ICD-10-CM | POA: Insufficient documentation

## 2020-08-24 LAB — GLUCOSE TOLERANCE, 2 HOURS W/ 1HR
Glucose, 1 hour: 236 mg/dL — ABNORMAL HIGH (ref 65–179)
Glucose, 2 hour: 137 mg/dL (ref 65–152)
Glucose, Fasting: 105 mg/dL — ABNORMAL HIGH (ref 65–91)

## 2020-08-24 LAB — CBC
Hematocrit: 32.8 % — ABNORMAL LOW (ref 34.0–46.6)
Hemoglobin: 10.9 g/dL — ABNORMAL LOW (ref 11.1–15.9)
MCH: 32.6 pg (ref 26.6–33.0)
MCHC: 33.2 g/dL (ref 31.5–35.7)
MCV: 98 fL — ABNORMAL HIGH (ref 79–97)
Platelets: 220 10*3/uL (ref 150–450)
RBC: 3.34 x10E6/uL — ABNORMAL LOW (ref 3.77–5.28)
RDW: 11.8 % (ref 11.7–15.4)
WBC: 4.9 10*3/uL (ref 3.4–10.8)

## 2020-08-24 LAB — HIV ANTIBODY (ROUTINE TESTING W REFLEX): HIV Screen 4th Generation wRfx: NONREACTIVE

## 2020-08-24 LAB — RPR: RPR Ser Ql: NONREACTIVE

## 2020-08-24 NOTE — Telephone Encounter (Addendum)
-----   Message from Catalina Antigua, MD sent at 08/24/2020  8:44 AM EDT ----- Please inform patient of failed glucola and diagnosis of diabetes in pregnancy. Patient will be referred to diabetic education. Patient should restart her diabetic diet and CBG testing if she has her meter from previous pregnancy     Per chart review patient had scheduled diabetes education appointment on 09/05/20. Attempted to call patient with results and to verify appointment on 8/9. Did not make contact with patient but left voicemail reminding patient of upcoming appointment with diabetes education on 09/05/20, encouraged patient to call our office with any questions or concerns.  Wynona Canes, CMA

## 2020-09-05 ENCOUNTER — Other Ambulatory Visit: Payer: No Typology Code available for payment source

## 2020-09-06 ENCOUNTER — Other Ambulatory Visit: Payer: Self-pay

## 2020-09-06 ENCOUNTER — Ambulatory Visit (INDEPENDENT_AMBULATORY_CARE_PROVIDER_SITE_OTHER): Payer: Medicaid Other | Admitting: Nurse Practitioner

## 2020-09-06 VITALS — BP 121/63 | HR 108 | Wt 263.5 lb

## 2020-09-06 DIAGNOSIS — O09899 Supervision of other high risk pregnancies, unspecified trimester: Secondary | ICD-10-CM

## 2020-09-06 DIAGNOSIS — Z3A29 29 weeks gestation of pregnancy: Secondary | ICD-10-CM

## 2020-09-06 DIAGNOSIS — O24419 Gestational diabetes mellitus in pregnancy, unspecified control: Secondary | ICD-10-CM

## 2020-09-06 NOTE — Progress Notes (Signed)
    Subjective:  Morgan Mathews is a 30 y.o. G4P3003 at [redacted]w[redacted]d being seen today for ongoing prenatal care.  She is currently monitored for the following issues for this high-risk pregnancy and has Nausea and vomiting in pregnancy; BMI 30.0-30.9,adult; Supervision of other normal pregnancy, antepartum; History of gestational diabetes in prior pregnancy, currently pregnant; and Gestational diabetes mellitus (GDM) affecting pregnancy, antepartum on their problem list.  Patient reports no complaints.  Contractions: Not present. Vag. Bleeding: None.  Movement: Present. Denies leaking of fluid.   The following portions of the patient's history were reviewed and updated as appropriate: allergies, current medications, past family history, past medical history, past social history, past surgical history and problem list. Problem list updated.  Objective:   Vitals:   09/06/20 1624  BP: 121/63  Pulse: (!) 108  Weight: 263 lb 8 oz (119.5 kg)    Fetal Status: Fetal Heart Rate (bpm): 142   Movement: Present     General:  Alert, oriented and cooperative. Patient is in no acute distress.  Skin: Skin is warm and dry. No rash noted.   Cardiovascular: Normal heart rate noted  Respiratory: Normal respiratory effort, no problems with respiration noted  Abdomen: Soft, gravid, appropriate for gestational age. Pain/Pressure: Absent     Pelvic:  Cervical exam deferred        Extremities: Normal range of motion.  Edema: None  Mental Status: Normal mood and affect. Normal behavior. Normal judgment and thought content.   Urinalysis:      Assessment and Plan:  Pregnancy: G4P3003 at [redacted]w[redacted]d  1. Supervision of other normal pregnancy, antepartum Saw pregnancy navigator at visit today  2. Gestational diabetes mellitus (GDM) affecting pregnancy, antepartum Was not aware she was scheduled with diabetes educator and did not come to the appointment.  Will reschedule appointment Does not have glucose meter from last  pregnancy.  3. [redacted] weeks gestation of pregnancy   Preterm labor symptoms and general obstetric precautions including but not limited to vaginal bleeding, contractions, leaking of fluid and fetal movement were reviewed in detail with the patient. Please refer to After Visit Summary for other counseling recommendations.  Return in about 2 weeks (around 09/20/2020) for in person ROB - CNM .  Nolene Bernheim, RN, MSN, NP-BC Nurse Practitioner, [facility] for [employer], [facility] 09/06/2020 4:47 PM

## 2020-09-12 ENCOUNTER — Other Ambulatory Visit: Payer: Self-pay

## 2020-09-12 ENCOUNTER — Encounter: Payer: Medicaid Other | Attending: Obstetrics and Gynecology | Admitting: Registered"

## 2020-09-12 ENCOUNTER — Ambulatory Visit: Payer: Medicaid Other | Admitting: Registered"

## 2020-09-12 DIAGNOSIS — O24419 Gestational diabetes mellitus in pregnancy, unspecified control: Secondary | ICD-10-CM | POA: Insufficient documentation

## 2020-09-12 DIAGNOSIS — Z3480 Encounter for supervision of other normal pregnancy, unspecified trimester: Secondary | ICD-10-CM

## 2020-09-12 MED ORDER — ACCU-CHEK FASTCLIX LANCETS MISC: 1.0000 | Freq: Four times a day (QID) | 12 refills | Status: DC

## 2020-09-12 MED ORDER — GLUCOSE BLOOD VI STRP: ORAL_STRIP | 12 refills | Status: DC

## 2020-09-12 NOTE — Progress Notes (Signed)
Blood glucose strips and lancets sent to pharmacy on file per [facility].  Judeth Cornfield, RN

## 2020-09-13 NOTE — Progress Notes (Signed)
Patient was seen for Gestational Diabetes self-management on 09/12/2020  Start time 1120 and End time 1200   Estimated due date: 11/17/20; [redacted]w[redacted]d  Clinical: Medications: revewed Medical History: GDM Labs: OGTT elevated FBS, 1 hr, A1c 4.4%   Dietary and Lifestyle History: Pt states she remembers a lot of the education from her last experience with GDM. Pt states she does not like sweets, but likes many starches such as fufu and rice.  Pt states she has issues with gas in this pregnancy and chews on a dental stick that helps with nausea and bad taste in her mouth. Pt states she does not eat much dairy, eats green leafy vegetables daily, beans (and rice) 3-4x/week.  Physical Activity: Stress: Sleep:  24 hr Recall: First Meal: coffee, eggs Snack: Second meal: rice pancake Snack: Third meal: fish, lamb or chicken, vegetables, rice Snack: Beverages: water  NUTRITION INTERVENTION  Nutrition education (E-1) on the following topics:   Initial Follow-up  [x]  []  Definition of Gestational Diabetes []  []  Why dietary management is important in controlling blood glucose [x]  []  Effects each nutrient has on blood glucose levels []  []  Simple carbohydrates vs complex carbohydrates []  []  Fluid intake [x]  []  Creating a balanced meal plan [x]  []  Carbohydrate counting  [x]  []  When to check blood glucose levels [x]  []  Proper blood glucose monitoring techniques [x]  []  Effect of stress and stress reduction techniques  [x]  []  Exercise effect on blood glucose levels, appropriate exercise during pregnancy [x]  []  Importance of limiting caffeine and abstaining from alcohol and smoking [x]  []  Medications used for blood sugar control during pregnancy [x]  []  Hypoglycemia and rule of 15 [x]  []  Postpartum self care  Blood glucose monitor given: Accu-chek Guide Me Lot Exp: 06/02/2021 CBG: 88 mg/dL   Patient instructed to monitor glucose levels: FBS: 60 - ? 95 mg/dL (some clinics use 90 for  cutoff) 1 hour: ? 140 mg/dL 2 hour: ? mg/dL  Patient received handouts: Nutrition Diabetes and Pregnancy Carbohydrate Counting List  Patient will be seen for follow-up as needed.

## 2020-09-18 ENCOUNTER — Ambulatory Visit (HOSPITAL_BASED_OUTPATIENT_CLINIC_OR_DEPARTMENT_OTHER): Payer: Medicaid Other | Admitting: Obstetrics

## 2020-09-18 ENCOUNTER — Other Ambulatory Visit: Payer: Self-pay

## 2020-09-18 ENCOUNTER — Ambulatory Visit: Payer: Medicaid Other | Admitting: *Deleted

## 2020-09-18 ENCOUNTER — Other Ambulatory Visit: Payer: Self-pay | Admitting: *Deleted

## 2020-09-18 ENCOUNTER — Ambulatory Visit: Payer: Medicaid Other | Attending: Obstetrics and Gynecology

## 2020-09-18 VITALS — BP 116/68 | HR 99

## 2020-09-18 DIAGNOSIS — Z362 Encounter for other antenatal screening follow-up: Secondary | ICD-10-CM

## 2020-09-18 DIAGNOSIS — O24410 Gestational diabetes mellitus in pregnancy, diet controlled: Secondary | ICD-10-CM

## 2020-09-18 DIAGNOSIS — O99213 Obesity complicating pregnancy, third trimester: Secondary | ICD-10-CM | POA: Insufficient documentation

## 2020-09-18 DIAGNOSIS — O3663X0 Maternal care for excessive fetal growth, third trimester, not applicable or unspecified: Secondary | ICD-10-CM

## 2020-09-18 DIAGNOSIS — O09899 Supervision of other high risk pregnancies, unspecified trimester: Secondary | ICD-10-CM

## 2020-09-18 DIAGNOSIS — O24419 Gestational diabetes mellitus in pregnancy, unspecified control: Secondary | ICD-10-CM

## 2020-09-18 DIAGNOSIS — O09299 Supervision of pregnancy with other poor reproductive or obstetric history, unspecified trimester: Secondary | ICD-10-CM | POA: Insufficient documentation

## 2020-09-18 DIAGNOSIS — O99210 Obesity complicating pregnancy, unspecified trimester: Secondary | ICD-10-CM | POA: Diagnosis present

## 2020-09-18 DIAGNOSIS — Z3A31 31 weeks gestation of pregnancy: Secondary | ICD-10-CM

## 2020-09-18 DIAGNOSIS — O3662X0 Maternal care for excessive fetal growth, second trimester, not applicable or unspecified: Secondary | ICD-10-CM | POA: Insufficient documentation

## 2020-09-18 DIAGNOSIS — Z8632 Personal history of gestational diabetes: Secondary | ICD-10-CM | POA: Insufficient documentation

## 2020-09-18 NOTE — Progress Notes (Signed)
MFM Note  Morgan Mathews was seen for a follow up exam due to maternal obesity with a BMI of 40 and recently diagnosed diet-controlled gestational diabetes.  She was informed that the fetal growth continues to measure large for her gestational age (EFW 5 pounds 5 ounces at 31+ weeks, greater than 99th percentile for her gestational age).  Borderline polyhydramnios was noted.  The implications and management of diabetes in pregnancy was discussed in detail with the patient. She was advised that our goals for her fingerstick values are fasting values of 90-95 or less and two-hour postprandials of 120 or less.  Should her fingerstick values be above these values, she may have to be started on insulin or metformin to help her achieve better glycemic control. The patient was advised that getting her fingerstick values as close to these goals as possible would provide her with the most optimal obstetrical outcome.  The patient was advised that as the fetal growth is large and as borderline polyhydramnios is noted on today's exam, there is a possibility that her diabetes may not be adequately treated.  The patient reports that she is out of test strips and has not been checking her fingerstick values recently.  She was advised to contact your office as soon as possible to get a prescription for more test strips.  Due to the possibility that she may have uncontrolled gestational diabetes, we will start weekly BPP's next week.  Fetal movements were noted throughout today's exam.  A BPP was scheduled in 1 week.    The patient stated that all of her questions had been answered.  A total of 20 minutes was spent counseling and coordinating the care for this patient.  Greater than 50% of the time was spent in direct face-to-face contact.

## 2020-09-20 ENCOUNTER — Other Ambulatory Visit: Payer: Self-pay | Admitting: *Deleted

## 2020-09-20 DIAGNOSIS — O24419 Gestational diabetes mellitus in pregnancy, unspecified control: Secondary | ICD-10-CM

## 2020-09-20 DIAGNOSIS — O09899 Supervision of other high risk pregnancies, unspecified trimester: Secondary | ICD-10-CM

## 2020-09-20 MED ORDER — ACCU-CHEK SOFTCLIX LANCETS MISC: 12 refills | Status: DC

## 2020-09-20 MED ORDER — ACCU-CHEK GUIDE VI STRP: ORAL_STRIP | 12 refills | Status: DC

## 2020-09-26 ENCOUNTER — Other Ambulatory Visit: Payer: No Typology Code available for payment source

## 2020-09-26 ENCOUNTER — Encounter: Payer: No Typology Code available for payment source | Admitting: Family Medicine

## 2020-10-04 ENCOUNTER — Encounter: Payer: Self-pay | Admitting: Family Medicine

## 2020-10-04 ENCOUNTER — Ambulatory Visit (INDEPENDENT_AMBULATORY_CARE_PROVIDER_SITE_OTHER): Payer: Medicaid Other

## 2020-10-04 ENCOUNTER — Other Ambulatory Visit: Payer: Self-pay

## 2020-10-04 ENCOUNTER — Ambulatory Visit (INDEPENDENT_AMBULATORY_CARE_PROVIDER_SITE_OTHER): Payer: Medicaid Other | Admitting: Family Medicine

## 2020-10-04 ENCOUNTER — Ambulatory Visit (INDEPENDENT_AMBULATORY_CARE_PROVIDER_SITE_OTHER): Payer: Medicaid Other | Admitting: General Practice

## 2020-10-04 VITALS — BP 123/81 | HR 93 | Wt 262.0 lb

## 2020-10-04 DIAGNOSIS — O24419 Gestational diabetes mellitus in pregnancy, unspecified control: Secondary | ICD-10-CM

## 2020-10-04 DIAGNOSIS — O09899 Supervision of other high risk pregnancies, unspecified trimester: Secondary | ICD-10-CM

## 2020-10-04 MED ORDER — METFORMIN HCL 500 MG PO TABS: 500.0000 mg | ORAL_TABLET | Freq: Two times a day (BID) | ORAL | 2 refills | Status: DC

## 2020-10-04 MED ORDER — FAMOTIDINE 20 MG PO TABS: 20.0000 mg | ORAL_TABLET | Freq: Two times a day (BID) | ORAL | 2 refills | Status: DC

## 2020-10-04 NOTE — Progress Notes (Signed)
Pt informed that the ultrasound is considered a limited OB ultrasound and is not intended to be a complete ultrasound exam.  Patient also informed that the ultrasound is not being completed with the intent of assessing for fetal or placental anomalies or any pelvic abnormalities.  Explained that the purpose of today's ultrasound is to assess for  BPP, presentation, and AFI.  Patient acknowledges the purpose of the exam and the limitations of the study.     Haiden Clucas H RN BSN 10/04/20  

## 2020-10-04 NOTE — Progress Notes (Signed)
   Subjective:  Morgan Mathews is a 30 y.o. G4P3003 at [redacted]w[redacted]d being seen today for ongoing prenatal care.  She is currently monitored for the following issues for this high-risk pregnancy and has Nausea and vomiting in pregnancy; BMI 30.0-30.9,adult; Supervision of other high risk pregnancy, antepartum; History of gestational diabetes in prior pregnancy, currently pregnant; and Gestational diabetes mellitus (GDM) affecting pregnancy, antepartum on their problem list.  Patient reports no complaints.  Contractions: Not present.  .  Movement: Present. Denies leaking of fluid.   The following portions of the patient's history were reviewed and updated as appropriate: allergies, current medications, past family history, past medical history, past social history, past surgical history and problem list. Problem list updated.  Objective:  There were no vitals filed for this visit. Wt Readings from Last 3 Encounters:  10/04/20 262 lb (118.8 kg)  09/06/20 263 lb 8 oz (119.5 kg)  08/23/20 261 lb (118.4 kg)   Temp Readings from Last 3 Encounters:  01/20/20 99 F (37.2 C) (Oral)  08/05/19 (!) 97.3 F (36.3 C) (Temporal)  11/01/17 98 F (36.7 C) (Oral)   BP Readings from Last 3 Encounters:  10/04/20 123/81  09/18/20 116/68  09/06/20 121/63   Pulse Readings from Last 3 Encounters:  10/04/20 93  09/18/20 99  09/06/20 (!) 108     Fetal Status:     Movement: Present     General:  Alert, oriented and cooperative. Patient is in no acute distress.  Skin: Skin is warm and dry. No rash noted.   Cardiovascular: Normal heart rate noted  Respiratory: Normal respiratory effort, no problems with respiration noted  Abdomen: Soft, gravid, appropriate for gestational age.       Pelvic:       Cervical exam deferred        Extremities: Normal range of motion.     Mental Status: Normal mood and affect. Normal behavior. Normal judgment and thought content.   Urinalysis:      Assessment and Plan:   Pregnancy: G4P3003 at [redacted]w[redacted]d  1. Supervision of other high risk pregnancy, antepartum BP and FHR normal NST reactive and BPP 10/10  2. Gestational diabetes mellitus (GDM) affecting pregnancy, antepartum Overall not terrible control except for AM fastings which are almost 100% not at goal Post prandials only with scattered mildly elevated readings in 120's Start metformin 500mg  BID Antenatal testing reassuring, BPP 10/10 today  Preterm labor symptoms and general obstetric precautions including but not limited to vaginal bleeding, contractions, leaking of fluid and fetal movement were reviewed in detail with the patient. Please refer to After Visit Summary for other counseling recommendations.  Return for [facility], ob visit, needs MD.   [facility], MD

## 2020-10-11 ENCOUNTER — Ambulatory Visit (INDEPENDENT_AMBULATORY_CARE_PROVIDER_SITE_OTHER): Payer: Medicaid Other | Admitting: Obstetrics and Gynecology

## 2020-10-11 ENCOUNTER — Other Ambulatory Visit: Payer: Self-pay

## 2020-10-11 VITALS — BP 118/76 | HR 94 | Wt 263.2 lb

## 2020-10-11 DIAGNOSIS — Z3A34 34 weeks gestation of pregnancy: Secondary | ICD-10-CM

## 2020-10-11 DIAGNOSIS — O09899 Supervision of other high risk pregnancies, unspecified trimester: Secondary | ICD-10-CM

## 2020-10-11 DIAGNOSIS — O24419 Gestational diabetes mellitus in pregnancy, unspecified control: Secondary | ICD-10-CM

## 2020-10-11 DIAGNOSIS — Z6830 Body mass index (BMI) 30.0-30.9, adult: Secondary | ICD-10-CM

## 2020-10-11 DIAGNOSIS — O3663X0 Maternal care for excessive fetal growth, third trimester, not applicable or unspecified: Secondary | ICD-10-CM

## 2020-10-11 NOTE — Progress Notes (Signed)
    PRENATAL VISIT NOTE  Subjective:  Morgan Mathews is a 30 y.o. G4P3003 at [redacted]w[redacted]d being seen today for ongoing prenatal care.  She is currently monitored for the following issues for this high-risk pregnancy and has Nausea and vomiting in pregnancy; BMI 30.0-30.9,adult; Supervision of other high risk pregnancy, antepartum; Gestational diabetes mellitus (GDM) affecting pregnancy, antepartum; and Excessive fetal growth affecting management of mother in third trimester, antepartum on their problem list.  Patient reports no complaints.  Contractions: Not present. Vag. Bleeding: None.  Movement: Present. Denies leaking of fluid.   The following portions of the patient's history were reviewed and updated as appropriate: allergies, current medications, past family history, past medical history, past social history, past surgical history and problem list.   Objective:   Vitals:   10/11/20 1040  BP: 118/76  Pulse: 94  Weight: 263 lb 3.2 oz (119.4 kg)    Fetal Status: Fetal Heart Rate (bpm): 137   Movement: Present     General:  Alert, oriented and cooperative. Patient is in no acute distress.  Skin: Skin is warm and dry. No rash noted.   Cardiovascular: Normal heart rate noted  Respiratory: Normal respiratory effort, no problems with respiration noted  Abdomen: Soft, gravid, appropriate for gestational age.  Pain/Pressure: Present     Pelvic: Cervical exam deferred        Extremities: Normal range of motion.  Edema: None  Mental Status: Normal mood and affect. Normal behavior. Normal judgment and thought content.   Assessment and Plan:  Pregnancy: G4P3003 at [redacted]w[redacted]d 1. [redacted] weeks gestation of pregnancy GBS nv. Ask more about birth control nv  2. BMI 30.0-30.9,adult  3. Supervision of other high risk pregnancy, antepartum  4. GDM, class A2 Normal CBG log on metformin 500 bid. Continue current regimen BPP cancelled for today due to staffing issues. Pt has rpt bpp and u/s on 9/20 9/7  10/10, 15.17 afi 8/22: lga >99%, 2414gm, ac >99%, afi 24  5. Excessive fetal growth affecting management of pregnancy in third trimester, single or unspecified fetus Largest prior was 2019 39wk SVD of 3884gm. Pt did have GDMa1 and pt was >90% 4048gm, AC >97%, AFI 21 10 days prior to that delivery  Preterm labor symptoms and general obstetric precautions including but not limited to vaginal bleeding, contractions, leaking of fluid and fetal movement were reviewed in detail with the patient. Please refer to After Visit Summary for other counseling recommendations.   No follow-ups on file.  Future Appointments  Date Time Provider Department Center  10/17/2020  9:55 AM Madlyn Frankel [facility]  10/17/2020 11:00 AM [facility] NURSE [facility]  10/17/2020 11:15 AM [facility] US2 [facility]    Millerton Bing, MD

## 2020-10-17 ENCOUNTER — Encounter: Payer: Self-pay | Admitting: *Deleted

## 2020-10-17 ENCOUNTER — Ambulatory Visit: Payer: Medicaid Other | Admitting: *Deleted

## 2020-10-17 ENCOUNTER — Other Ambulatory Visit: Payer: Self-pay

## 2020-10-17 ENCOUNTER — Other Ambulatory Visit: Payer: Self-pay | Admitting: *Deleted

## 2020-10-17 ENCOUNTER — Ambulatory Visit: Payer: Medicaid Other | Attending: Obstetrics

## 2020-10-17 ENCOUNTER — Ambulatory Visit (INDEPENDENT_AMBULATORY_CARE_PROVIDER_SITE_OTHER): Payer: Medicaid Other | Admitting: Student

## 2020-10-17 ENCOUNTER — Ambulatory Visit (HOSPITAL_BASED_OUTPATIENT_CLINIC_OR_DEPARTMENT_OTHER): Payer: Medicaid Other | Admitting: Obstetrics and Gynecology

## 2020-10-17 VITALS — BP 115/69 | HR 103

## 2020-10-17 VITALS — BP 114/70 | HR 101 | Wt 264.0 lb

## 2020-10-17 DIAGNOSIS — O99213 Obesity complicating pregnancy, third trimester: Secondary | ICD-10-CM

## 2020-10-17 DIAGNOSIS — E669 Obesity, unspecified: Secondary | ICD-10-CM | POA: Diagnosis not present

## 2020-10-17 DIAGNOSIS — O24419 Gestational diabetes mellitus in pregnancy, unspecified control: Secondary | ICD-10-CM

## 2020-10-17 DIAGNOSIS — O24415 Gestational diabetes mellitus in pregnancy, controlled by oral hypoglycemic drugs: Secondary | ICD-10-CM

## 2020-10-17 DIAGNOSIS — O3663X0 Maternal care for excessive fetal growth, third trimester, not applicable or unspecified: Secondary | ICD-10-CM

## 2020-10-17 DIAGNOSIS — O09899 Supervision of other high risk pregnancies, unspecified trimester: Secondary | ICD-10-CM | POA: Diagnosis present

## 2020-10-17 DIAGNOSIS — Z3A35 35 weeks gestation of pregnancy: Secondary | ICD-10-CM | POA: Diagnosis not present

## 2020-10-17 NOTE — Progress Notes (Signed)
   PRENATAL VISIT NOTE  Subjective:  Morgan Mathews is a 30 y.o. G4P3003 at [redacted]w[redacted]d being seen today for ongoing prenatal care.  She is currently monitored for the following issues for this high-risk pregnancy and has Nausea and vomiting in pregnancy; BMI 30.0-30.9,adult; Supervision of other high risk pregnancy, antepartum; Gestational diabetes mellitus (GDM) affecting pregnancy, antepartum; and Excessive fetal growth affecting management of mother in third trimester, antepartum on their problem list. She takes metformin 500 mg twice a day plus pepcid twice a day.   Patient reports  feeling some pressure .  Contractions: Not present. Vag. Bleeding: None.  Movement: Present. Denies leaking of fluid.   The following portions of the patient's history were reviewed and updated as appropriate: allergies, current medications, past family history, past medical history, past social history, past surgical history and problem list.   Objective:   Vitals:   10/17/20 0955  BP: 114/70  Pulse: (!) 101  Weight: 264 lb (119.7 kg)    Fetal Status: Fetal Heart Rate (bpm): 131   Movement: Present     General:  Alert, oriented and cooperative. Patient is in no acute distress.  Skin: Skin is warm and dry. No rash noted.   Cardiovascular: Normal heart rate noted  Respiratory: Normal respiratory effort, no problems with respiration noted  Abdomen: Soft, gravid, appropriate for gestational age.  Pain/Pressure: Present     Pelvic: Cervical exam deferred        Extremities: Normal range of motion.  Edema: None  Mental Status: Normal mood and affect. Normal behavior. Normal judgment and thought content.   Assessment and Plan:  Pregnancy: G4P3003 at [redacted]w[redacted]d 1. Gestational diabetes mellitus (GDM) affecting pregnancy, antepartum -Growth scan today -Reviewed sugar log since 10-11-2020 and fastsing all over in 83-107 but overall in the 80s, nbreakst and lunch and dinner all under 120; keep up the great work!!   2.  Supervision of other high risk pregnancy, antepartum -patient doing well -anticipatory guidance for GBS/GC CT next visit  Preterm labor symptoms and general obstetric precautions including but not limited to vaginal bleeding, contractions, leaking of fluid and fetal movement were reviewed in detail with the patient. Please refer to After Visit Summary for other counseling recommendations.   Return in about 2 weeks (around 10/31/2020), or HROB with female only.  Future Appointments  Date Time Provider Department Center  10/17/2020 11:00 AM [facility] NURSE [facility]  10/17/2020 11:15 AM [facility]    Marylene Land, CNM

## 2020-10-17 NOTE — Progress Notes (Signed)
Maternal-Fetal Medicine   Name: Morgan Mathews DOB: Nov 26, 1990 MRN: [id number] Referring Provider: Marylene Land, CNM   I had the pleasure of seeing Ms. Glaspy today at the [facility].  She returned for fetal growth assessment and antenatal testing. She has gestational diabetes and takes metformin 500 mg twice daily.  I reviewed her blood glucose log of last 1 week.  2 of her fasting levels are slightly elevated (99 and 107 mg/DL).  Postprandial levels are within normal range.  Patient does not have any side effects to metformin. Obstetric history significant for 3 term vaginal deliveries all her children weighed more than 8 pounds at birth.  Her most recent delivery was in 2019 and none of her deliveries were complicated by shoulder dystocia. On today's ultrasound, amniotic fluid is normal and good fetal activity seen.  Estimated fetal weight is at the 99th percentile.  Antenatal testing is reassuring.  BPP 8/8.  Cephalic presentation. Ultrasound has limitations in accurately estimating fetal weights.  I emphasized the importance of good blood glucose control to prevent fetal or neonatal adverse outcomes.  Metformin dosage may be increased to 1,000 mg at night. We discussed timing of delivery.  If diabetes is well controlled, she can be delivered at [redacted] weeks gestation.  If control is suboptimal, early term delivery ([redacted] weeks gestation) may be appropriate. Recommendations -Continue weekly BPP or NST till delivery. -Consider increasing metformin dose is 1,000 mg at night.  Thank you for consultation.  If you have any questions or concerns, please contact me the [facility].  Consultation including face-to-face (more than 50%) counseling 30 minutes.

## 2020-10-23 ENCOUNTER — Ambulatory Visit (INDEPENDENT_AMBULATORY_CARE_PROVIDER_SITE_OTHER): Payer: Medicaid Other

## 2020-10-23 ENCOUNTER — Ambulatory Visit: Payer: No Typology Code available for payment source | Admitting: *Deleted

## 2020-10-23 ENCOUNTER — Other Ambulatory Visit: Payer: Self-pay

## 2020-10-23 VITALS — BP 125/73 | HR 87 | Wt 260.5 lb

## 2020-10-23 DIAGNOSIS — O99213 Obesity complicating pregnancy, third trimester: Secondary | ICD-10-CM

## 2020-10-23 DIAGNOSIS — O24415 Gestational diabetes mellitus in pregnancy, controlled by oral hypoglycemic drugs: Secondary | ICD-10-CM

## 2020-10-23 NOTE — Progress Notes (Signed)

## 2020-10-27 ENCOUNTER — Other Ambulatory Visit: Payer: Self-pay

## 2020-10-27 ENCOUNTER — Encounter (HOSPITAL_COMMUNITY): Payer: Self-pay | Admitting: Obstetrics & Gynecology

## 2020-10-27 ENCOUNTER — Inpatient Hospital Stay (HOSPITAL_COMMUNITY)
Admission: AD | Admit: 2020-10-27 | Discharge: 2020-10-27 | Disposition: A | Discharge status: Home / Self Care | Payer: Medicaid Other | Attending: Obstetrics & Gynecology | Admitting: Obstetrics & Gynecology

## 2020-10-27 DIAGNOSIS — O99613 Diseases of the digestive system complicating pregnancy, third trimester: Secondary | ICD-10-CM | POA: Diagnosis not present

## 2020-10-27 DIAGNOSIS — R059 Cough, unspecified: Secondary | ICD-10-CM | POA: Diagnosis not present

## 2020-10-27 DIAGNOSIS — O26893 Other specified pregnancy related conditions, third trimester: Secondary | ICD-10-CM

## 2020-10-27 DIAGNOSIS — Z3A37 37 weeks gestation of pregnancy: Secondary | ICD-10-CM | POA: Insufficient documentation

## 2020-10-27 DIAGNOSIS — R1010 Upper abdominal pain, unspecified: Secondary | ICD-10-CM | POA: Diagnosis present

## 2020-10-27 DIAGNOSIS — R12 Heartburn: Secondary | ICD-10-CM | POA: Insufficient documentation

## 2020-10-27 DIAGNOSIS — R1084 Generalized abdominal pain: Secondary | ICD-10-CM | POA: Diagnosis not present

## 2020-10-27 LAB — URINALYSIS, ROUTINE W REFLEX MICROSCOPIC
Glucose, UA: NEGATIVE mg/dL
Ketones, ur: 5 mg/dL — AB
Nitrite: NEGATIVE
Protein, ur: 30 mg/dL — AB
Specific Gravity, Urine: 1.027 (ref 1.005–1.030)
Squamous Epithelial / HPF: >50 — ABNORMAL HIGH (ref 0–5)
WBC, UA: >50 WBC/hpf — ABNORMAL HIGH (ref 0–5)
pH: 5 (ref 5.0–8.0)

## 2020-10-27 MED ORDER — LIDOCAINE VISCOUS HCL 2 % MT SOLN
15.0000 mL | Freq: Once | OROMUCOSAL | Status: AC
  Administered 2020-10-27: 15 mL via ORAL
  Filled 2020-10-27: qty 15

## 2020-10-27 MED ORDER — ALUM & MAG HYDROXIDE-SIMETH 200-200-20 MG/5ML PO SUSP
30.0000 mL | Freq: Once | ORAL | Status: AC
  Administered 2020-10-27: 30 mL via ORAL
  Filled 2020-10-27: qty 30

## 2020-10-27 MED ORDER — FAMOTIDINE 20 MG PO TABS
20.0000 mg | ORAL_TABLET | Freq: Once | ORAL | Status: AC
  Administered 2020-10-27: 20 mg via ORAL
  Filled 2020-10-27: qty 1

## 2020-10-27 NOTE — MAU Provider Note (Signed)
History     CSN: [id number]  Arrival date and time: 10/27/20 1738   Event Date/Time   First Provider Initiated Contact with Patient 10/27/20 1835      Chief Complaint  Patient presents with   Abdominal Pain   HPI Morgan Mathews is a 30 y.o. G4P3003 at [redacted]w[redacted]d who presents with upper abdominal pain. She states she has a burning sensation last night and it has gotten worse. She did not take her pepcid yesterday that she has been taking for weeks. She denies any contractions, vaginal bleeding or discharge. Reports normal fetal movement.   OB History     Gravida  4   Para  3   Term  3   Preterm  0   AB  0   Living  3      SAB  0   IAB  0   Ectopic  0   Multiple  0   Live Births  3           Past Medical History:  Diagnosis Date   Chronic bilateral thoracic back pain 08/28/2016   Gestational diabetes    Strabismus 1994   Right exotropia    Past Surgical History:  Procedure Laterality Date   NO PAST SURGERIES      History reviewed. No pertinent family history.  Social History   Tobacco Use   Smoking status: Never   Smokeless tobacco: Never  Vaping Use   Vaping Use: Never used  Substance Use Topics   Alcohol use: No   Drug use: No    Allergies: No Known Allergies  Medications Prior to Admission  Medication Sig Dispense Refill Last Dose   famotidine (PEPCID) 20 MG tablet Take 1 tablet (20 mg total) by mouth 2 (two) times daily. 60 tablet 2 10/26/2020   metFORMIN (GLUCOPHAGE) 500 MG tablet Take 1 tablet (500 mg total) by mouth 2 (two) times daily with a meal. 60 tablet 2 10/26/2020   prenatal vitamin w/FE, FA (PRENATAL 1 + 1) 27-1 MG TABS tablet Take 1 tablet by mouth daily at 12 noon.   10/26/2020   Accu-Chek FastClix Lancets MISC 1 Device by Percutaneous route 4 (four) times daily. 100 each 12    Accu-Chek Softclix Lancets lancets Use four times per day 100 each 12    Blood Pressure Monitoring DEVI 1 each by Does not apply route once a week. 1  each 0    glucose blood (ACCU-CHEK GUIDE) test strip Use four times per day 100 each 12     Review of Systems  Constitutional: Negative.  Negative for fatigue and fever.  HENT: Negative.    Respiratory: Negative.  Negative for shortness of breath.   Cardiovascular: Negative.  Negative for chest pain.  Gastrointestinal:  Positive for abdominal pain. Negative for constipation, diarrhea, nausea and vomiting.  Genitourinary: Negative.  Negative for dysuria, vaginal bleeding and vaginal discharge.  Neurological: Negative.  Negative for dizziness and headaches.  Physical Exam   Blood pressure 117/72, pulse 97, temperature 98.3 F (36.8 C), temperature source Oral, resp. rate 19, last menstrual period 02/11/2020, SpO2 98 %.  Physical Exam Vitals and nursing note reviewed.  Constitutional:      General: She is not in acute distress.    Appearance: She is well-developed.  HENT:     Head: Normocephalic.  Eyes:     Pupils: Pupils are equal, round, and reactive to light.  Cardiovascular:     Rate and Rhythm: Normal rate and  regular rhythm.     Heart sounds: Normal heart sounds.  Pulmonary:     Effort: Pulmonary effort is normal. No respiratory distress.     Breath sounds: Normal breath sounds.  Abdominal:     General: Bowel sounds are normal. There is no distension.     Palpations: Abdomen is soft.     Tenderness: There is no abdominal tenderness.  Skin:    General: Skin is warm and dry.  Neurological:     Mental Status: She is alert and oriented to person, place, and time.  Psychiatric:        Mood and Affect: Mood normal.        Behavior: Behavior normal.        Thought Content: Thought content normal.        Judgment: Judgment normal.   Fetal Tracing:  Baseline: 130 Variability: moderate Accels: 15x15 Decels: none  Toco: ui  Dilation: Closed Effacement (%): Thick Exam by:: Fabiola Backer, RN   MAU Course  Procedures Results for orders placed or performed  during the hospital encounter of 10/27/20 (from the past 24 hour(s))  Urinalysis, Routine w reflex microscopic Urine, Clean Catch     Status: Abnormal   Collection Time: 10/27/20  5:55 PM  Result Value Ref Range   Color, Urine AMBER (A) YELLOW   APPearance CLOUDY (A) CLEAR   Specific Gravity, Urine 1.027 1.005 - 1.030   pH 5.0 5.0 - 8.0   Glucose, UA NEGATIVE NEGATIVE mg/dL   Hgb urine dipstick SMALL (A) NEGATIVE   Bilirubin Urine SMALL (A) NEGATIVE   Ketones, ur 5 (A) NEGATIVE mg/dL   Protein, ur 30 (A) NEGATIVE mg/dL   Nitrite NEGATIVE NEGATIVE   Leukocytes,Ua SMALL (A) NEGATIVE   RBC / HPF 21-50 0 - 5 RBC/hpf   WBC, UA >50 (H) 0 - 5 WBC/hpf   Bacteria, UA MANY (A) NONE SEEN   Squamous Epithelial / LPF >50 (H) 0 - 5   Mucus PRESENT    Non Squamous Epithelial 0-5 (A) NONE SEEN    MDM UA- very dirty catch GI cocktail Pepcid No signs of labor at this time  Assessment and Plan   1. Heartburn during pregnancy in third trimester   2. [redacted] weeks gestation of pregnancy    -Discharge home in stable condition -Encouraged patient to continue pepcid -Labor precautions discussed -Patient advised to follow-up with OB as scheduled for prenatal care -Patient may return to MAU as needed or if her condition were to change or worsen   Rolm Bookbinder CNM 10/27/2020, 6:35 PM

## 2020-10-27 NOTE — MAU Note (Addendum)
...  Morgan Mathews is a 30 y.o. at [redacted]w[redacted]d here in MAU reporting: upper abdominal pain that began around 1700. She states the pain felt tight and she would have to "hold it" in order for it to get better. She is also endorsing abdominal tightness that occurs during her pain. She has a new onset cough that began today as well as a HA. +FM. No VB or LOF.   Pain score:  6/10 upper abdominal pain 9/10 HA  FHT: 155 initial  Lab orders placed from triage:  UA

## 2020-10-27 NOTE — Discharge Instructions (Signed)

## 2020-11-01 ENCOUNTER — Ambulatory Visit (INDEPENDENT_AMBULATORY_CARE_PROVIDER_SITE_OTHER): Payer: Medicaid Other | Admitting: Obstetrics & Gynecology

## 2020-11-01 ENCOUNTER — Other Ambulatory Visit (HOSPITAL_COMMUNITY)
Admission: RE | Admit: 2020-11-01 | Discharge: 2020-11-01 | Disposition: A | Discharge status: Home / Self Care | Payer: Medicaid Other | Source: Ambulatory Visit | Attending: Certified Nurse Midwife | Admitting: Certified Nurse Midwife

## 2020-11-01 ENCOUNTER — Ambulatory Visit (INDEPENDENT_AMBULATORY_CARE_PROVIDER_SITE_OTHER): Payer: Medicaid Other | Admitting: *Deleted

## 2020-11-01 ENCOUNTER — Other Ambulatory Visit: Payer: Self-pay | Admitting: Advanced Practice Midwife

## 2020-11-01 ENCOUNTER — Other Ambulatory Visit: Payer: Self-pay

## 2020-11-01 ENCOUNTER — Ambulatory Visit (INDEPENDENT_AMBULATORY_CARE_PROVIDER_SITE_OTHER): Payer: Medicaid Other

## 2020-11-01 VITALS — BP 125/82 | HR 99 | Wt 258.1 lb

## 2020-11-01 DIAGNOSIS — O99213 Obesity complicating pregnancy, third trimester: Secondary | ICD-10-CM | POA: Diagnosis not present

## 2020-11-01 DIAGNOSIS — Z3A37 37 weeks gestation of pregnancy: Secondary | ICD-10-CM | POA: Insufficient documentation

## 2020-11-01 DIAGNOSIS — O3663X0 Maternal care for excessive fetal growth, third trimester, not applicable or unspecified: Secondary | ICD-10-CM | POA: Insufficient documentation

## 2020-11-01 DIAGNOSIS — O219 Vomiting of pregnancy, unspecified: Secondary | ICD-10-CM | POA: Insufficient documentation

## 2020-11-01 DIAGNOSIS — O26893 Other specified pregnancy related conditions, third trimester: Secondary | ICD-10-CM

## 2020-11-01 DIAGNOSIS — O24415 Gestational diabetes mellitus in pregnancy, controlled by oral hypoglycemic drugs: Secondary | ICD-10-CM | POA: Diagnosis not present

## 2020-11-01 DIAGNOSIS — R12 Heartburn: Secondary | ICD-10-CM | POA: Diagnosis not present

## 2020-11-01 DIAGNOSIS — O24419 Gestational diabetes mellitus in pregnancy, unspecified control: Secondary | ICD-10-CM | POA: Insufficient documentation

## 2020-11-01 LAB — OB RESULTS CONSOLE GC/CHLAMYDIA: Gonorrhea: NEGATIVE

## 2020-11-01 LAB — GLUCOSE, CAPILLARY: Glucose-Capillary: 89 mg/dL (ref 70–99)

## 2020-11-01 MED ORDER — ONDANSETRON 4 MG PO TBDP: 4.0000 mg | ORAL_TABLET | Freq: Four times a day (QID) | ORAL | 0 refills | Status: DC | PRN

## 2020-11-01 MED ORDER — PANTOPRAZOLE SODIUM 40 MG PO TBEC: 40.0000 mg | DELAYED_RELEASE_TABLET | Freq: Every day | ORAL | 2 refills | Status: DC

## 2020-11-01 NOTE — Progress Notes (Signed)
   PRENATAL VISIT NOTE  Subjective:  Morgan Mathews is a 30 y.o. G4P3003 at [redacted]w[redacted]d being seen today for ongoing prenatal care.  She is currently monitored for the following issues for this high-risk pregnancy and has Nausea and vomiting in pregnancy; BMI 30.0-30.9,adult; Supervision of other high risk pregnancy, antepartum; Gestational diabetes mellitus (GDM) affecting pregnancy, antepartum; and Excessive fetal growth affecting management of mother in third trimester, antepartum on their problem list.  Patient reports heartburn and nausea, not alleviated by Pepcid.  Also has not checked BS in 3 days due to her heartburn and not eating much. Did not bring log or meter.  Contractions: Not present. Vag. Bleeding: None.  Movement: Present. Denies leaking of fluid.   The following portions of the patient's history were reviewed and updated as appropriate: allergies, current medications, past family history, past medical history, past social history, past surgical history and problem list.   Objective:   Vitals:   11/01/20 0940  BP: 125/82  Pulse: 99  Weight: 258 lb 1.6 oz (117.1 kg)    Fetal Status: Fetal Heart Rate (bpm): 140   Movement: Present     General:  Alert, oriented and cooperative. Patient is in no acute distress.  Skin: Skin is warm and dry. No rash noted.   Cardiovascular: Normal heart rate noted  Respiratory: Normal respiratory effort, no problems with respiration noted  Abdomen: Soft, gravid, appropriate for gestational age.  Pain/Pressure: Present     Pelvic: Cervical exam deferred   but pelvic cultures done in presence of chaperone.     Extremities: Normal range of motion.  Edema: None  Mental Status: Normal mood and affect. Normal behavior. Normal judgment and thought content.   Assessment and Plan:  Pregnancy: G4P3003 at 104w5d 1. Nausea and vomiting in pregnancy Zofran prescribed for nausea. - ondansetron (ZOFRAN ODT) 4 MG disintegrating tablet; Take 1 tablet (4 mg  total) by mouth every 6 (six) hours as needed for nausea.  Dispense: 20 tablet; Refill: 0  2. Heartburn during pregnancy in third trimester Will change to Protonix for heartburn. - pantoprazole (PROTONIX) 40 MG tablet; Take 1 tablet (40 mg total) by mouth daily.  Dispense: 30 tablet; Refill: 2  3. Gestational diabetes mellitus (GDM) affecting pregnancy, antepartum 4. Excessive fetal growth affecting management of pregnancy in third trimester, single or unspecified fetus IOL scheduled at 39 weeks, patient told it may be moved up if noncompliant with checking B. Random BS here in office today was 89.  Orders signed and held. Scheduled for 11/10/20 morning.  Continue recommended antenatal testing and scans.   5. [redacted] weeks gestation of pregnancy Pelvic cultures done today, will follow up results and manage accordingly. - Culture, beta strep (group b only) - GC/Chlamydia probe amp ([place])not at [facility] Preterm labor symptoms and general obstetric precautions including but not limited to vaginal bleeding, contractions, leaking of fluid and fetal movement were reviewed in detail with the patient. Please refer to After Visit Summary for other counseling recommendations.   Return in about 1 week (around 11/08/2020) for NST, BPP, OFFICE OB VISIT (MD only).  Future Appointments  Date Time Provider Department Center  11/01/2020 11:15 AM [facility] US1 [facility]  11/08/2020  8:15 AM [facility] NST [facility]  11/08/2020  9:15 AM Currie Paris, NP [facility]  11/10/2020  7:15 AM [facility] SCHED ROOM [facility] None    Jaynie Collins, MD

## 2020-11-01 NOTE — Patient Instructions (Signed)
Return to office for any scheduled appointments. Call the office or go to the MAU at [facility] if:  You begin to have strong, frequent contractions  Your water breaks.  Sometimes it is a big gush of fluid, sometimes it is just a trickle that keeps getting your panties wet or running down your legs  You have vaginal bleeding.  It is normal to have a small amount of spotting if your cervix was checked.   You do not feel your baby moving like normal.  If you do not, get something to eat and drink and lay down and focus on feeling your baby move.   If your baby is still not moving like normal, you should call the office or go to MAU.  Any other obstetric concerns.   

## 2020-11-02 LAB — GC/CHLAMYDIA PROBE AMP (~~LOC~~) NOT AT ARMC
Chlamydia: NEGATIVE
Comment: NEGATIVE
Comment: NORMAL
Neisseria Gonorrhea: NEGATIVE

## 2020-11-03 ENCOUNTER — Telehealth (HOSPITAL_COMMUNITY): Payer: Self-pay | Admitting: *Deleted

## 2020-11-03 ENCOUNTER — Encounter (HOSPITAL_COMMUNITY): Payer: Self-pay

## 2020-11-03 ENCOUNTER — Encounter (HOSPITAL_COMMUNITY): Payer: Self-pay | Admitting: *Deleted

## 2020-11-03 NOTE — Telephone Encounter (Signed)
Preadmission screen  

## 2020-11-04 LAB — CULTURE, BETA STREP (GROUP B ONLY): Strep Gp B Culture: POSITIVE — AB

## 2020-11-05 ENCOUNTER — Encounter: Payer: Self-pay | Admitting: Obstetrics & Gynecology

## 2020-11-05 DIAGNOSIS — Z22330 Carrier of Group B streptococcus: Secondary | ICD-10-CM | POA: Insufficient documentation

## 2020-11-05 DIAGNOSIS — O99820 Streptococcus B carrier state complicating pregnancy: Secondary | ICD-10-CM | POA: Insufficient documentation

## 2020-11-08 ENCOUNTER — Other Ambulatory Visit: Payer: Self-pay

## 2020-11-08 ENCOUNTER — Ambulatory Visit: Payer: No Typology Code available for payment source | Admitting: *Deleted

## 2020-11-08 ENCOUNTER — Ambulatory Visit (INDEPENDENT_AMBULATORY_CARE_PROVIDER_SITE_OTHER): Payer: Medicaid Other | Admitting: Nurse Practitioner

## 2020-11-08 ENCOUNTER — Ambulatory Visit (INDEPENDENT_AMBULATORY_CARE_PROVIDER_SITE_OTHER): Payer: Medicaid Other

## 2020-11-08 ENCOUNTER — Other Ambulatory Visit: Payer: Self-pay | Admitting: Obstetrics & Gynecology

## 2020-11-08 VITALS — BP 119/75 | HR 90 | Wt 262.7 lb

## 2020-11-08 DIAGNOSIS — O99213 Obesity complicating pregnancy, third trimester: Secondary | ICD-10-CM

## 2020-11-08 DIAGNOSIS — O3663X0 Maternal care for excessive fetal growth, third trimester, not applicable or unspecified: Secondary | ICD-10-CM

## 2020-11-08 DIAGNOSIS — O24415 Gestational diabetes mellitus in pregnancy, controlled by oral hypoglycemic drugs: Secondary | ICD-10-CM

## 2020-11-08 DIAGNOSIS — Z5941 Food insecurity: Secondary | ICD-10-CM

## 2020-11-08 DIAGNOSIS — O09899 Supervision of other high risk pregnancies, unspecified trimester: Secondary | ICD-10-CM

## 2020-11-08 DIAGNOSIS — O403XX0 Polyhydramnios, third trimester, not applicable or unspecified: Secondary | ICD-10-CM

## 2020-11-08 LAB — SARS CORONAVIRUS 2 (TAT 6-24 HRS): SARS Coronavirus 2: NEGATIVE

## 2020-11-08 NOTE — Progress Notes (Signed)
    Subjective:  Morgan Mathews is a 30 y.o. G4P3003 at [redacted]w[redacted]d being seen today for ongoing prenatal care.  She is currently monitored for the following issues for this high-risk pregnancy and has Nausea and vomiting in pregnancy; BMI 30.0-30.9,adult; Supervision of other high risk pregnancy, antepartum; Gestational diabetes mellitus (GDM) affecting pregnancy, antepartum; Excessive fetal growth affecting management of mother in third trimester, antepartum; and Group B Streptococcus carrier, +RV culture, currently pregnant on their problem list.  Patient reports no complaints.  Contractions: Irregular. Vag. Bleeding: None.  Movement: Present. Denies leaking of fluid.   The following portions of the patient's history were reviewed and updated as appropriate: allergies, current medications, past family history, past medical history, past social history, past surgical history and problem list. Problem list updated.  Objective:   Vitals:   11/08/20 0912  BP: 119/75  Pulse: 90  Weight: 262 lb 11.2 oz (119.2 kg)    Fetal Status: Fetal Heart Rate (bpm): RNST Fundal Height: 45 cm Movement: Present     General:  Alert, oriented and cooperative. Patient is in no acute distress.  Skin: Skin is warm and dry. No rash noted.   Cardiovascular: Normal heart rate noted  Respiratory: Normal respiratory effort, no problems with respiration noted  Abdomen: Soft, gravid, appropriate for gestational age. Pain/Pressure: Present     Pelvic:  Cervical exam deferred        Extremities: Normal range of motion.  Edema: None  Mental Status: Normal mood and affect. Normal behavior. Normal judgment and thought content.   Urinalysis:      Assessment and Plan:  Pregnancy: G4P3003 at [redacted]w[redacted]d  1. Supervision of other high risk pregnancy, antepartum States she does not want more children right now but is undecided on contraception.  Will think about and decide by postpartum visit Has induction scheduled on  11-10-20  2. Gestational diabetes mellitus (GDM) in third trimester controlled on oral hypoglycemic drug Thought she brought her diary of readings today but it was not in her purse as she thought.  3. Excessive fetal growth affecting management of pregnancy in third trimester, single or unspecified fetus   4. Obesity complicating pregnancy in third trimester   5. Polyhydramnios in third trimester complication, single or unspecified fetus Noted on NST today and reflected in increased fundal height.  6. Food insecurity Went to food market today.  - AMBULATORY REFERRAL TO [facility]  Term labor symptoms and general obstetric precautions including but not limited to vaginal bleeding, contractions, leaking of fluid and fetal movement were reviewed in detail with the patient. Please refer to After Visit Summary for other counseling recommendations.  Return in 5 weeks (on 12/13/2020) for postpartum check up - IOL on 10/14.  Nolene Bernheim, RN, MSN, NP-BC Nurse Practitioner, [facility] for [employer], [facility] 11/08/2020 9:49 AM

## 2020-11-08 NOTE — Progress Notes (Signed)
Polyhydramnios noted during BPP today.  IOL scheduled on 10/14

## 2020-11-10 ENCOUNTER — Inpatient Hospital Stay (HOSPITAL_COMMUNITY)
Admission: AD | Admit: 2020-11-10 | Discharge: 2020-11-12 | DRG: 806 | Disposition: A | Discharge status: Home / Self Care | Payer: Medicaid Other | Attending: Obstetrics & Gynecology | Admitting: Obstetrics & Gynecology

## 2020-11-10 ENCOUNTER — Other Ambulatory Visit: Payer: Self-pay | Admitting: Family Medicine

## 2020-11-10 ENCOUNTER — Inpatient Hospital Stay (HOSPITAL_COMMUNITY): Payer: Medicaid Other | Admitting: Anesthesiology

## 2020-11-10 ENCOUNTER — Other Ambulatory Visit: Payer: Self-pay

## 2020-11-10 ENCOUNTER — Inpatient Hospital Stay (HOSPITAL_COMMUNITY): Payer: Medicaid Other | Attending: Obstetrics & Gynecology

## 2020-11-10 ENCOUNTER — Inpatient Hospital Stay (HOSPITAL_COMMUNITY)
Admission: AD | Admit: 2020-11-10 | Payer: Medicaid Other | Source: Home / Self Care | Admitting: Obstetrics & Gynecology

## 2020-11-10 ENCOUNTER — Encounter (HOSPITAL_COMMUNITY): Payer: Self-pay | Admitting: Obstetrics and Gynecology

## 2020-11-10 ENCOUNTER — Inpatient Hospital Stay (HOSPITAL_COMMUNITY)
Admission: AD | Admit: 2020-11-10 | Payer: No Typology Code available for payment source | Source: Home / Self Care | Admitting: Obstetrics and Gynecology

## 2020-11-10 DIAGNOSIS — O329XX0 Maternal care for malpresentation of fetus, unspecified, not applicable or unspecified: Secondary | ICD-10-CM

## 2020-11-10 DIAGNOSIS — O9081 Anemia of the puerperium: Secondary | ICD-10-CM | POA: Diagnosis not present

## 2020-11-10 DIAGNOSIS — O9902 Anemia complicating childbirth: Secondary | ICD-10-CM | POA: Diagnosis not present

## 2020-11-10 DIAGNOSIS — O24419 Gestational diabetes mellitus in pregnancy, unspecified control: Secondary | ICD-10-CM | POA: Diagnosis present

## 2020-11-10 DIAGNOSIS — Z3A39 39 weeks gestation of pregnancy: Secondary | ICD-10-CM

## 2020-11-10 DIAGNOSIS — O99820 Streptococcus B carrier state complicating pregnancy: Secondary | ICD-10-CM

## 2020-11-10 DIAGNOSIS — O26893 Other specified pregnancy related conditions, third trimester: Secondary | ICD-10-CM | POA: Diagnosis not present

## 2020-11-10 DIAGNOSIS — O24429 Gestational diabetes mellitus in childbirth, unspecified control: Secondary | ICD-10-CM | POA: Diagnosis not present

## 2020-11-10 DIAGNOSIS — Z3A00 Weeks of gestation of pregnancy not specified: Secondary | ICD-10-CM | POA: Diagnosis not present

## 2020-11-10 DIAGNOSIS — O24425 Gestational diabetes mellitus in childbirth, controlled by oral hypoglycemic drugs: Principal | ICD-10-CM | POA: Diagnosis present

## 2020-11-10 DIAGNOSIS — O3663X0 Maternal care for excessive fetal growth, third trimester, not applicable or unspecified: Secondary | ICD-10-CM | POA: Diagnosis present

## 2020-11-10 DIAGNOSIS — O99824 Streptococcus B carrier state complicating childbirth: Secondary | ICD-10-CM | POA: Diagnosis present

## 2020-11-10 DIAGNOSIS — D62 Acute posthemorrhagic anemia: Secondary | ICD-10-CM | POA: Diagnosis not present

## 2020-11-10 DIAGNOSIS — D649 Anemia, unspecified: Secondary | ICD-10-CM | POA: Diagnosis not present

## 2020-11-10 DIAGNOSIS — O403XX0 Polyhydramnios, third trimester, not applicable or unspecified: Secondary | ICD-10-CM | POA: Diagnosis not present

## 2020-11-10 DIAGNOSIS — O24424 Gestational diabetes mellitus in childbirth, insulin controlled: Secondary | ICD-10-CM | POA: Diagnosis not present

## 2020-11-10 DIAGNOSIS — Z0289 Encounter for other administrative examinations: Secondary | ICD-10-CM

## 2020-11-10 DIAGNOSIS — Z22330 Carrier of Group B streptococcus: Secondary | ICD-10-CM

## 2020-11-10 DIAGNOSIS — O09899 Supervision of other high risk pregnancies, unspecified trimester: Secondary | ICD-10-CM

## 2020-11-10 LAB — CBC
HCT: 32 % — ABNORMAL LOW (ref 36.0–46.0)
Hemoglobin: 10 g/dL — ABNORMAL LOW (ref 12.0–15.0)
MCH: 29.3 pg (ref 26.0–34.0)
MCHC: 31.3 g/dL (ref 30.0–36.0)
MCV: 93.8 fL (ref 80.0–100.0)
Platelets: 186 10*3/uL (ref 150–400)
RBC: 3.41 MIL/uL — ABNORMAL LOW (ref 3.87–5.11)
RDW: 13.6 % (ref 11.5–15.5)
WBC: 5 10*3/uL (ref 4.0–10.5)
nRBC: 0 % (ref 0.0–0.2)

## 2020-11-10 LAB — GLUCOSE, CAPILLARY
Glucose-Capillary: 101 mg/dL — ABNORMAL HIGH (ref 70–99)
Glucose-Capillary: 88 mg/dL (ref 70–99)
Glucose-Capillary: 81 mg/dL (ref 70–99)
Glucose-Capillary: 108 mg/dL — ABNORMAL HIGH (ref 70–99)

## 2020-11-10 LAB — RPR: RPR Ser Ql: NONREACTIVE

## 2020-11-10 MED ORDER — SODIUM CHLORIDE 0.9 % IV SOLN
5.0000 10*6.[IU] | Freq: Once | INTRAVENOUS | Status: AC
  Administered 2020-11-10: 5 10*6.[IU] via INTRAVENOUS
  Filled 2020-11-10: qty 5

## 2020-11-10 MED ORDER — LACTATED RINGERS IV SOLN: 500.0000 mL | INTRAVENOUS | Status: DC | PRN

## 2020-11-10 MED ORDER — SOD CITRATE-CITRIC ACID 500-334 MG/5ML PO SOLN: 30.0000 mL | ORAL | Status: DC | PRN

## 2020-11-10 MED ORDER — MISOPROSTOL 25 MCG QUARTER TABLET: 25.0000 ug | ORAL_TABLET | ORAL | Status: DC | PRN

## 2020-11-10 MED ORDER — PHENYLEPHRINE 40 MCG/ML (10ML) SYRINGE FOR IV PUSH (FOR BLOOD PRESSURE SUPPORT)
80.0000 ug | PREFILLED_SYRINGE | INTRAVENOUS | Status: DC | PRN
Start: 2020-11-10 — End: 2020-11-10

## 2020-11-10 MED ORDER — FLEET ENEMA 7-19 GM/118ML RE ENEM: 1.0000 | ENEMA | Freq: Every day | RECTAL | Status: DC | PRN

## 2020-11-10 MED ORDER — LIDOCAINE HCL (PF) 1 % IJ SOLN
INTRAMUSCULAR | Status: DC | PRN
  Administered 2020-11-10: 8 mL via EPIDURAL

## 2020-11-10 MED ORDER — LIDOCAINE HCL (PF) 1 % IJ SOLN: 30.0000 mL | INTRAMUSCULAR | Status: DC | PRN

## 2020-11-10 MED ORDER — OXYCODONE-ACETAMINOPHEN 5-325 MG PO TABS: 2.0000 | ORAL_TABLET | ORAL | Status: DC | PRN

## 2020-11-10 MED ORDER — EPHEDRINE 5 MG/ML INJ: 10.0000 mg | INTRAVENOUS | Status: DC | PRN

## 2020-11-10 MED ORDER — ONDANSETRON HCL 4 MG/2ML IJ SOLN: 4.0000 mg | Freq: Four times a day (QID) | INTRAMUSCULAR | Status: DC | PRN

## 2020-11-10 MED ORDER — TERBUTALINE SULFATE 1 MG/ML IJ SOLN: 0.2500 mg | Freq: Once | INTRAMUSCULAR | Status: DC | PRN

## 2020-11-10 MED ORDER — OXYTOCIN-SODIUM CHLORIDE 30-0.9 UT/500ML-% IV SOLN
2.5000 [IU]/h | INTRAVENOUS | Status: DC
  Filled 2020-11-10: qty 500

## 2020-11-10 MED ORDER — FENTANYL CITRATE (PF) 100 MCG/2ML IJ SOLN: 50.0000 ug | INTRAMUSCULAR | Status: DC | PRN

## 2020-11-10 MED ORDER — FENTANYL-BUPIVACAINE-NACL 0.5-0.125-0.9 MG/250ML-% EP SOLN
12.0000 mL/h | EPIDURAL | Status: DC | PRN
  Administered 2020-11-10: 12 mL/h via EPIDURAL
  Filled 2020-11-10: qty 250

## 2020-11-10 MED ORDER — PHENYLEPHRINE 40 MCG/ML (10ML) SYRINGE FOR IV PUSH (FOR BLOOD PRESSURE SUPPORT): 80.0000 ug | PREFILLED_SYRINGE | INTRAVENOUS | Status: DC | PRN

## 2020-11-10 MED ORDER — PENICILLIN G POT IN DEXTROSE 60000 UNIT/ML IV SOLN
3.0000 10*6.[IU] | INTRAVENOUS | Status: DC
  Administered 2020-11-10 (×2): 3 10*6.[IU] via INTRAVENOUS
  Filled 2020-11-10 (×4): qty 50

## 2020-11-10 MED ORDER — OXYCODONE-ACETAMINOPHEN 5-325 MG PO TABS
1.0000 | ORAL_TABLET | ORAL | Status: DC | PRN
Start: 2020-11-10 — End: 2020-11-10

## 2020-11-10 MED ORDER — LACTATED RINGERS IV SOLN: 500.0000 mL | Freq: Once | INTRAVENOUS | Status: DC

## 2020-11-10 MED ORDER — LACTATED RINGERS IV SOLN: INTRAVENOUS | Status: DC

## 2020-11-10 MED ORDER — DIPHENHYDRAMINE HCL 50 MG/ML IJ SOLN
12.5000 mg | INTRAMUSCULAR | Status: DC | PRN
Start: 2020-11-10 — End: 2020-11-10

## 2020-11-10 MED ORDER — OXYTOCIN-SODIUM CHLORIDE 30-0.9 UT/500ML-% IV SOLN
1.0000 m[IU]/min | INTRAVENOUS | Status: DC
  Administered 2020-11-10: 2 m[IU]/min via INTRAVENOUS

## 2020-11-10 MED ORDER — HYDROXYZINE HCL 50 MG PO TABS
50.0000 mg | ORAL_TABLET | Freq: Four times a day (QID) | ORAL | Status: DC | PRN
  Filled 2020-11-10: qty 1

## 2020-11-10 MED ORDER — PENICILLIN G POTASSIUM 20000000 UNITS IJ SOLR
3.0000 10*6.[IU] | INTRAVENOUS | Status: DC
  Filled 2020-11-10 (×3): qty 3

## 2020-11-10 MED ORDER — ZOLPIDEM TARTRATE 5 MG PO TABS: 5.0000 mg | ORAL_TABLET | Freq: Every evening | ORAL | Status: DC | PRN

## 2020-11-10 MED ORDER — ACETAMINOPHEN 325 MG PO TABS: 650.0000 mg | ORAL_TABLET | ORAL | Status: DC | PRN

## 2020-11-10 MED ORDER — PENICILLIN G POTASSIUM 20000000 UNITS IJ SOLR
5.0000 10*6.[IU] | Freq: Once | INTRAVENOUS | Status: DC
  Filled 2020-11-10 (×2): qty 5

## 2020-11-10 MED ORDER — OXYTOCIN BOLUS FROM INFUSION
333.0000 mL | Freq: Once | INTRAVENOUS | Status: AC
Start: 2020-11-10 — End: 2020-11-10
  Administered 2020-11-10: 333 mL via INTRAVENOUS

## 2020-11-10 NOTE — Progress Notes (Addendum)
Morgan Mathews is a 30 y.o. G4P3003 at [redacted]w[redacted]d by LMP admitted for active labor  Subjective: Pt breathing through contractions, reports they are staying the same in intensity since admission.  S/O at home and will pick up other kids from school this afternoon before coming to the hospital. Pt reports she is aware that the baby may come before he is here.  Objective: BP 113/68   Pulse 90   Temp (!) 97.5 F (36.4 C) (Oral)   Resp 18   LMP 02/11/2020  No intake/output data recorded. No intake/output data recorded.  FHT:  FHR: 135 bpm, variability: moderate,  accelerations:  Present,  decelerations:  Absent UC:   regular, every 5-7 minutes SVE:   Dilation: 6.5 Effacement (%): 100 Station: -3 Exam by:: Morgan Mathews, cnm BBOW on exam  Labs: Lab Results  Component Value Date   WBC 5.0 11/10/2020   HGB 10.0 (L) 11/10/2020   HCT 32.0 (L) 11/10/2020   MCV 93.8 11/10/2020   PLT 186 11/10/2020    Assessment / Plan: Spontaneous labor, progressing normally  Labor:  Given pt GBS positive and PCN recently initiated, will expectantly manage at this time. After adequate GBS prophylaxis, if augmentation is needed, will offer AROM. Pt doing well, questions answered about pain management options. Preeclampsia:   n/a Fetal Wellbeing:  Category I Pain Control:  Labor support without medications I/D:   GBS positive Anticipated MOD:  NSVD  Sharen Counter 11/10/2020, 11:07 AM

## 2020-11-10 NOTE — Progress Notes (Signed)
Morgan Mathews is a 30 y.o. G4P3003 at [redacted]w[redacted]d admitted for active labor  Subjective: Pt feeling contractions, reports they are less painful and less frequent than when she was admitted this morning.   Objective: BP 128/78   Pulse 88   Temp 97.9 F (36.6 C) (Oral)   Resp 18   Ht 5\' 6"  (1.676 m)   Wt 117.9 kg   LMP 02/11/2020   BMI 41.97 kg/m  No intake/output data recorded. No intake/output data recorded.  FHT:  FHR: 135 bpm, variability: moderate,  accelerations:  Present,  decelerations:  Absent UC:   regular, every 10 minutes SVE:   Dilation: 7 Effacement (%): 100 Station: -3 Exam by:: Dortha Neighbors leftwich kirby AROM with clear fluid, pt tolerated well  Labs: Lab Results  Component Value Date   WBC 5.0 11/10/2020   HGB 10.0 (L) 11/10/2020   HCT 32.0 (L) 11/10/2020   MCV 93.8 11/10/2020   PLT 186 11/10/2020    Assessment / Plan: Protracted active phase  Labor:  Discussed options including expectant management, IV Pitocin, and/or AROM.  Pt PCN was started 4+ hours ago so has adequate tx for GBS.  Pt husband will be here in ~ 2 hours.  Offered to wait for augmentation until husband arrives but pt prefers to actively manage now with AROM.  AROM performed, see note above. Preeclampsia:   n/a Fetal Wellbeing:  Category I Pain Control:  Labor support without medications I/D:   GBS positive, on PCN Anticipated MOD:  NSVD  11/12/2020 11/10/2020, 2:21 PM

## 2020-11-10 NOTE — Progress Notes (Signed)
Labor Progress Note Morgan Mathews is a 30 y.o. G4P3003 at [redacted]w[redacted]d presented for active labor.   S: Feeling a lot of pressure and urge to push. Epidural in place.   O:  BP 124/80   Pulse 89   Temp 98 F (36.7 C) (Oral)   Resp 18   Ht 5\' 6"  (1.676 m)   Wt 117.9 kg   LMP 02/11/2020   SpO2 98%   BMI 41.97 kg/m  EFM: 120-130/mod/15x15/occasional variable with contractions   CVE: Dilation: 9 Effacement (%): 100 Cervical Position: Middle Station: -1 Presentation: Vertex Exam by:: E Chipps RN   A&P: 30 y.o. 37 [redacted]w[redacted]d  #Labor: Still about 9cm and involuntarily pushing, however around -2 station. Placed on her left lateral side with peanut to allow descent. Cervical checks frequently.  #Pain: Epidural  #FWB: Cat II with occasional variables with involuntary pushing/contractions  #GBS positive  #A2GDM: Associated with LGA. Will prepare for shoulder dystocia. CBGS appropriate.   [redacted]w[redacted]d, DO 8:45 PM

## 2020-11-10 NOTE — Discharge Summary (Addendum)
Postpartum Discharge Summary     Patient Name: Morgan Mathews DOB: 08-18-1990 MRN: [id number]  Date of admission: 11/10/2020 Delivery date:11/10/2020  Delivering provider: Patriciaann Clan  Date of discharge: 11/12/2020  Admitting diagnosis: Gestational diabetes mellitus (GDM) in third trimester [O24.419] Intrauterine pregnancy: [redacted]w[redacted]d    Secondary diagnosis:  Principal Problem:   Vaginal delivery Active Problems:   Group B Streptococcus carrier, +RV culture, currently pregnant   Gestational diabetes mellitus (GDM) in third trimester  Additional problems: Delayed PPH    Discharge diagnosis: Term Pregnancy Delivered and GDM A2                                              Post partum procedures: IV iron transfusion for acute blood loss anemia postpartum Augmentation: AROM and Pitocin Complications: None  Hospital course: Onset of Labor With Vaginal Delivery      30y.o. yo [id number] admitted in Latent Labor on 11/10/2020. Required Pitocin and AROM. Patient had an uncomplicated labor course as follows:  Membrane Rupture Time/Date: 2:12 PM ,11/10/2020   Delivery Method:Vaginal, Spontaneous  Episiotomy: None  Lacerations:  None  Patient had an uncomplicated postpartum course.  She is ambulating, tolerating a regular diet, passing flatus, and urinating well. Patient is discharged home in stable condition on 11/12/20.  Newborn Data: Birth date:11/10/2020  Birth time:9:47 PM  Gender:Female  Living status:Living  Apgars:7 ,9  Weight:4281 g   Magnesium Sulfate received: No BMZ received: No Rhophylac: No MMR: No T-DaP: Given prenatally Flu: No Transfusion: Yes, Venofer  Physical exam  Vitals:   11/11/20 0738 11/11/20 1300 11/11/20 2049 11/12/20 0540  BP: 113/69 113/70 114/67 (P) 100/71  Pulse: 80 97 82 (P) 78  Resp: 16 16 16  (P) 18  Temp: 98.5 F (36.9 C) 98 F (36.7 C) 98.1 F (36.7 C) (P) 98 F (36.7 C)  TempSrc: Oral Oral Oral (P) Oral  SpO2: 98%  100%    Weight:      Height:       General: alert, cooperative, and no distress Lochia: appropriate Uterine Fundus: firm Incision: N/A DVT Evaluation: No evidence of DVT seen on physical exam. Negative Homan's sign. No significant calf/ankle edema.  Labs: Lab Results  Component Value Date   WBC 8.4 11/11/2020   HGB 8.4 (L) 11/11/2020   HCT 26.6 (L) 11/11/2020   MCV 92.7 11/11/2020   PLT 169 11/11/2020   CMP Latest Ref Rng & Units 04/07/2017  Glucose 65 - 99 mg/dL 107(H)  BUN 6 - 20 mg/dL 8  Creatinine 0.44 - 1.00 mg/dL 0.56  Sodium 135 - 145 mmol/L 131(L)  Potassium 3.5 - 5.1 mmol/L 3.0(L)  Chloride 101 - 111 mmol/L 95(L)  CO2 22 - 32 mmol/L 20(L)  Calcium 8.9 - 10.3 mg/dL 9.8  Total Protein 6.5 - 8.1 g/dL 8.8(H)  Total Bilirubin 0.3 - 1.2 mg/dL 5.7(H)  Alkaline Phos 38 - 126 U/L 96  AST 15 - 41 U/L 450(H)  ALT 14 - 54 U/L 649(H)   EdFlavia Shippercore: Edinburgh Postnatal Depression Scale Screening Tool 11/12/2020  I have been able to laugh and see the funny side of things. 0  I have looked forward with enjoyment to things. 0  I have blamed myself unnecessarily when things went wrong. 0  I have been anxious or worried for no good reason.  0  I have felt scared or panicky for no good reason. 0  Things have been getting on top of me. 0  I have been so unhappy that I have had difficulty sleeping. 0  I have felt sad or miserable. 0  I have been so unhappy that I have been crying. 0  The thought of harming myself has occurred to me. 0  Edinburgh Postnatal Depression Scale Total 0     After visit meds:  Allergies as of 11/12/2020   No Known Allergies      Medication List     STOP taking these medications    Accu-Chek FastClix Lancets Misc   Accu-Chek Guide test strip Generic drug: glucose blood   Accu-Chek Softclix Lancets lancets   Blood Pressure Monitoring Devi   famotidine 20 MG tablet Commonly known as: Pepcid   metFORMIN 500 MG tablet Commonly  known as: Glucophage   ondansetron 4 MG disintegrating tablet Commonly known as: Zofran ODT   pantoprazole 40 MG tablet Commonly known as: Protonix       TAKE these medications    acetaminophen 325 MG tablet Commonly known as: Tylenol Take 2 tablets (650 mg total) by mouth every 4 (four) hours as needed (for pain scale < 4).   ibuprofen 600 MG tablet Commonly known as: ADVIL Take 1 tablet (600 mg total) by mouth every 6 (six) hours.   prenatal vitamin w/FE, FA 27-1 MG Tabs tablet Take 1 tablet by mouth daily at 12 noon.         Discharge home in stable condition Infant Feeding: Bottle and Breast Infant Disposition: home with mother Discharge instruction: per After Visit Summary and Postpartum booklet. Activity: Advance as tolerated. Pelvic rest for 6 weeks.  Diet: routine diet Future Appointments: Future Appointments  Date Time Provider [place]  12/13/2020  8:15 AM Gabriel Carina, CNM [facility]   Follow up Visit: Message sent by Dr. Higinio Plan to [facility] on 11/10/2020:   Please schedule this patient for a In person postpartum visit in 4 weeks with the following provider: Any provider. Additional Postpartum F/U: 2 hour GTT  High risk pregnancy complicated by: GDM Delivery mode:  Vaginal delivery  Anticipated Birth Control:  Unsure  11/12/2020 Gerlene Fee, DO  GME ATTESTATION:  I saw and evaluated the patient. I agree with the findings and the plan of care as documented in the resident's note. I have made changes to documentation as necessary.  Progressing well and meeting all postpartum milestones. VSS. Fasting glucose elevated postpartum at 134. Will need 2 hour GTT at postpartum visit. S/p Venofer infusion yesterday after PPH (Hgb 8.4). No signs/symptoms of anemia today. All questions and concerns addressed. Stable for discharge home.   Vilma Meckel, MD OB Fellow, [place] [facility] [place] 11/12/2020 2:13  PM

## 2020-11-10 NOTE — Progress Notes (Signed)
Vtx by BSUS 

## 2020-11-10 NOTE — Anesthesia Procedure Notes (Signed)
Epidural Patient location during procedure: OB Start time: 11/10/2020 7:00 PM End time: 11/10/2020 7:15 PM  Staffing Anesthesiologist: Mellody Dance, MD Performed: anesthesiologist   Preanesthetic Checklist Completed: patient identified, IV checked, site marked, risks and benefits discussed, monitors and equipment checked, pre-op evaluation and timeout performed  Epidural Patient position: sitting Prep: DuraPrep Patient monitoring: heart rate, cardiac monitor, continuous pulse ox and blood pressure Approach: midline Location: L2-L3 Injection technique: LOR saline  Needle:  Needle type: Tuohy  Needle gauge: 17 G Needle length: 9 cm Needle insertion depth: 7 cm Catheter type: closed end flexible Catheter size: 20 Guage Catheter at skin depth: 12 cm Test dose: negative and Other  Assessment Events: blood not aspirated, injection not painful, no injection resistance and negative IV test  Additional Notes Informed consent obtained prior to proceeding including risk of failure, 1% risk of PDPH, risk of minor discomfort and bruising.  Discussed rare but serious complications including epidural abscess, permanent nerve injury, epidural hematoma.  Discussed alternatives to epidural analgesia and patient desires to proceed.  Timeout performed pre-procedure verifying patient name, procedure, and platelet count.  Patient tolerated procedure well.

## 2020-11-10 NOTE — MAU Note (Signed)
Morgan Mathews is a 30 y.o. at [redacted]w[redacted]d here in MAU reporting: contractions started last night, they are now every 3 min. No bleeding. Unsure about LOF, states she has felt wet. Scheduled for IOL today.  Onset of complaint: last night

## 2020-11-10 NOTE — H&P (Addendum)
OBSTETRIC ADMISSION HISTORY AND PHYSICAL  Morgan Mathews is a 30 y.o. female [phone] with IUP at [redacted]w[redacted]d by LMP presenting for SOL. She reports +FMs, No LOF, no VB, no blurry vision, headaches or peripheral edema, and RUQ pain. She plans on breast and bottle feeding. She is undecided about birth control. She received her prenatal care at [facility]   Dating: By LMP --->  Estimated Date of Delivery: 11/17/20  Sono:    @[redacted]w[redacted]d , CWD, normal anatomy, cephalic presentation, placenta anterior, 3628g, >99% EFW  BPP: @[redacted]w[redacted]d , cephalic presentation, subjectively increased AFI, but still WNL at 23.7 (95%ile), score 10/10   Prenatal History/Complications: GDMA2, polyhydramnios, LGA, food insecurity  Past Medical History: Past Medical History:  Diagnosis Date   Chronic bilateral thoracic back pain 08/28/2016   Gestational diabetes    Strabismus 1994   Right exotropia    Past Surgical History: Past Surgical History:  Procedure Laterality Date   NO PAST SURGERIES      Obstetrical History: OB History     Gravida  4   Para  3   Term  3   Preterm  0   AB  0   Living  3      SAB  0   IAB  0   Ectopic  0   Multiple  0   Live Births  3           Social History Social History   Socioeconomic History   Marital status: Married    Spouse name: Musah Iddrisu   Number of children: 2   Years of education: 8   Highest education level: 12th grade  Occupational History   Not on file  Tobacco Use   Smoking status: Never   Smokeless tobacco: Never  Vaping Use   Vaping Use: Never used  Substance and Sexual Activity   Alcohol use: No   Drug use: No   Sexual activity: Yes    Birth control/protection: None  Other Topics Concern   Not on file  Social History Narrative   Originally from   Came to 10/28/2016. In 2015.   Lives at home with husband and 2 children.   Social Determinants of Health   Financial Resource Strain: Not on file  Food Insecurity: No Food Insecurity    Worried About [employer] in the Last Year: Never true   Ran Out of Food in the Last Year: Never true  Transportation Needs: No Transportation Needs   Lack of Transportation (Medical): No   Lack of Transportation (Non-Medical): No  Physical Activity: Not on file  Stress: Not on file  Social Connections: Not on file    Family History: Family History  Problem Relation Age of Onset   Diabetes Mother    Healthy Father     Allergies: No Known Allergies  Medications Prior to Admission  Medication Sig Dispense Refill Last Dose   metFORMIN (GLUCOPHAGE) 500 MG tablet Take 1 tablet (500 mg total) by mouth 2 (two) times daily with a meal. 60 tablet 2 11/10/2020   pantoprazole (PROTONIX) 40 MG tablet Take 1 tablet (40 mg total) by mouth daily. 30 tablet 2 11/10/2020   prenatal vitamin w/FE, FA (PRENATAL 1 + 1) 27-1 MG TABS tablet Take 1 tablet by mouth daily at 12 noon.   11/09/2020   Accu-Chek FastClix Lancets MISC 1 Device by Percutaneous route 4 (four) times daily. (Patient not taking: Reported on 11/08/2020) 100 each 12    Accu-Chek Softclix Lancets  lancets Use four times per day 100 each 12    Blood Pressure Monitoring DEVI 1 each by Does not apply route once a week. (Patient not taking: Reported on 11/08/2020) 1 each 0    famotidine (PEPCID) 20 MG tablet Take 1 tablet (20 mg total) by mouth 2 (two) times daily. 60 tablet 2    glucose blood (ACCU-CHEK GUIDE) test strip Use four times per day 100 each 12    ondansetron (ZOFRAN ODT) 4 MG disintegrating tablet Take 1 tablet (4 mg total) by mouth every 6 (six) hours as needed for nausea. (Patient not taking: No sig reported) 20 tablet 0 More than a month     Review of Systems   All systems reviewed and negative except as stated in HPI  Blood pressure 122/77, pulse (!) 108, temperature 97.9 F (36.6 C), temperature source Oral, resp. rate 20, last menstrual period 02/11/2020. General appearance: alert, cooperative, and no  distress Lungs: clear to auscultation bilaterally Heart: regular rate and rhythm Abdomen: soft, non-tender; bowel sounds normal Pelvic: n/a Extremities: No swelling, no sign of DVT Presentation: cephalic Fetal monitoringBaseline: 135 bpm, moderate variability, positive accelerations, no declerations Uterine activityFrequency: Every 5-7 minutes Dilation: 5 Effacement (%): 100 Station: -3 Exam by:: jolynn   Prenatal labs: ABO, Rh: B/Positive/-- (03/31 1131) Antibody: Negative (03/31 1131) Rubella: 17.80 (03/31 1131) RPR: Non Reactive (07/27 0917)  HBsAg: Negative (03/31 1131)  HIV: Non Reactive (07/27 0917)  GBS: Positive/-- (10/05 1110)  Third trimester 2 hr Glucola failed Genetic screening: NIPS Low risk, AFP neg, Horizon-4 neg Anatomy US normal  Prenatal Transfer Tool  Maternal Diabetes: Yes:  Diabetes Type:  Insulin/Medication controlled Genetic Screening: Normal Maternal Ultrasounds/Referrals: Other: LGA, AFI 95%ile Fetal Ultrasounds or other Referrals:  None Maternal Substance Abuse:  No Significant Maternal Medications:  None Significant Maternal Lab Results: Group B Strep positive  No results found for this or any previous visit (from the past 24 hour(s)).  Patient Active Problem List   Diagnosis Date Noted   Gestational diabetes mellitus (GDM) in third trimester 11/10/2020   Group B Streptococcus carrier, +RV culture, currently pregnant 11/05/2020   Excessive fetal growth affecting management of mother in third trimester, antepartum 10/11/2020   Gestational diabetes mellitus (GDM) affecting pregnancy, antepartum 08/24/2020   Supervision of other high risk pregnancy, antepartum 04/18/2020   BMI 30.0-30.9,adult 12/03/2017   Nausea and vomiting in pregnancy 12/28/2015    Assessment/Plan:  Morgan Mathews is a 30 y.o. [id number] at [redacted]w[redacted]d here for SOL  #Labor: early labor. Contractions every ~9 minutes but dilated to 5cm, fully effaced, and with bulging bag.  Admitting for expectant management. #A2GDM: q4 glucose checks #Pain: IV medication or epidural when desired #FWB: Category I #ID:  GBS pos > PCN #MOF: breast and bottle #MOC: undecided #Circ:  yes  Reuel Boom, MD  11/10/2020, 8:48 AM   Midwife attestation: I have seen and examined this patient; I agree with above documentation in the resident's note. Information/chart review by Dr Laure Kidney. Pt has a preference for female providers so I saw and assessed the patient and completed this H&P myself.  Morgan Mathews is a 30 y.o. [phone] here for active labor at term  PE: BP 113/68   Pulse 90   Temp (!) 97.5 F (36.4 C) (Oral)   Resp 18   Ht 5\' 6"  (1.676 m)   Wt 117.9 kg   LMP 02/11/2020   BMI 41.97 kg/m  Gen: calm comfortable, NAD Resp: normal effort, no distress  Abd: gravid  ROS, labs, PMH reviewed  Plan: Admit to LD Labor: expectant management Fetal monitoring: Category I ID: GBS positive, PCN ordered  Sharen Counter, CNM  11/10/2020, 11:42 AM

## 2020-11-10 NOTE — Anesthesia Preprocedure Evaluation (Signed)
Anesthesia Evaluation  Patient identified by MRN, date of birth, ID band Patient awake    Reviewed: Allergy & Precautions, Patient's Chart, lab work & pertinent test results  Airway Mallampati: II  TM Distance: >3 FB Neck ROM: Full    Dental no notable dental hx. (+) Teeth Intact, Dental Advisory Given   Pulmonary neg pulmonary ROS,    Pulmonary exam normal breath sounds clear to auscultation       Cardiovascular negative cardio ROS Normal cardiovascular exam Rhythm:Regular Rate:Normal     Neuro/Psych negative neurological ROS     GI/Hepatic   Endo/Other  diabetes, Gestational  Renal/GU      Musculoskeletal Chronic thoracic back pain   Abdominal (+) + obese,   Peds  Hematology  (+) anemia ,   Anesthesia Other Findings   Reproductive/Obstetrics (+) Pregnancy                            Lab Results  Component Value Date   WBC 5.0 11/10/2020   HGB 10.0 (L) 11/10/2020   HCT 32.0 (L) 11/10/2020   MCV 93.8 11/10/2020   PLT 186 11/10/2020    Anesthesia Physical  Anesthesia Plan  ASA: 3  Anesthesia Plan: Epidural   Post-op Pain Management:    Induction:   PONV Risk Score and Plan: 2 and Treatment may vary due to age or medical condition  Airway Management Planned: Natural Airway  Additional Equipment:   Intra-op Plan:   Post-operative Plan:   Informed Consent: I have reviewed the patients History and Physical, chart, labs and discussed the procedure including the risks, benefits and alternatives for the proposed anesthesia with the patient or authorized representative who has indicated his/her understanding and acceptance.       Plan Discussed with: Anesthesiologist  Anesthesia Plan Comments:         Anesthesia Quick Evaluation

## 2020-11-11 ENCOUNTER — Encounter (HOSPITAL_COMMUNITY): Payer: Self-pay | Admitting: Obstetrics & Gynecology

## 2020-11-11 LAB — CBC WITH DIFFERENTIAL/PLATELET
Abs Immature Granulocytes: 0.03 K/uL (ref 0.00–0.07)
Basophils Absolute: 0 K/uL (ref 0.0–0.1)
Basophils Relative: 0 %
Eosinophils Absolute: 0 K/uL (ref 0.0–0.5)
Eosinophils Relative: 0 %
HCT: 26.6 % — ABNORMAL LOW (ref 36.0–46.0)
Hemoglobin: 8.4 g/dL — ABNORMAL LOW (ref 12.0–15.0)
Immature Granulocytes: 0 %
Lymphocytes Relative: 19 %
Lymphs Abs: 1.6 K/uL (ref 0.7–4.0)
MCH: 29.3 pg (ref 26.0–34.0)
MCHC: 31.6 g/dL (ref 30.0–36.0)
MCV: 92.7 fL (ref 80.0–100.0)
Monocytes Absolute: 0.7 K/uL (ref 0.1–1.0)
Monocytes Relative: 8 %
Neutro Abs: 6.1 K/uL (ref 1.7–7.7)
Neutrophils Relative %: 73 %
Platelets: 169 K/uL (ref 150–400)
RBC: 2.87 MIL/uL — ABNORMAL LOW (ref 3.87–5.11)
RDW: 13.6 % (ref 11.5–15.5)
WBC: 8.4 K/uL (ref 4.0–10.5)
nRBC: 0 % (ref 0.0–0.2)

## 2020-11-11 LAB — DIC (DISSEMINATED INTRAVASCULAR COAGULATION)PANEL
D-Dimer, Quant: 2.33 ug/mL-FEU — ABNORMAL HIGH (ref 0.00–0.50)
Fibrinogen: 412 mg/dL (ref 210–475)
INR: 1 (ref 0.8–1.2)
Platelets: 197 10*3/uL (ref 150–400)
Prothrombin Time: 13.4 seconds (ref 11.4–15.2)
Smear Review: NONE SEEN
aPTT: 22 seconds — ABNORMAL LOW (ref 24–36)

## 2020-11-11 LAB — GLUCOSE, CAPILLARY: Glucose-Capillary: 134 mg/dL — ABNORMAL HIGH (ref 70–99)

## 2020-11-11 LAB — CBC
HCT: 29.7 % — ABNORMAL LOW (ref 36.0–46.0)
Hemoglobin: 9.5 g/dL — ABNORMAL LOW (ref 12.0–15.0)
MCH: 29.8 pg (ref 26.0–34.0)
MCHC: 32 g/dL (ref 30.0–36.0)
MCV: 93.1 fL (ref 80.0–100.0)
Platelets: 195 10*3/uL (ref 150–400)
RBC: 3.19 MIL/uL — ABNORMAL LOW (ref 3.87–5.11)
RDW: 13.7 % (ref 11.5–15.5)
WBC: 11.1 10*3/uL — ABNORMAL HIGH (ref 4.0–10.5)
nRBC: 0 % (ref 0.0–0.2)

## 2020-11-11 MED ORDER — OXYCODONE HCL 5 MG PO TABS
5.0000 mg | ORAL_TABLET | Freq: Four times a day (QID) | ORAL | Status: DC | PRN
  Administered 2020-11-11 (×2): 5 mg via ORAL
  Filled 2020-11-11 (×2): qty 1

## 2020-11-11 MED ORDER — SODIUM CHLORIDE 0.9 % IV SOLN
500.0000 mg | Freq: Once | INTRAVENOUS | Status: AC
  Administered 2020-11-11: 500 mg via INTRAVENOUS
  Filled 2020-11-11: qty 25

## 2020-11-11 MED ORDER — BENZOCAINE-MENTHOL 20-0.5 % EX AERO: 1.0000 "application " | INHALATION_SPRAY | CUTANEOUS | Status: DC | PRN

## 2020-11-11 MED ORDER — ZOLPIDEM TARTRATE 5 MG PO TABS: 5.0000 mg | ORAL_TABLET | Freq: Every evening | ORAL | Status: DC | PRN

## 2020-11-11 MED ORDER — OXYTOCIN-SODIUM CHLORIDE 30-0.9 UT/500ML-% IV SOLN
INTRAVENOUS | Status: AC
  Filled 2020-11-11: qty 500

## 2020-11-11 MED ORDER — OXYTOCIN-SODIUM CHLORIDE 30-0.9 UT/500ML-% IV SOLN: 10.0000 [IU]/h | INTRAVENOUS | Status: DC

## 2020-11-11 MED ORDER — LACTATED RINGERS IV BOLUS
1000.0000 mL | Freq: Once | INTRAVENOUS | Status: AC
  Administered 2020-11-11: 1000 mL via INTRAVENOUS

## 2020-11-11 MED ORDER — TRANEXAMIC ACID-NACL 1000-0.7 MG/100ML-% IV SOLN
INTRAVENOUS | Status: AC
  Administered 2020-11-11: 1000 mg
  Filled 2020-11-11: qty 100

## 2020-11-11 MED ORDER — COCONUT OIL OIL: 1.0000 "application " | TOPICAL_OIL | Status: DC | PRN

## 2020-11-11 MED ORDER — SIMETHICONE 80 MG PO CHEW: 80.0000 mg | CHEWABLE_TABLET | ORAL | Status: DC | PRN

## 2020-11-11 MED ORDER — METHYLERGONOVINE MALEATE 0.2 MG/ML IJ SOLN: 0.2000 mg | Freq: Once | INTRAMUSCULAR | Status: DC

## 2020-11-11 MED ORDER — ONDANSETRON HCL 4 MG PO TABS: 4.0000 mg | ORAL_TABLET | ORAL | Status: DC | PRN

## 2020-11-11 MED ORDER — DIPHENHYDRAMINE HCL 25 MG PO CAPS: 25.0000 mg | ORAL_CAPSULE | Freq: Four times a day (QID) | ORAL | Status: DC | PRN

## 2020-11-11 MED ORDER — FENTANYL CITRATE (PF) 100 MCG/2ML IJ SOLN: 25.0000 ug | INTRAMUSCULAR | Status: DC | PRN

## 2020-11-11 MED ORDER — MEASLES, MUMPS & RUBELLA VAC IJ SOLR: 0.5000 mL | Freq: Once | INTRAMUSCULAR | Status: DC

## 2020-11-11 MED ORDER — METHYLERGONOVINE MALEATE 0.2 MG PO TABS
0.2000 mg | ORAL_TABLET | ORAL | Status: AC
  Administered 2020-11-11 – 2020-11-12 (×6): 0.2 mg via ORAL
  Filled 2020-11-11 (×6): qty 1

## 2020-11-11 MED ORDER — SODIUM CHLORIDE 0.9 % IV SOLN: 250.0000 mL | INTRAVENOUS | Status: DC | PRN

## 2020-11-11 MED ORDER — FENTANYL CITRATE (PF) 100 MCG/2ML IJ SOLN
INTRAMUSCULAR | Status: AC
  Administered 2020-11-11: 25 ug via INTRAVENOUS
  Filled 2020-11-11: qty 2

## 2020-11-11 MED ORDER — IBUPROFEN 600 MG PO TABS
600.0000 mg | ORAL_TABLET | Freq: Four times a day (QID) | ORAL | Status: DC
  Administered 2020-11-11 – 2020-11-12 (×6): 600 mg via ORAL
  Filled 2020-11-11 (×6): qty 1

## 2020-11-11 MED ORDER — DIBUCAINE (PERIANAL) 1 % EX OINT: 1.0000 "application " | TOPICAL_OINTMENT | CUTANEOUS | Status: DC | PRN

## 2020-11-11 MED ORDER — PRENATAL MULTIVITAMIN CH
1.0000 | ORAL_TABLET | Freq: Every day | ORAL | Status: DC
  Administered 2020-11-11 – 2020-11-12 (×2): 1 via ORAL
  Filled 2020-11-11 (×2): qty 1

## 2020-11-11 MED ORDER — LACTATED RINGERS IV SOLN: INTRAVENOUS | Status: AC

## 2020-11-11 MED ORDER — SODIUM CHLORIDE 0.9% FLUSH: 3.0000 mL | INTRAVENOUS | Status: DC | PRN

## 2020-11-11 MED ORDER — METHYLERGONOVINE MALEATE 0.2 MG/ML IJ SOLN
0.2000 mg | Freq: Once | INTRAMUSCULAR | Status: AC
  Administered 2020-11-11: 0.2 mg via INTRAMUSCULAR

## 2020-11-11 MED ORDER — OXYTOCIN 10 UNIT/ML IJ SOLN: 10.0000 [IU] | Freq: Once | INTRAMUSCULAR | Status: DC | PRN

## 2020-11-11 MED ORDER — SENNOSIDES-DOCUSATE SODIUM 8.6-50 MG PO TABS
2.0000 | ORAL_TABLET | ORAL | Status: DC
  Administered 2020-11-11 – 2020-11-12 (×2): 2 via ORAL
  Filled 2020-11-11 (×2): qty 2

## 2020-11-11 MED ORDER — METHYLERGONOVINE MALEATE 0.2 MG/ML IJ SOLN
INTRAMUSCULAR | Status: AC
  Filled 2020-11-11: qty 1

## 2020-11-11 MED ORDER — TETANUS-DIPHTH-ACELL PERTUSSIS 5-2.5-18.5 LF-MCG/0.5 IM SUSY: 0.5000 mL | PREFILLED_SYRINGE | Freq: Once | INTRAMUSCULAR | Status: DC

## 2020-11-11 MED ORDER — SODIUM CHLORIDE 0.9% FLUSH: 3.0000 mL | Freq: Two times a day (BID) | INTRAVENOUS | Status: DC

## 2020-11-11 MED ORDER — TRANEXAMIC ACID-NACL 1000-0.7 MG/100ML-% IV SOLN: 1000.0000 mg | Freq: Once | INTRAVENOUS | Status: DC

## 2020-11-11 MED ORDER — ACETAMINOPHEN 325 MG PO TABS
650.0000 mg | ORAL_TABLET | ORAL | Status: DC | PRN
  Administered 2020-11-11 – 2020-11-12 (×3): 650 mg via ORAL
  Filled 2020-11-11 (×3): qty 2

## 2020-11-11 MED ORDER — ONDANSETRON HCL 4 MG/2ML IJ SOLN: 4.0000 mg | INTRAMUSCULAR | Status: DC | PRN

## 2020-11-11 MED ORDER — WITCH HAZEL-GLYCERIN EX PADS: 1.0000 "application " | MEDICATED_PAD | CUTANEOUS | Status: DC | PRN

## 2020-11-11 MED ORDER — METHYLERGONOVINE MALEATE 0.2 MG/ML IJ SOLN: 0.2000 mg | INTRAMUSCULAR | Status: AC

## 2020-11-11 MED ORDER — LACTATED RINGERS IV SOLN: INTRAVENOUS | Status: DC

## 2020-11-11 NOTE — Progress Notes (Signed)
   11/11/20 0023  Clinical Encounter Type  Visited With Patient not available  Visit Type Initial;Code  Referral From Nurse  Consult/Referral To Chaplain   Chaplain responded to Code Hemorrhage. No current spiritual care needs. Pt's nurse will page if needed. Chaplain remains available.  This note was prepared by Paul Half, MDiv. Chaplain remains available as needed through the on-call pager: [phone].

## 2020-11-11 NOTE — Progress Notes (Signed)
CSW met with MOB to complete consult for food insecurity. CSW observed MOB resting in bed, bonding with infant, and FOB on phone at couch. MOB gave CSW verbal consent to complete consult while FOB was present. CSW explained role, and reason for consult. MOB was pleasant, and polite during engagement with CSW. MOB denied any history of mental health, psychotropic medication, and therapy involvement. MOB reported, since delivery she feels, "fine". MOB reported, FOB, and her friends are very supportive. MOB denied SI, and HI when CSW assessed for safety.   CSW provided education regarding the baby blues period vs. perinatal mood disorders, discussed treatment and gave resources for mental health follow up if concerns arise. CSW recommends self- evaluation during the postpartum time period using the New Mom Checklist from Postpartum Progress and encouraged MOB to contact a medical professional if symptoms are noted at any time.   MOB reported, she receives [facility], and food stamps. MOB reported, there are no barriers to follow up infant's care. CSW inform MOB about medicaid transportation for additional support. MOB reported, she has all essentials needed to care for infant. MOB reported, infant has a car seat, and bassinet. MOB denied any additional barriers.     CSW provided education on sudden infant death syndrome (SIDS).  CSW provided food resources (little green & blue books).   CSW identifies no further need for intervention or barriers to discharge at this time.  Darcus Austin, MSW, LCSW-A Clinical Social Worker- Weekends [phone]

## 2020-11-11 NOTE — Lactation Note (Signed)
This note was copied from a baby's chart. Lactation Consultation Note  Patient Name: Morgan Mathews Today's Date: 11/11/2020 Reason for consult: Initial assessment Age:29 hours   LC Note:  Lactation order entered at 24. Attempted to visit with mother, however, family members were asleep.  Will return later today for initial visit.   Maternal Data    Feeding Nipple Type: Slow - flow  LATCH Score                    Lactation Tools Discussed/Used    Interventions    Discharge    Consult Status Consult Status: Follow-up Date: 11/11/20 Follow-up type: In-patient    [name] 11/11/2020, 11:23 AM

## 2020-11-11 NOTE — Progress Notes (Signed)
Post Partum Day 1 Subjective: no complaints, tolerating PO, + flatus, and foley catheter in place, has not been up out of bed since hemorrhage on 10/14. Denies dizziness or h/a when sitting up in bed.  Objective: Blood pressure 113/69, pulse 80, temperature 98.5 F (36.9 C), temperature source Oral, resp. rate 16, height 5\' 6"  (1.676 m), weight 117.9 kg, last menstrual period 02/11/2020, SpO2 98 %, unknown if currently breastfeeding.  Physical Exam:  General: alert, cooperative, and no distress Lochia: appropriate Uterine Fundus: firm Incision: n/a DVT Evaluation: No evidence of DVT seen on physical exam.  Recent Labs    11/11/20 0029 11/11/20 0640  HGB 9.5* 8.4*  HCT 29.7* 26.6*    Assessment/Plan: Plan for discharge tomorrow, Breastfeeding, Circumcision prior to discharge, and Contraception undecided Pt with delayed PPH on 11/10/20, Hgb down from 10.0 to 8.4 Discussed options, including IV iron. Pt elects to receive IV iron.  Foley to come out, pt to ambulate today    LOS: 1 day   11/12/20 11/11/2020, 1:20 PM

## 2020-11-11 NOTE — Progress Notes (Signed)
Called by RN due to dark urine color and abdominal cramping.   Evaluated patient at bedside. Dark orange-red urine noted in foley bag.   Placed orders for oxycodone PRN in addition to tylenol/ibuprofen already ordered. Suspect hematuria in the setting of foley trauma with recent delivery. Emptied bag and will monitor urine output/color with IV fluids continuing over the next several hours.   [name], DO

## 2020-11-11 NOTE — Anesthesia Postprocedure Evaluation (Signed)
Anesthesia Post Note  Patient: Morgan Mathews  Procedure(s) Performed: AN AD HOC LABOR EPIDURAL     Patient location during evaluation: Mother Baby Anesthesia Type: Epidural Level of consciousness: awake and alert Pain management: pain level controlled Vital Signs Assessment: post-procedure vital signs reviewed and stable Respiratory status: spontaneous breathing Cardiovascular status: stable Postop Assessment: no headache, adequate PO intake, no backache, patient able to bend at knees, able to ambulate, epidural receding and no apparent nausea or vomiting Anesthetic complications: no   No notable events documented.  Last Vitals:  Vitals:   11/11/20 0500 11/11/20 0738  BP: 116/69 113/69  Pulse: 88 80  Resp: 18 16  Temp: 36.6 C 36.9 C  SpO2:  98%    Last Pain:  Vitals:   11/11/20 0738  TempSrc: Oral  PainSc: 0-No pain   Pain Goal:                   [name]

## 2020-11-11 NOTE — Progress Notes (Signed)
Interim progress note  Significant Event:   Called by postpartum RN due to passing a few clots to assess bleeding. EBL immediately following delivery with firm fundus at that time, infant LGA.   Evaluated patient at bedside. Difficult to feel uterine fundus, boggy, however felt about 1-2 cm above umbilicus. After verbal consent, performed manual lower uterine sweep with retrieval of a large amount of clots, approximately . Ordered 2nd bag of pit and given IM methergine. Code hemorrhage called. IV fluid 1L bolus started and 2nd IV placed. Given IV Fent and performed second uterine sweep with an additional of clots shortly afterwards. Attempted to place JADA, however fortunately uterine tone significantly improved and clamped down, did not end up placing this.  TXA ordered and given.   Placed a urine foley, dark yellow urine retrieved. Fundus firm and at umbilicus. After approximately 20 minutes, performed one last sweep with no clots. EBL on postpartum . CBC and DIC Labs ordered. Will continue fluids x12 hours after bolus complete and leave urinary catheter in until at least later this morning. Patient hemodynamically stable, VSS. Offered 1U pRBCs due to blood loss with hemoglobin 10 prior to delivery, however patient wanted to wait for lab results. Pt currently feeling tired and a little lightheaded, but otherwise denied any dizziness, SOB, or CP. Dr. Despina Hidden was present shortly after JADA attempt and aware of management as above.   Meds given: TXA, 2nd bag of pit, meth IM x1 and will cont series x6 doses q4.  Total EBL: 1,570ml   [name], DO

## 2020-11-11 NOTE — Lactation Note (Addendum)
This note was copied from a baby's chart. Lactation Consultation Note  Patient Name: Morgan Mathews Today's Date: 11/11/2020 Reason for consult: Initial assessment;Term Age:29 hours   P4 mother whose infant is now 54 hours old.  This is a term baby at 39+0 weeks.  Mother had a PPH.  Her current feeding preference is breast/formula. Mother breast fed her other children; the last child for 2 years.  Mother had no questions/concerns related to breast feeding.  She reported that her son is able to latch well.  She has been primarily formula feeding; large amounts given. (15-20 mls)  Provided supplementation guideline handout.  Mother is familiar with hand expression.  Encouraged to call her RN/LC for latch assistance as needed.  Mother does not feel like she needs assistance. Mother's consult status will be prn at this time.  She is aware that she may call at any time if desired.  Father present.  RN in room and updated.   Maternal Data Has patient been taught Hand Expression?: Yes Does the patient have breastfeeding experience prior to this delivery?: Yes How long did the patient breastfeed?: Breast fed all her other children; the last one for 2 years  Feeding Mother's Current Feeding Choice: Breast Milk and Formula Nipple Type: Slow - flow  LATCH Score                    Lactation Tools Discussed/Used    Interventions Interventions: Education;Breast feeding basics reviewed  Discharge    Consult Status Consult Status: PRN Date: 11/11/20 Follow-up type: In-patient    Tymeir Weathington R Elza Sortor 11/11/2020, 1:37 PM

## 2020-11-12 MED ORDER — IBUPROFEN 600 MG PO TABS: 600.0000 mg | ORAL_TABLET | Freq: Four times a day (QID) | ORAL | 0 refills | Status: DC

## 2020-11-12 MED ORDER — ACETAMINOPHEN 325 MG PO TABS: 650.0000 mg | ORAL_TABLET | ORAL | Status: DC | PRN

## 2020-11-12 NOTE — Lactation Note (Signed)
This note was copied from a baby's chart. Lactation Consultation Note  Patient Name: Morgan Mathews Today's Date: 11/12/2020 Reason for consult: Follow-up assessment;Infant weight loss Age:29 hours Baby has been receiving mostly bottles.  Per mom requesting a hand pump.  LC provided the hand pump with instructions / #24 F a good fit and #27 F provided for when the milk comes in. [facility] discussed how to transition the baby back to the breast by providing an appetizer of EBM or formula with a bottle so the baby is calm prior to latching if needed.  LC reviewed BF D/C teaching.  Mom has the [facility] brochure with resources.   Maternal Data    Feeding Mother's Current Feeding Choice: Breast Milk and Formula Nipple Type: Slow - flow  LATCH Score                    Lactation Tools Discussed/Used Tools: Pump Breast pump type: Manual Pump Education: Milk Storage;Setup, frequency, and cleaning  Interventions Interventions: Breast feeding basics reviewed;Education;Hand pump  Discharge Discharge Education: Engorgement and breast care Pump: Manual WIC Program: Yes  Consult Status Consult Status: Complete Date: 11/12/20    Morgan Mathews 11/12/2020, 9:12 AM

## 2020-11-12 NOTE — Progress Notes (Signed)
The patient started having increase bleeding with clots within thirty minutes after arrival. The Total blood loss was 425 ml. The Rn called Dr. Annia Friendly due to a concern of remaining clots  vaginally which needed to be extracted vaginally. In addition, the patient 's abdomen was tight and distended. Per the labor and delivery Rn the patient voided a lot prior to arrival on mother baby. With Dr Annia Friendly present an attempt to take patient to restroom failed due to dizziness  and decreased blood pressure. After manually  expressing all clots by Dr. Annia Friendly  the patient's total blood loss was 1554 mls including 125 mls from labor and delivery.    A CODE Hemorrhage was called during the process and protocols for meds, labs and fluids was initiated.   Post 15 minutes checks and vital signs were done for 1 hour.

## 2020-11-13 ENCOUNTER — Telehealth: Payer: Self-pay

## 2020-11-13 NOTE — Telephone Encounter (Signed)
Transition Care Management Follow-up Telephone Call Date of discharge and from where: 11/12/2020 [facility] How have you been since you were released from the hospital? Pt stated that she is feeling very well and did not have any questions or concerns at this time.  Any questions or concerns? No  Items Reviewed: Did the pt receive and understand the discharge instructions provided? Yes  Medications obtained and verified? Yes  Other? No  Any new allergies since your discharge? No  Dietary orders reviewed? No Do you have support at home? Yes   Functional Questionnaire: (I = Independent and D = Dependent) ADLs: I  Bathing/Dressing- I  Meal Prep- I  Eating- I  Maintaining continence- I  Transferring/Ambulation- I  Managing Meds- I   Follow up appointments reviewed:  PCP Hospital f/u appt confirmed? No   Specialist Hospital f/u appt confirmed? Yes  Scheduled to see Edd Arbour, CNM on 12/13/2020 @ 8:15am. Are transportation arrangements needed? No  If their condition worsens, is the pt aware to call PCP or go to the Emergency Dept.? Yes Was the patient provided with contact information for the PCP's office or ED? Yes Was to pt encouraged to call back with questions or concerns? Yes

## 2020-11-14 LAB — BPAM RBC
Blood Product Expiration Date: 202210262359
Blood Product Expiration Date: 202210272359
ISSUE DATE / TIME: 202210141650
ISSUE DATE / TIME: 202210141650
Unit Type and Rh: 7300
Unit Type and Rh: 7300

## 2020-11-14 LAB — TYPE AND SCREEN
ABO/RH(D): B POS
Antibody Screen: NEGATIVE
Unit division: 0
Unit division: 0

## 2020-11-16 ENCOUNTER — Telehealth: Payer: Self-pay | Admitting: Family Medicine

## 2020-11-16 NOTE — Telephone Encounter (Signed)
Called patient and let her know that breast pump order was faxed out today. Patient voiced understanding.

## 2020-11-16 NOTE — Telephone Encounter (Signed)
Requesting a RX for a breast pump

## 2020-11-23 ENCOUNTER — Telehealth (HOSPITAL_COMMUNITY): Payer: Self-pay

## 2020-11-23 NOTE — Telephone Encounter (Signed)
"  I'm fine, feeling good." Patient declines any questions or concerns about her healing.  "He's doing good. Eating well. We went to the doctor and everything was fine. He sleeps in a bassinet." RN reviewed ABC's of safe sleep with patient. Patient declines any questions or concerns about baby.  EPDS score is 0.  Morgan Mathews [facility] 11/23/2020,1338

## 2020-11-24 ENCOUNTER — Encounter: Payer: Self-pay | Admitting: Radiology

## 2020-12-13 ENCOUNTER — Ambulatory Visit (INDEPENDENT_AMBULATORY_CARE_PROVIDER_SITE_OTHER): Payer: Medicaid Other | Admitting: Certified Nurse Midwife

## 2020-12-13 ENCOUNTER — Other Ambulatory Visit: Payer: Self-pay

## 2020-12-13 DIAGNOSIS — D508 Other iron deficiency anemias: Secondary | ICD-10-CM | POA: Diagnosis not present

## 2020-12-13 DIAGNOSIS — Z23 Encounter for immunization: Secondary | ICD-10-CM | POA: Diagnosis not present

## 2020-12-13 DIAGNOSIS — Z3009 Encounter for other general counseling and advice on contraception: Secondary | ICD-10-CM

## 2020-12-13 MED ORDER — NORETHINDRONE 0.35 MG PO TABS: 1.0000 | ORAL_TABLET | Freq: Every day | ORAL | 11 refills | Status: DC

## 2020-12-13 MED ORDER — IRON POLYSACCH CMPLX-B12-FA 150-0.025-1 MG PO CAPS: 1.0000 | ORAL_CAPSULE | Freq: Every day | ORAL | 5 refills | Status: DC

## 2020-12-13 NOTE — Progress Notes (Signed)
Post Partum Visit Note  Morgan Mathews is a 30 y.o. G45P4004 female who presents for a postpartum visit. She is 4 weeks and 5 days postpartum following a normal spontaneous vaginal delivery.  I have fully reviewed the prenatal and intrapartum course. The delivery was at 39 gestational weeks.  Anesthesia: epidural. Postpartum course has been uncomplicated. Baby is doing well. Baby is feeding by both breast and bottle - Similac Advance. Bleeding pinked stained. Bowel function is normal. Bladder function is normal. Patient is not sexually active. Contraception method is none. Postpartum depression screening: negative.   Upstream - 12/14/20 1127       Pregnancy Intention Screening   Does the patient want to become pregnant in the next year? No    Does the patient's partner want to become pregnant in the next year? No    Would the patient like to discuss contraceptive options today? Yes      Contraception Wrap Up   Current Method No Contraceptive Precautions    End Method Oral Contraceptive    Contraception Counseling Provided Yes            The pregnancy intention screening data noted above was reviewed. Potential methods of contraception were discussed. The patient elected to proceed with Oral Contraceptive.   Edinburgh Postnatal Depression Scale - 12/13/20 0844       Edinburgh Postnatal Depression Scale:  In the Past 7 Days   I have been able to laugh and see the funny side of things. 0    I have looked forward with enjoyment to things. 0    I have blamed myself unnecessarily when things went wrong. 0    I have been anxious or worried for no good reason. 0    I have felt scared or panicky for no good reason. 0    Things have been getting on top of me. 0    I have been so unhappy that I have had difficulty sleeping. 0    I have felt sad or miserable. 0    I have been so unhappy that I have been crying. 0    The thought of harming myself has occurred to me. 0    Edinburgh  Postnatal Depression Scale Total 0            Health Maintenance Due  Topic Date Due   Pneumococcal Vaccine 57-39 Years old (1 - PCV) Never done   FOOT EXAM  Never done   OPHTHALMOLOGY EXAM  Never done   URINE MICROALBUMIN  Never done   COVID-19 Vaccine (3 - Booster for Pfizer series) 07/06/2019   HEMOGLOBIN A1C  10/27/2020    The following portions of the patient's history were reviewed and updated as appropriate: allergies, current medications, past family history, past medical history, past social history, past surgical history, and problem list.  Review of Systems Pertinent items noted in HPI and remainder of comprehensive ROS otherwise negative.  Objective:  BP 109/60   Pulse 60   Wt 238 lb 11.2 oz (108.3 kg)   LMP 02/11/2020   Breastfeeding Yes   BMI 38.53 kg/m    General:  alert, cooperative, and appears stated age   Breasts:  normal  Lungs: Normal effort  Heart:  regular rate and rhythm  Abdomen: Soft, non-tender    Wound N/A  GU exam:  not indicated       Assessment:  Postpartum care and examination of lactating mother Iron deficiency anemia due to inadequate iron  intake - Iron/B12 blend sent to pharmacy 3. Counseling for birth control, oral contraceptives - POPs sent to pharmacy  Plan:  Essential components of care per ACOG recommendations:  1.  Mood and well being: Patient with negative depression screening today. Reviewed local resources for support.  - Patient tobacco use? No.   - hx of drug use? No.    2. Infant care and feeding:  -Patient currently breastmilk feeding? Yes. Reviewed importance of draining breast regularly to support lactation.  -Social determinants of health (SDOH) reviewed in EPIC. No concerns  3. Sexuality, contraception and birth spacing - Patient does not want a pregnancy in the next year.  Desired family size is 4 children.  - Reviewed forms of contraception in tiered fashion. Patient desired oral progesterone-only  contraceptive today.   - Discussed birth spacing of 18 months  4. Sleep and fatigue -Encouraged family/partner/community support of 4 hrs of uninterrupted sleep to help with mood and fatigue  5. Physical Recovery  - Discussed patients delivery and complications. She describes her labor as good. - Patient had a Vaginal, no problems at delivery. Patient had  no  laceration. Perineal healing reviewed. Patient expressed understanding - Patient has urinary incontinence? No. - Patient is safe to resume physical and sexual activity when ready  6.  Health Maintenance - HM due items addressed Yes - Last pap smear 07/29/19, normal. Pap smear not done at today's visit.  -Breast Cancer screening indicated? No.   7. Chronic Disease/Pregnancy Condition follow up: None - PCP follow up - Needs to schedule PP GTT, message sent to front staff.  Bernerd Limbo, CNM [facility] [employer], [facility]

## 2020-12-14 ENCOUNTER — Telehealth: Payer: Self-pay | Admitting: General Practice

## 2020-12-14 NOTE — Telephone Encounter (Signed)
Patient called into front office reporting an issue with her prescription. Patient stated the medication isn't made anymore and needs something else. Called patient's pharmacy and adjusted brand of iron supplement so Rx would be covered. Called & informed patient her pharmacy is getting the Rx ready for pickup. Patient verbalized understanding.

## 2020-12-18 ENCOUNTER — Other Ambulatory Visit: Payer: Self-pay | Admitting: *Deleted

## 2020-12-18 DIAGNOSIS — O24429 Gestational diabetes mellitus in childbirth, unspecified control: Secondary | ICD-10-CM

## 2020-12-19 ENCOUNTER — Other Ambulatory Visit: Payer: Self-pay | Admitting: Certified Nurse Midwife

## 2020-12-19 ENCOUNTER — Telehealth: Payer: Self-pay | Admitting: *Deleted

## 2020-12-19 DIAGNOSIS — D508 Other iron deficiency anemias: Secondary | ICD-10-CM

## 2020-12-19 MED ORDER — POLYSACCHARIDE IRON COMPLEX 150 MG PO CAPS: 150.0000 mg | ORAL_CAPSULE | ORAL | 2 refills | Status: DC

## 2020-12-19 NOTE — Telephone Encounter (Signed)
Received fax from [facility] pharmacy they do not carry Iron complex 150mg  capsule, asking for alternative to be prescribed. Will forward to provider. Shayden Bobier,RN

## 2020-12-20 ENCOUNTER — Other Ambulatory Visit: Payer: Self-pay

## 2020-12-20 ENCOUNTER — Other Ambulatory Visit: Payer: Medicaid Other

## 2020-12-20 DIAGNOSIS — O24429 Gestational diabetes mellitus in childbirth, unspecified control: Secondary | ICD-10-CM

## 2020-12-21 LAB — GLUCOSE TOLERANCE, 2 HOURS
Glucose, 2 hour: 103 mg/dL (ref 70–139)
Glucose, GTT - Fasting: 83 mg/dL (ref 70–99)

## 2021-04-03 ENCOUNTER — Telehealth: Payer: Self-pay | Admitting: Family Medicine

## 2021-04-03 NOTE — Telephone Encounter (Signed)
Patient stated that she received a Bill from [place] from when she was pregnant, patient said she didn't get the test and want to know why she was changed over 1,000.  ?

## 2021-04-11 NOTE — Telephone Encounter (Signed)
[employer] representative contacted. Will reach out to patient.  ?

## 2021-04-17 NOTE — Telephone Encounter (Signed)
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 Patient should not owe any copay for genetic screening. Called patient with update and instructions to void bill that was already received.  ?

## 2021-06-20 DIAGNOSIS — H5213 Myopia, bilateral: Secondary | ICD-10-CM | POA: Diagnosis not present

## 2021-07-11 DIAGNOSIS — H52222 Regular astigmatism, left eye: Secondary | ICD-10-CM | POA: Diagnosis not present

## 2021-07-11 DIAGNOSIS — H5203 Hypermetropia, bilateral: Secondary | ICD-10-CM | POA: Diagnosis not present

## 2021-08-08 DIAGNOSIS — Z23 Encounter for immunization: Secondary | ICD-10-CM | POA: Diagnosis not present

## 2021-09-10 ENCOUNTER — Encounter (HOSPITAL_COMMUNITY): Payer: Self-pay | Admitting: Emergency Medicine

## 2021-09-10 ENCOUNTER — Ambulatory Visit (INDEPENDENT_AMBULATORY_CARE_PROVIDER_SITE_OTHER): Payer: Medicaid Other

## 2021-09-10 ENCOUNTER — Ambulatory Visit (HOSPITAL_COMMUNITY)
Admission: EM | Admit: 2021-09-10 | Discharge: 2021-09-10 | Disposition: A | Discharge status: Home / Self Care | Payer: Medicaid Other | Attending: Internal Medicine | Admitting: Internal Medicine

## 2021-09-10 DIAGNOSIS — M79671 Pain in right foot: Secondary | ICD-10-CM

## 2021-09-10 DIAGNOSIS — S9031XA Contusion of right foot, initial encounter: Secondary | ICD-10-CM | POA: Diagnosis not present

## 2021-09-10 DIAGNOSIS — M7989 Other specified soft tissue disorders: Secondary | ICD-10-CM | POA: Diagnosis not present

## 2021-09-10 MED ORDER — IBUPROFEN 600 MG PO TABS: 600.0000 mg | ORAL_TABLET | Freq: Four times a day (QID) | ORAL | 0 refills | Status: DC | PRN

## 2021-09-10 MED ORDER — IBUPROFEN 600 MG PO TABS: 600.0000 mg | ORAL_TABLET | Freq: Four times a day (QID) | ORAL | 0 refills | Status: DC

## 2021-09-10 NOTE — ED Provider Notes (Signed)
[facility]    CSN: [id number] Arrival date & time: 09/10/21  1553      History   Chief Complaint Chief Complaint  Patient presents with   Foot Injury    HPI Morgan Mathews is a 31 y.o. female.   31 year old female presents with right foot pain.  Patient indicates yesterday she was at work when she dropped one of the heavy pieces of glass and it fell onto her right foot.  Patient indicates that she did not initially have pain however late Sunday evening and today she started having right foot pain and soreness.  Patient indicates that it hurts her to walk and put weight on the right foot.  There is minimal swelling to the right foot at the injury site.  The patient indicates she has not taken any medicine for pain relief.  Patient denies weakness, numbness or tingling.   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 Authorizing Provider  ibuprofen (ADVIL) 600 MG tablet Take 1 tablet (600 mg total) by mouth every 6 (six) hours as needed. 09/10/21  Yes Ellsworth Lennox, PA-C  acetaminophen (TYLENOL) 325 MG tablet Take 2 tablets (650 mg total) by mouth every 4 (four) hours as needed (for pain scale < 4). 11/12/20   Autry-Lott, Randa Evens, DO  iron polysaccharides (NIFEREX) 150 MG capsule Take 1 capsule (150  mg total) by mouth every other day. 12/19/20   Bernerd Limbo, CNM  norethindrone (MICRONOR) 0.35 MG tablet Take 1 tablet (0.35 mg total) by mouth daily. 12/13/20   Bernerd Limbo, CNM    Family History Family History  Problem Relation Age of Onset   Diabetes Mother    Healthy Father     Social History Social History   Tobacco Use   Smoking status: Never   Smokeless tobacco: Never  Vaping Use   Vaping Use: Never used  Substance Use Topics   Alcohol use: No   Drug use: No     Allergies   Patient has no known allergies.   Review of Systems Review of Systems  Musculoskeletal:  Positive for gait problem (right foot pain).     Physical Exam Triage Vital Signs ED Triage Vitals  Enc Vitals Group     BP 09/10/21 1629 132/85     Pulse Rate 09/10/21 1629 77     Resp 09/10/21 1629 16     Temp 09/10/21 1629 97.9 F (36.6 C)     Temp Source 09/10/21 1629 Oral     SpO2  09/10/21 1629 100 %     Weight --      Height --      Head Circumference --      Peak Flow --      Pain Score 09/10/21 1630 10     Pain Loc --      Pain Edu? --      Excl. in GC? --    No data found.  Updated Vital Signs BP 132/85 (BP Location: Right Arm)   Pulse 77   Temp 97.9 F (36.6 C) (Oral)   Resp 16   SpO2 100%   Visual Acuity Right Eye Distance:   Left Eye Distance:   Bilateral Distance:    Right Eye Near:   Left Eye Near:    Bilateral Near:     Physical Exam Constitutional:      Appearance: Normal appearance.  Musculoskeletal:       Feet:  Feet:     Comments: Right foot: Pain on palpation of the mid proximal dorsum of the foot.  Range of motion is present with pain on flexion and rotation.  Minimal swelling present at injury site.  There is no crepitus noted with range of motion of the foot. Neurological:     Mental Status: She is alert.      UC Treatments / Results  Labs (all labs ordered are listed, but only abnormal results are displayed) Labs Reviewed - No  data to display  EKG   Radiology DG Foot Complete Right  Result Date: 09/10/2021 CLINICAL DATA:  Bottle fell on top of right foot. Pain and swelling. EXAM: RIGHT FOOT COMPLETE - 3+ VIEW COMPARISON:  None Available. FINDINGS: There is no evidence of fracture or dislocation. There is no evidence of arthropathy or other focal bone abnormality. Mild dorsal soft tissue swelling noted overlying the metatarsal bones. IMPRESSION: 1. No acute bone abnormality. 2. Mild dorsal soft tissue swelling. Electronically Signed   By: Signa Kell M.D.   On: 09/10/2021 16:59    Procedures Procedures (including critical care time)  Medications Ordered in UC Medications - No data to display  Initial Impression / Assessment and Plan / UC Course  I have reviewed the triage vital signs and the nursing notes.  Pertinent labs & imaging results that were available during my care of the patient were reviewed by me and considered in my medical decision making (see chart for details).    Plan: 1.  Advised take ibuprofen 600 mg 1 every 6-8 hours with food to help reduce the pain and swelling of the foot. 2.  Advised to wear a good cushioned supportive shoe in order to reduce the pain on weightbearing. 3.  Advised follow-up with PCP or return to urgent care if symptoms fail to improve. 4.  Ace wrap applied for added support. Final Clinical Impressions(s) / UC Diagnoses   Final diagnoses:  Contusion of right foot, initial encounter  Right foot pain     Discharge Instructions      Advised to take the ibuprofen 600 mg 1 every 6-8 hours with food to help reduce the pain and swelling of the right foot. Advised to ice the area frequently, 10 minutes on 20 minutes off, 3-4 times throughout the day to help reduce the pain and swelling. Advised to wear a good cushioned shoe to help take the pressure off the foot. Advised to follow-up PCP or return to urgent care if symptoms fail to improve.    ED Prescriptions  Medication Sig Dispense Auth. Provider   ibuprofen (ADVIL) 600 MG tablet  (Status: Discontinued) Take 1 tablet (600 mg total) by mouth every 6 (six) hours. For foot pain. 30 tablet Ellsworth Lennox, PA-C   ibuprofen (ADVIL) 600 MG tablet Take 1 tablet (600 mg total) by mouth every 6 (six) hours as needed. 30 tablet Ellsworth Lennox, PA-C      PDMP not reviewed this encounter.   Ellsworth Lennox, PA-C 09/10/21 1712

## 2021-09-10 NOTE — Discharge Instructions (Signed)
Advised to take the ibuprofen 600 mg 1 every 6-8 hours with food to help reduce the pain and swelling of the right foot. Advised to ice the area frequently, 10 minutes on 20 minutes off, 3-4 times throughout the day to help reduce the pain and swelling. Advised to wear a good cushioned shoe to help take the pressure off the foot. Advised to follow-up PCP or return to urgent care if symptoms fail to improve.

## 2021-09-10 NOTE — ED Triage Notes (Signed)
Bottle fell on her right foot yesterday, hurts to walk.

## 2021-11-03 ENCOUNTER — Encounter: Payer: Self-pay | Admitting: Emergency Medicine

## 2021-11-03 ENCOUNTER — Ambulatory Visit (INDEPENDENT_AMBULATORY_CARE_PROVIDER_SITE_OTHER): Payer: Medicaid Other

## 2021-11-03 ENCOUNTER — Ambulatory Visit
Admission: EM | Admit: 2021-11-03 | Discharge: 2021-11-03 | Disposition: A | Discharge status: Home / Self Care | Payer: Medicaid Other | Attending: Internal Medicine | Admitting: Internal Medicine

## 2021-11-03 DIAGNOSIS — M546 Pain in thoracic spine: Secondary | ICD-10-CM

## 2021-11-03 DIAGNOSIS — M542 Cervicalgia: Secondary | ICD-10-CM

## 2021-11-03 DIAGNOSIS — Z041 Encounter for examination and observation following transport accident: Secondary | ICD-10-CM | POA: Diagnosis not present

## 2021-11-03 MED ORDER — CYCLOBENZAPRINE HCL 5 MG PO TABS: 5.0000 mg | ORAL_TABLET | Freq: Two times a day (BID) | ORAL | 0 refills | Status: DC | PRN
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
## 2021-11-03 NOTE — Discharge Instructions (Signed)
Your x-rays were normal.  Suspect that you have muscle strain/injury.  I have prescribed a muscle relaxer as we discussed which can cause drowsiness so do not drive or drink alcohol with taking this.  Follow-up with orthopedist if pain persist or worsens.
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
## 2021-12-05 ENCOUNTER — Encounter: Payer: Self-pay | Admitting: Emergency Medicine

## 2021-12-05 ENCOUNTER — Ambulatory Visit
Admission: EM | Admit: 2021-12-05 | Discharge: 2021-12-05 | Disposition: A | Discharge status: Home / Self Care | Payer: Medicaid Other | Attending: Physician Assistant | Admitting: Physician Assistant

## 2021-12-05 DIAGNOSIS — R1030 Lower abdominal pain, unspecified: Secondary | ICD-10-CM

## 2021-12-05 DIAGNOSIS — R0789 Other chest pain: Secondary | ICD-10-CM

## 2021-12-05 MED ORDER — FAMOTIDINE 20 MG PO TABS: 20.0000 mg | ORAL_TABLET | Freq: Two times a day (BID) | ORAL | 0 refills | Status: DC

## 2021-12-05 MED ORDER — NAPROXEN 375 MG PO TABS: 375.0000 mg | ORAL_TABLET | Freq: Two times a day (BID) | ORAL | 0 refills | Status: DC

## 2021-12-05 NOTE — ED Provider Notes (Signed)
[name]    CSN: [id number] Arrival date & time: 12/05/21  0943      History   Chief Complaint Chief Complaint  Patient presents with   Abdominal Pain   Chest Pain    HPI Morgan Mathews is a 31 y.o. female.   31 y/o female presents with abdominal pain and chest wall pain. Patient relates for the past 3 weeks she has been having right anterior upper chest wall pain, sharp, intermittent, worse with activity such as raising and using right arm. She denies any injury to shoulder or chest wall, no sob, or nausea.  Patient relates not taking any medication for the discomfort. Patient reports a normal chest x-ray 2 months ago.  Patient also with abdominal pain present for the past 3 weeks, intermittent, sharp, and with cramping lasting @ 3 minutes and resolving.  Patient denies nausea, frequency, urgency or dysuria.  Patient relates no relation to food and is able to eat and drink fluids well.  Patient without fever or chills.  Patient indicates she does not have a PCP yet but does have information to become established with PCP.   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 Authorizing Provider  famotidine (PEPCID) 20 MG tablet Take 1 tablet (20 mg total) by mouth 2 (two) times daily. For stomach pain. 12/05/21  Yes Ellsworth Lennox, PA-C  naproxen (NAPROSYN) 375 MG tablet Take 1 tablet (375 mg total) by mouth 2 (two) times daily. For chest pain. 12/05/21  Yes Ellsworth Lennox, PA-C  acetaminophen (TYLENOL) 325 MG tablet Take 2 tablets (650 mg total) by mouth every 4 (four) hours as needed (for pain scale < 4). 11/12/20   Autry-Lott, Randa Evens, DO  cyclobenzaprine (FLEXERIL) 5 MG tablet Take 1 tablet (5 mg total) by mouth 2 (two) times daily as needed for muscle spasms. 11/03/21   Gustavus Bryant, FNP  iron polysaccharides (NIFEREX) 150 MG capsule Take 1 capsule (150 mg total) by mouth every other day. 12/19/20   Bernerd Limbo, CNM  norethindrone (MICRONOR) 0.35 MG tablet Take 1 tablet (0.35 mg total) by mouth daily. 12/13/20   Bernerd Limbo, CNM    Family History Family History  Problem Relation Age of Onset   Diabetes Mother    Healthy Father     Social History Social History   Tobacco Use   Smoking status: Never   Smokeless  tobacco: Never  Vaping Use   Vaping Use: Never used  Substance Use Topics   Alcohol use: No   Drug use: No     Allergies   Patient has no known allergies.   Review of Systems Review of Systems  Cardiovascular:  Positive for chest pain (right upper chest wall).  Gastrointestinal:  Positive for abdominal pain (lower abdomen).     Physical Exam Triage Vital Signs ED Triage Vitals  Enc Vitals Group     BP 12/05/21 1153 109/65     Pulse Rate 12/05/21 1153 64     Resp 12/05/21 1153 18     Temp 12/05/21 1153 97.8 F (36.6 C)     Temp src --      SpO2 12/05/21 1153 97 %     Weight --      Height --      Head Circumference --      Peak Flow --      Pain Score 12/05/21 1151 6     Pain Loc --      Pain Edu? --      Excl. in [place]? --    No data found.  Updated Vital Signs BP 109/65   Pulse 64   Temp 97.8 F  (36.6 C)   Resp 18   SpO2 97%   Breastfeeding No   Visual Acuity Right Eye Distance:   Left Eye Distance:   Bilateral Distance:    Right Eye Near:   Left Eye Near:    Bilateral Near:     Physical Exam Constitutional:      Appearance: She is well-developed.  HENT:     Mouth/Throat:     Mouth: Mucous membranes are moist.     Pharynx: Oropharynx is clear. Uvula midline.  Cardiovascular:     Rate and Rhythm: Normal rate and regular rhythm.     Heart sounds: Normal heart sounds.  Pulmonary:     Effort: Pulmonary effort is normal.     Breath sounds: Normal breath sounds and air entry. No wheezing, rhonchi or rales.  Chest:       Comments: Chest Wall: pain reproduced on compression of chest wall. Abdominal:     General: Abdomen is flat. Bowel sounds are normal.     Palpations: Abdomen is soft.     Tenderness: There is no abdominal tenderness. There is no guarding or rebound.  Lymphadenopathy:     Cervical: No cervical adenopathy.  Neurological:     Mental Status: She is alert.      UC Treatments / Results  Labs (all labs ordered are listed, but only abnormal results are displayed) Labs Reviewed - No data to display  EKG   Radiology No results found.  Procedures Procedures (including critical care time)  Medications Ordered in UC Medications - No data to display  Initial Impression / Assessment and Plan / UC Course  I have reviewed the triage vital signs and the nursing notes.  Pertinent labs & imaging results that were available during my care of the patient were reviewed by me and considered in my medical decision making (see chart for details).    Plan: 1. The chest wall pain will be treated with the following: A. Naprosyn 375mg  every 12 hours to relieve the discomfort. 2. The lower abdominal pain will be treated with the following: A. Pepcid 20mg  twice daily to relieve the discomfort. 3. Become established with PCP for further evaluation of medical  issues. Final Clinical  Impressions(s) / UC Diagnoses   Final diagnoses:  Chest wall pain  Lower abdominal pain     Discharge Instructions      Advise to take the Naprosyn 375 mg, 1 tablet twice daily to help with chest pain. Advised to take Pepcid 20 mg twice daily to see if this will help relieve any stomach pain. Advise to become established with PCP to have problems evaluated by persistent.    ED Prescriptions     Medication Sig Dispense Auth. Provider   naproxen (NAPROSYN) 375 MG tablet Take 1 tablet (375 mg total) by mouth 2 (two) times daily. For chest pain. 20 tablet Ellsworth Lennox, PA-C   famotidine (PEPCID) 20 MG tablet Take 1 tablet (20 mg total) by mouth 2 (two) times daily. For stomach pain. 30 tablet Ellsworth Lennox, PA-C      PDMP not reviewed this encounter.   Ellsworth Lennox, PA-C 12/05/21 1215

## 2021-12-05 NOTE — ED Triage Notes (Signed)
Pt is present today with chest pain and abdominal pain. Pt states her chest pain started in the middle and radiates to the right side. Pt states that she feels a sharp pain in her chest and her abdominal pain cramps. Pt states her sx started x3 weeks ago

## 2021-12-05 NOTE — Discharge Instructions (Signed)
Advise to take the Naprosyn 375 mg, 1 tablet twice daily to help with chest pain. Advised to take Pepcid 20 mg twice daily to see if this will help relieve any stomach pain. Advise to become established with PCP to have problems evaluated by persistent.

## 2022-02-12 ENCOUNTER — Emergency Department (HOSPITAL_COMMUNITY): Payer: Medicaid Other

## 2022-02-12 ENCOUNTER — Ambulatory Visit (HOSPITAL_COMMUNITY)
Admission: EM | Admit: 2022-02-12 | Discharge: 2022-02-12 | Disposition: A | Discharge status: Home / Self Care | Payer: Medicaid Other | Attending: Nurse Practitioner | Admitting: Nurse Practitioner

## 2022-02-12 ENCOUNTER — Other Ambulatory Visit: Payer: Self-pay

## 2022-02-12 ENCOUNTER — Emergency Department (HOSPITAL_COMMUNITY)
Admission: EM | Admit: 2022-02-12 | Discharge: 2022-02-12 | Discharge status: Against Medical Advice | Payer: Medicaid Other | Attending: Emergency Medicine | Admitting: Emergency Medicine

## 2022-02-12 ENCOUNTER — Encounter (HOSPITAL_COMMUNITY): Payer: Self-pay | Admitting: Emergency Medicine

## 2022-02-12 DIAGNOSIS — R079 Chest pain, unspecified: Secondary | ICD-10-CM | POA: Diagnosis not present

## 2022-02-12 DIAGNOSIS — R1013 Epigastric pain: Secondary | ICD-10-CM | POA: Diagnosis not present

## 2022-02-12 DIAGNOSIS — Z5321 Procedure and treatment not carried out due to patient leaving prior to being seen by health care provider: Secondary | ICD-10-CM | POA: Insufficient documentation

## 2022-02-12 DIAGNOSIS — R0789 Other chest pain: Secondary | ICD-10-CM | POA: Insufficient documentation

## 2022-02-12 DIAGNOSIS — M549 Dorsalgia, unspecified: Secondary | ICD-10-CM | POA: Diagnosis not present

## 2022-02-12 DIAGNOSIS — I10 Essential (primary) hypertension: Secondary | ICD-10-CM | POA: Diagnosis not present

## 2022-02-12 LAB — HEPATIC FUNCTION PANEL
ALT: 39 U/L (ref 0–44)
AST: 22 U/L (ref 15–41)
Albumin: 3.7 g/dL (ref 3.5–5.0)
Alkaline Phosphatase: 57 U/L (ref 38–126)
Bilirubin, Direct: 0.1 mg/dL (ref 0.0–0.2)
Indirect Bilirubin: 0.6 mg/dL (ref 0.3–0.9)
Total Bilirubin: 0.7 mg/dL (ref 0.3–1.2)
Total Protein: 7.3 g/dL (ref 6.5–8.1)

## 2022-02-12 LAB — CBC
HCT: 35.4 % — ABNORMAL LOW (ref 36.0–46.0)
Hemoglobin: 11.4 g/dL — ABNORMAL LOW (ref 12.0–15.0)
MCH: 31.2 pg (ref 26.0–34.0)
MCHC: 32.2 g/dL (ref 30.0–36.0)
MCV: 97 fL (ref 80.0–100.0)
Platelets: 269 10*3/uL (ref 150–400)
RBC: 3.65 MIL/uL — ABNORMAL LOW (ref 3.87–5.11)
RDW: 12.5 % (ref 11.5–15.5)
WBC: 5.5 10*3/uL (ref 4.0–10.5)
nRBC: 0 % (ref 0.0–0.2)

## 2022-02-12 LAB — BASIC METABOLIC PANEL
Anion gap: 9 (ref 5–15)
BUN: 10 mg/dL (ref 6–20)
CO2: 25 mmol/L (ref 22–32)
Calcium: 9 mg/dL (ref 8.9–10.3)
Chloride: 102 mmol/L (ref 98–111)
Creatinine, Ser: 0.7 mg/dL (ref 0.44–1.00)
GFR, Estimated: >60 mL/min (ref >60)
Glucose, Bld: 104 mg/dL — ABNORMAL HIGH (ref 70–99)
Potassium: 3.6 mmol/L (ref 3.5–5.1)
Sodium: 136 mmol/L (ref 135–145)

## 2022-02-12 LAB — I-STAT BETA HCG BLOOD, ED (MC, WL, AP ONLY): I-stat hCG, quantitative: <5 m[IU]/mL (ref <5)

## 2022-02-12 LAB — TROPONIN I (HIGH SENSITIVITY)
Troponin I (High Sensitivity): <2 ng/L (ref <18)
Troponin I (High Sensitivity): <2 ng/L (ref <18)

## 2022-02-12 LAB — LIPASE, BLOOD: Lipase: 31 U/L (ref 11–51)

## 2022-02-12 MED ORDER — ALUM & MAG HYDROXIDE-SIMETH 200-200-20 MG/5ML PO SUSP
30.0000 mL | Freq: Once | ORAL | Status: AC
  Administered 2022-02-12: 30 mL via ORAL

## 2022-02-12 MED ORDER — LIDOCAINE VISCOUS HCL 2 % MT SOLN
15.0000 mL | Freq: Once | OROMUCOSAL | Status: AC
  Administered 2022-02-12: 15 mL via OROMUCOSAL

## 2022-02-12 MED ORDER — ALUM & MAG HYDROXIDE-SIMETH 200-200-20 MG/5ML PO SUSP
ORAL | Status: AC
  Filled 2022-02-12: qty 30

## 2022-02-12 MED ORDER — OMEPRAZOLE 20 MG PO CPDR: 20.0000 mg | DELAYED_RELEASE_CAPSULE | Freq: Every day | ORAL | 0 refills | Status: DC

## 2022-02-12 MED ORDER — LIDOCAINE VISCOUS HCL 2 % MT SOLN
OROMUCOSAL | Status: AC
  Filled 2022-02-12: qty 15

## 2022-02-12 NOTE — ED Provider Notes (Signed)
[facility]    CSN: [id number] Arrival date & time: 02/12/22  1739      History   Chief Complaint Chief Complaint  Patient presents with   Chest Pain    HPI Morgan Mathews is a 32 y.o. female.   HPI  She is in today for chest pain. She was seen the ER on last night but did not stay to be seen. She reports that she did have labs test. She is concern of the results. She is having 6/10 chest pain that radiates to her back. She denies jaw pain, shortness of breath or nausea. She reports that she does have food seems to get stuck. She is concern about her menstrual cycles and being told that she had fibroids with no follow up. 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 Authorizing Provider  omeprazole (PRILOSEC) 20 MG capsule Take 1 capsule (20 mg total) by mouth daily. 02/12/22 03/14/22 Yes Barbette Merino, NP  acetaminophen (TYLENOL) 325 MG tablet Take 2 tablets (650 mg total) by mouth every 4 (four) hours as needed (for pain scale < 4). 11/12/20   Autry-Lott, Randa Evens, DO  cyclobenzaprine (FLEXERIL) 5 MG tablet Take 1 tablet (5 mg total) by mouth 2 (two) times daily as needed for muscle spasms. 11/03/21   Gustavus Bryant, FNP  famotidine (PEPCID) 20 MG tablet Take 1 tablet (20 mg total) by mouth 2 (two) times daily.  For stomach pain. 12/05/21   Ellsworth Lennox, PA-C  iron polysaccharides (NIFEREX) 150 MG capsule Take 1 capsule (150 mg total) by mouth every other day. 12/19/20   Bernerd Limbo, CNM  naproxen (NAPROSYN) 375 MG tablet Take 1 tablet (375 mg total) by mouth 2 (two) times daily. For chest pain. 12/05/21   Ellsworth Lennox, PA-C  norethindrone (MICRONOR) 0.35 MG tablet Take 1 tablet (0.35 mg total) by mouth daily. 12/13/20   Bernerd Limbo, CNM    Family History Family History  Problem Relation Age of Onset   Diabetes Mother    Healthy Father     Social History Social History   Tobacco Use   Smoking status: Never   Smokeless tobacco: Never  Vaping Use   Vaping Use: Never used  Substance Use Topics   Alcohol use: No   Drug use: No     Allergies   Patient has no known allergies.   Review of Systems Review of Systems   Physical Exam Triage Vital Signs ED Triage Vitals [02/12/22 1854]  Enc Vitals Group  BP 98/67     Pulse Rate 66     Resp 16     Temp 98 F (36.7 C)     Temp Source Oral     SpO2 96 %     Weight      Height      Head Circumference      Peak Flow      Pain Score 6     Pain Loc      Pain Edu?      Excl. in [place]?    No data found.  Updated Vital Signs BP 98/67 (BP Location: Left Arm)   Pulse 66   Temp 98 F (36.7 C) (Oral)   Resp 16   LMP 02/05/2022   SpO2 96%   Visual Acuity Right Eye Distance:   Left Eye Distance:   Bilateral Distance:    Right Eye Near:   Left Eye Near:    Bilateral Near:     Physical Exam Constitutional:      Appearance: She is obese.  HENT:     Head: Normocephalic and atraumatic.  Eyes:     Pupils: Pupils are equal, round, and reactive to light.  Cardiovascular:     Rate and Rhythm: Normal rate and regular rhythm.     Pulses:          Radial pulses are 2+ on the right side and 2+ on the left side.     Heart sounds: Normal heart sounds.  Pulmonary:     Effort: Pulmonary effort is normal.     Breath  sounds: Decreased breath sounds present. No wheezing, rhonchi or rales.  Chest:     Chest wall: No mass or tenderness.  Abdominal:     General: Bowel sounds are normal. There is no abdominal bruit.     Palpations: Abdomen is soft.  Musculoskeletal:        General: Normal range of motion.     Cervical back: Normal range of motion.  Skin:    General: Skin is warm and dry.     Capillary Refill: Capillary refill takes less than 2 seconds.  Neurological:     General: No focal deficit present.     Mental Status: She is alert.  Psychiatric:        Mood and Affect: Mood normal.     Comments: Sleepy but easily aroused      UC Treatments / Results  Labs (all labs ordered are listed, but only abnormal results are displayed) Labs Reviewed  LIPASE, BLOOD    EKG   Radiology DG Chest 2 View  Result Date: 02/12/2022 CLINICAL DATA:  CP EXAM: CHEST - 2 VIEW COMPARISON:  Chest x-ray 02/06/2016 FINDINGS: Prominent cardiac silhouette. The heart and mediastinal contours are unchanged. No focal consolidation. No pulmonary edema. No pleural effusion. No pneumothorax. No acute osseous abnormality. IMPRESSION: Stable prominent cardiac silhouette with no active cardiopulmonary disease. Electronically Signed   By: Iven Finn M.D.   On: 02/12/2022 02:22    Procedures Procedures (including critical care time)  Medications Ordered in UC Medications  lidocaine (XYLOCAINE) 2 % viscous mouth solution 15 mL (15 mLs Mouth/Throat Given 02/12/22 1929)  alum & mag hydroxide-simeth (MAALOX/MYLANTA) 200-200-20 MG/5ML suspension 30 mL (30 mLs Oral Given 02/12/22 1929)    Initial Impression / Assessment and Plan / UC Course  I have reviewed the triage vital signs and the nursing notes.  Pertinent labs & imaging results that were available during my care  of the patient were reviewed by me and considered in my medical decision making (see chart for details).     Chest pain Final Clinical Impressions(s) /  UC Diagnoses   Final diagnoses:  Chest pain, unspecified type  Epigastric pain     Discharge Instructions      You were give an GI cocktail to see if this would improve your systems.  Your labs from last night in the ER were all within range with the exception of mild anemia.  I did add on one additional test.  I do recommend that you get established with a PCP for further workup. I have prescribed you omeprazole this is for heart burn. I have provided you with some information to read about gallstone.  I also recommend that you follow up with GYN if you are concern about fibroids. They will be able to reevaluate with an ultrasound.      ED Prescriptions     Medication Sig Dispense Auth. Provider   omeprazole (PRILOSEC) 20 MG capsule Take 1 capsule (20 mg total) by mouth daily. 30 capsule Vevelyn Francois, NP      PDMP not reviewed this encounter.   Dionisio David [place], [place] 02/12/22 2007

## 2022-02-12 NOTE — ED Triage Notes (Signed)
Pt bib [facility] from work for left sided chest pain that radiates to left arm. Pt describes the pain as sharp and is relieved with external pressure. Pt states she had this pain when she was pregnant. 324 aspirin given PTA.   BP 138/98, HR 68, Spo2 98%, CBG 92

## 2022-02-12 NOTE — ED Notes (Signed)
Pt called more than 3x for vitals and for room w/o reponse. Pt appears not to be here at this time.

## 2022-02-12 NOTE — ED Notes (Signed)
Pt not present at this time for vitals check

## 2022-02-12 NOTE — Discharge Instructions (Addendum)
You were give an GI cocktail to see if this would improve your systems.  Your labs from last night in the ER were all within range with the exception of mild anemia.  I did add on one additional test.  I do recommend that you get established with a PCP for further workup. I have prescribed you omeprazole this is for heart burn. I have provided you with some information to read about gallstone.  I also recommend that you follow up with GYN if you are concern about fibroids. They will be able to reevaluate with an ultrasound.

## 2022-02-12 NOTE — ED Provider Triage Note (Signed)
Emergency Medicine Provider Triage Evaluation Note  Ivannia White , a 32 y.o. female  was evaluated in triage.  Pt complains of chest pain and back pain. States she had some similar pain when she was pregnant >1 year ago. States she also feels some pain "for a while" when she swallows food. States she had some degree of similar sx in October   Review of Systems  Positive: CP Negative: SOB, NV  Physical Exam  BP 110/67   Pulse 71   Temp 97.9 F (36.6 C) (Oral)   Resp (!) 24   SpO2 100%  Gen:   Awake, no distress  Resp:  Normal effort  MSK:   Moves extremities without difficulty  Other:  Lungs clear, NAD  Medical Decision Making  Medically screening exam initiated at 2:06 AM.  Appropriate orders placed.  Kylii Greenhouse was informed that the remainder of the evaluation will be completed by another provider, this initial triage assessment does not replace that evaluation, and the importance of remaining in the ED until their evaluation is complete.  Labs, CXR, preg test   Tedd Sias, [place] 02/12/22 3953

## 2022-02-12 NOTE — ED Triage Notes (Signed)
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 Pain radiates through into middle back. Described as sharp. Hurts worse with eating, deep breathing, and movement. Denies recently illness, fever, abdominal pain, N/V/D, SOB, cough

## 2022-02-12 NOTE — ED Notes (Signed)
Unable to find pt in waiting room vital signs.

## 2022-04-18 DIAGNOSIS — Z3009 Encounter for other general counseling and advice on contraception: Secondary | ICD-10-CM | POA: Diagnosis not present

## 2022-04-18 DIAGNOSIS — E669 Obesity, unspecified: Secondary | ICD-10-CM | POA: Diagnosis not present

## 2022-04-18 DIAGNOSIS — Z114 Encounter for screening for human immunodeficiency virus [HIV]: Secondary | ICD-10-CM | POA: Diagnosis not present

## 2022-04-18 DIAGNOSIS — Z01419 Encounter for gynecological examination (general) (routine) without abnormal findings: Secondary | ICD-10-CM | POA: Diagnosis not present

## 2022-04-18 DIAGNOSIS — R7309 Other abnormal glucose: Secondary | ICD-10-CM | POA: Diagnosis not present

## 2022-04-18 DIAGNOSIS — Z113 Encounter for screening for infections with a predominantly sexual mode of transmission: Secondary | ICD-10-CM | POA: Diagnosis not present

## 2022-05-01 ENCOUNTER — Encounter (HOSPITAL_COMMUNITY): Payer: Self-pay

## 2022-05-01 ENCOUNTER — Ambulatory Visit (HOSPITAL_COMMUNITY)
Admission: EM | Admit: 2022-05-01 | Discharge: 2022-05-01 | Disposition: A | Discharge status: Home / Self Care | Payer: Medicaid Other | Attending: Sports Medicine | Admitting: Sports Medicine

## 2022-05-01 DIAGNOSIS — H1013 Acute atopic conjunctivitis, bilateral: Secondary | ICD-10-CM | POA: Diagnosis not present

## 2022-05-01 MED ORDER — TRIAMCINOLONE ACETONIDE 40 MG/ML IJ SUSP
60.0000 mg | Freq: Once | INTRAMUSCULAR | Status: AC
  Administered 2022-05-01: 60 mg via INTRAMUSCULAR

## 2022-05-01 MED ORDER — TRIAMCINOLONE ACETONIDE 40 MG/ML IJ SUSP
INTRAMUSCULAR | Status: AC
  Filled 2022-05-01: qty 2

## 2022-05-01 MED ORDER — OLOPATADINE HCL 0.1 % OP SOLN: 1.0000 [drp] | Freq: Two times a day (BID) | OPHTHALMIC | 3 refills | Status: DC

## 2022-05-01 NOTE — Discharge Instructions (Signed)
Use Pataday drops, 1 drop into each eye 2x per day Continue Claritin I hope this shot helps!  It was nice to meet you today!

## 2022-05-01 NOTE — ED Triage Notes (Signed)
Patient having watery eyes, redness, pain in the eyes, and sneezing. Onset 3-4 days. Patient has seasonal allergies.  Patient tried benadryl and other otc allergy meds with no relief.

## 2022-05-01 NOTE — ED Provider Notes (Signed)
[place]   [id number] 05/01/22 Arrival Time: [id number]  ASSESSMENT & PLAN:  1. Allergic conjunctivitis of both eyes    History exam is consistent with allergic conjunctivitis.  I will give her a prescription for Pataday eyedrops for symptomatic relief.  She is mildly uncomfortable in the room today due to the allergic conjunctivitis so I will give her a IM injection of Kenalog today for symptomatic relief as well.  She tolerated the injection well.  Encouraged her to follow-up with her primary care doctor to discuss other treatment options for allergies.  All questions answered and agrees to plan.  Meds ordered this encounter  Medications   triamcinolone acetonide (KENALOG-40) injection 60 mg   olopatadine (PATADAY) 0.1 % ophthalmic solution    Sig: Place 1 drop into both eyes 2 (two) times daily.    Dispense:  5 mL    Refill:  3     Discharge Instructions      Use Pataday drops, 1 drop into each eye 2x per day Continue Claritin I hope this shot helps!  It was nice to meet you today!        Reviewed expectations re: course of current medical issues. Questions answered. Outlined signs and symptoms indicating need for more acute intervention. Patient verbalized understanding. After Visit Summary given.   SUBJECTIVE: Pleasant 71 old female comes urgent care to be evaluated for bilateral eye redness and itchiness.  She says she gets this every year in April.  Her eyes been watering and itchy.  She is also been sneezing.  No foreign body or trauma to the eyes.  She has tried numerous over-the-counter medications such as Claritin and [employer] brand antihistamine but has not gotten any relief.  She has not tried any eyedrops.  Patient's last menstrual period was 04/17/2022 (approximate). Past Surgical History:  Procedure Laterality Date   NO PAST SURGERIES       OBJECTIVE:  Vitals:   05/01/22 1046  BP: 99/67  Pulse: 69  Resp: 18  Temp: 98.3 F (36.8 C)   TempSrc: Oral  SpO2: 96%  Weight: 128.4 kg  Height: 5\' 5"  (1.651 m)     Physical Exam Vitals and nursing note reviewed.  Constitutional:      Appearance: Normal appearance.  HENT:     Nose: Congestion present.     Mouth/Throat:     Pharynx: Posterior oropharyngeal erythema present. No oropharyngeal exudate.  Eyes:     General: Lids are normal. Vision grossly intact. Gaze aligned appropriately.     Extraocular Movements: Extraocular movements intact.     Conjunctiva/sclera:     Right eye: Right conjunctiva is injected.     Left eye: Left conjunctiva is injected.     Pupils: Pupils are equal, round, and reactive to light.  Cardiovascular:     Rate and Rhythm: Normal rate.  Pulmonary:     Effort: Pulmonary effort is normal.  Musculoskeletal:        General: Normal range of motion.     Cervical back: Normal range of motion.  Neurological:     General: No focal deficit present.  Psychiatric:        Mood and Affect: Mood normal.      Labs: Results for orders placed or performed during the hospital encounter of 02/12/22  Lipase, blood  Result Value Ref Range   Lipase 31 11 - 51 U/L   Labs Reviewed - No data to display  Imaging: No results found.  No Known Allergies                                             Past Medical History:  Diagnosis Date   Chronic bilateral thoracic back pain 08/28/2016   Gestational diabetes    Strabismus 1994   Right exotropia    Social History   Socioeconomic History   Marital status: Married    Spouse name: Musah Iddrisu   Number of children: 2   Years of education: 8   Highest education level: 12th grade  Occupational History   Not on file  Tobacco Use   Smoking status: Never   Smokeless tobacco: Never  Vaping Use   Vaping Use: Never used  Substance and Sexual Activity   Alcohol use: No   Drug use: No   Sexual activity: Yes    Birth control/protection: None  Other Topics Concern   Not on file  Social History  Narrative   Originally from [place]   Came to [place]. In 2015.   Lives at home with husband and 2 children.   Social Determinants of Health   Financial Resource Strain: Not on file  Food Insecurity: No Food Insecurity (04/27/2020)   Hunger Vital Sign    Worried About Running Out of Food in the Last Year: Never true    Ran Out of Food in the Last Year: Never true  Transportation Needs: No Transportation Needs (04/27/2020)   PRAPARE - Hydrologist (Medical): No    Lack of Transportation (Non-Medical): No  Physical Activity: Not on file  Stress: Not on file  Social Connections: Not on file  Intimate Partner Violence: Not At Risk (04/27/2020)   Humiliation, Afraid, Rape, and Kick questionnaire    Fear of Current or Ex-Partner: No    Emotionally Abused: No    Physically Abused: No    Sexually Abused: No    Family History  Problem Relation Age of Onset   Diabetes Mother    Healthy Father       Bartholomew Ramesh, Dorian Pod, MD 05/01/22 1222

## 2022-08-30 ENCOUNTER — Ambulatory Visit (INDEPENDENT_AMBULATORY_CARE_PROVIDER_SITE_OTHER): Payer: Medicaid Other | Admitting: Family

## 2022-08-30 ENCOUNTER — Encounter: Payer: Self-pay | Admitting: Family

## 2022-08-30 VITALS — BP 124/80 | HR 79 | Temp 97.8°F | Ht 66.25 in | Wt 296.2 lb

## 2022-08-30 DIAGNOSIS — Z86018 Personal history of other benign neoplasm: Secondary | ICD-10-CM

## 2022-08-30 DIAGNOSIS — Z7689 Persons encountering health services in other specified circumstances: Secondary | ICD-10-CM

## 2022-08-30 DIAGNOSIS — N946 Dysmenorrhea, unspecified: Secondary | ICD-10-CM

## 2022-08-30 DIAGNOSIS — R635 Abnormal weight gain: Secondary | ICD-10-CM | POA: Diagnosis not present

## 2022-08-30 DIAGNOSIS — Z6841 Body Mass Index (BMI) 40.0 and over, adult: Secondary | ICD-10-CM | POA: Diagnosis not present

## 2022-08-30 MED ORDER — NAPROXEN 500 MG PO TABS: 500.0000 mg | ORAL_TABLET | Freq: Two times a day (BID) | ORAL | 1 refills | Status: DC

## 2022-08-30 MED ORDER — PHENTERMINE HCL 15 MG PO CAPS: 15.0000 mg | ORAL_CAPSULE | ORAL | 0 refills | Status: DC

## 2022-08-30 NOTE — Progress Notes (Signed)
Subjective:    Morgan Mathews - 32 y.o. female MRN [id number]  Date of birth: [birth year]  HPI  Morgan Mathews is to establish care.  Current issues and/or concerns: - Reports painful heavy periods. Period usually lasts 5 to 7 days. Reports in the past she was told she had fibroids and was supposed to follow-up with Gynecology but did not.  - Reports would like to lose 50 pounds. She does not monitor what she eats. She does not exercise outside of normal routine.  - No further issues/concerns for discussion today.    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 She has never used smokeless tobacco. She reports that she does not drink alcohol and does not use drugs.   Family History  family history includes Diabetes in her mother; Healthy in her father.   Medications: reviewed and updated   Objective:   Physical Exam BP 124/80   Pulse 79   Temp 97.8 F (36.6 C) (Oral)   Ht 5' 6.25" (1.683 m)   Wt 296 lb 3.2 oz (134.4 kg)   LMP 08/29/2022 (Exact Date)   SpO2 96%   BMI 47.45 kg/m   Physical Exam HENT:     Head: Normocephalic and atraumatic.     Nose: Nose normal.     Mouth/Throat:     Mouth: Mucous membranes are moist.     Pharynx: Oropharynx is clear.  Eyes:     Extraocular Movements: Extraocular movements intact.     Conjunctiva/sclera: Conjunctivae normal.     Pupils: Pupils are equal, round, and  reactive to light.  Cardiovascular:     Rate and Rhythm: Normal rate and regular rhythm.     Pulses: Normal pulses.     Heart sounds: Normal heart sounds.  Pulmonary:     Effort: Pulmonary effort is normal.     Breath sounds: Normal breath sounds.  Musculoskeletal:        General: Normal range of motion.     Cervical back: Normal range of motion and neck supple.  Neurological:     General: No focal deficit present.     Mental Status: She is alert and oriented to person, place, and time.  Psychiatric:        Mood and Affect: Mood normal.        Behavior: Behavior normal.        Assessment & Plan:  1. Encounter to establish care - Patient presents today to establish care. During the interim follow-up with primary provider as scheduled.  - Return for annual physical examination, labs, and health maintenance. Arrive fasting meaning having no food for at least 8 hours prior to appointment. You may have only water or black coffee. Please take scheduled medications as normal.  2. Dysmenorrhea 3. History of uterine fibroid - Naproxen as prescribed. Counseled on medication adherence/adverse effects.  -  Referral to Gynecology for further evaluation/management. During the interim follow-up with primary provider as scheduled until established with referral. - naproxen (NAPROSYN) 500 MG tablet; Take 1 tablet (500 mg total) by mouth 2 (two) times daily with a meal.  Dispense: 60 tablet; Refill: 1 - Ambulatory referral to Gynecology  4. Encounter for weight management 5. BMI 45.0-49.9, adult (HCC) 6. Weight gain - Phentermine as prescribed. Counseled on medication adherence and adverse effects. - I did check the [facility] prescription drug database and found no frequent prescribers of opiates or evidence of aberrant behavior. - Counseled on low-sodium DASH diet and 150 minutes of moderate intensity exercise per week as tolerated to assist with weight management.  - Follow-up with primary  provider in 4 weeks or sooner if needed. - phentermine 15 MG capsule; Take 1 capsule (15 mg total) by mouth every morning.  Dispense: 30 capsule; Refill: 0    Patient was given clear instructions to go to Emergency Department or return to medical center if symptoms don't improve, worsen, or new problems develop.The patient verbalized understanding.  I discussed the assessment and treatment plan with the patient. The patient was provided an opportunity to ask questions and all were answered. The patient agreed with the plan and demonstrated an understanding of the instructions.   The patient was advised to call back or seek an in-person evaluation if the symptoms worsen or if the condition fails to improve as anticipated.    Ricky Stabs, NP 08/30/2022, 8:25 AM Primary Care at [facility]

## 2022-08-30 NOTE — Progress Notes (Signed)
Pt was having a lot more blood than her normal amount from previous. States blood was darker on her current period and it is very painful.

## 2022-08-30 NOTE — Patient Instructions (Signed)
Thank you for choosing Primary Care at [facility] for your medical home!    Morgan Mathews was seen by Rema Fendt, NP today.   Morgan Mathews's primary care provider is Ricky Stabs, NP.   For the best care possible,  you should try to see Ricky Stabs, NP whenever you come to office.   We look forward to seeing you again soon!  If you have any questions about your visit today,  please call us at [phone]  Or feel free to reach your provider via MyChart.   Keeping you healthy   Get these tests Blood pressure- Have your blood pressure checked once a year by your healthcare provider.  Normal blood pressure is 120/80. Weight- Have your body mass index (BMI) calculated to screen for obesity.  BMI is a measure of body fat based on height and weight. You can also calculate your own BMI at https://www.west-esparza.com/. Cholesterol- Have your cholesterol checked regularly starting at age 49, sooner may be necessary if you have diabetes, high blood pressure, if a family member developed heart diseases at an early age or if you smoke.  Chlamydia, HIV, and other sexual transmitted disease- Get screened each year until the age of 63 then within three months of each new sexual partner. Diabetes- Have your blood sugar checked regularly if you have high blood pressure, high cholesterol, a family history of diabetes or if you are overweight.   Get these vaccines Flu shot- Every fall. Tetanus shot- Every 10 years. Menactra- Single dose; prevents meningitis.   Take these steps Don't smoke- If you do smoke, ask your healthcare provider about quitting. For tips on how to quit, go to www.smokefree.gov or call 1-800-QUIT-NOW. Be physically active- Exercise 5 days a week for at least 30 minutes.  If you are not already physically active start slow and gradually work up to 30 minutes of moderate physical activity.  Examples of moderate activity include walking briskly, mowing the yard, dancing,  swimming bicycling, etc. Eat a healthy diet- Eat a variety of healthy foods such as fruits, vegetables, low fat milk, low fat cheese, yogurt, lean meats, poultry, fish, beans, tofu, etc.  For more information on healthy eating, go to www.thenutritionsource.org Drink alcohol in moderation- Limit alcohol intake two drinks or less a day.  Never drink and drive. Dentist- Brush and floss teeth twice daily; visit your dentis twice a year. Depression-Your emotional health is as important as your physical health.  If you're feeling down, losing interest in things you normally enjoy please talk with your healthcare provider. Gun Safety- If you keep a gun in your home, keep it unloaded and with the safety lock on.  Bullets should be stored separately. Helmet use- Always wear a helmet when riding a motorcycle, bicycle, rollerblading or skateboarding. Safe sex- If you may be exposed to a sexually transmitted infection, use a condom Seat belts- Seat bels can save your life; always wear one. Smoke/Carbon Monoxide detectors- These detectors need to be installed on the appropriate level of your home.  Replace batteries at least once a year. Skin Cancer- When out in the sun, cover up and use sunscreen SPF 15 or higher. Violence- If anyone is threatening or hurting you, please tell your healthcare provider.

## 2022-09-11 ENCOUNTER — Ambulatory Visit: Payer: Medicaid Other | Admitting: Family

## 2022-11-14 ENCOUNTER — Encounter: Payer: Self-pay | Admitting: Family

## 2022-11-14 ENCOUNTER — Ambulatory Visit (INDEPENDENT_AMBULATORY_CARE_PROVIDER_SITE_OTHER): Payer: Medicaid Other | Admitting: Family

## 2022-11-14 VITALS — BP 99/67 | HR 67 | Temp 98.1°F | Ht 65.75 in | Wt 290.2 lb

## 2022-11-14 DIAGNOSIS — Z131 Encounter for screening for diabetes mellitus: Secondary | ICD-10-CM | POA: Diagnosis not present

## 2022-11-14 DIAGNOSIS — Z13220 Encounter for screening for lipoid disorders: Secondary | ICD-10-CM | POA: Diagnosis not present

## 2022-11-14 DIAGNOSIS — Z13228 Encounter for screening for other metabolic disorders: Secondary | ICD-10-CM | POA: Diagnosis not present

## 2022-11-14 DIAGNOSIS — Z124 Encounter for screening for malignant neoplasm of cervix: Secondary | ICD-10-CM

## 2022-11-14 DIAGNOSIS — Z0100 Encounter for examination of eyes and vision without abnormal findings: Secondary | ICD-10-CM

## 2022-11-14 DIAGNOSIS — E66813 Obesity, class 3: Secondary | ICD-10-CM | POA: Diagnosis not present

## 2022-11-14 DIAGNOSIS — R635 Abnormal weight gain: Secondary | ICD-10-CM

## 2022-11-14 DIAGNOSIS — Z113 Encounter for screening for infections with a predominantly sexual mode of transmission: Secondary | ICD-10-CM

## 2022-11-14 DIAGNOSIS — E119 Type 2 diabetes mellitus without complications: Secondary | ICD-10-CM

## 2022-11-14 DIAGNOSIS — Z1329 Encounter for screening for other suspected endocrine disorder: Secondary | ICD-10-CM

## 2022-11-14 DIAGNOSIS — Z7689 Persons encountering health services in other specified circumstances: Secondary | ICD-10-CM

## 2022-11-14 DIAGNOSIS — Z0000 Encounter for general adult medical examination without abnormal findings: Secondary | ICD-10-CM

## 2022-11-14 DIAGNOSIS — Z130 Encounter for screening for diseases of the blood and blood-forming organs and certain disorders involving the immune mechanism: Secondary | ICD-10-CM | POA: Diagnosis not present

## 2022-11-14 DIAGNOSIS — Z8632 Personal history of gestational diabetes: Secondary | ICD-10-CM | POA: Diagnosis not present

## 2022-11-14 DIAGNOSIS — Z6841 Body Mass Index (BMI) 40.0 and over, adult: Secondary | ICD-10-CM | POA: Diagnosis not present

## 2022-11-14 DIAGNOSIS — Z0001 Encounter for general adult medical examination with abnormal findings: Secondary | ICD-10-CM | POA: Diagnosis not present

## 2022-11-14 MED ORDER — PHENTERMINE HCL 30 MG PO CAPS: 30.0000 mg | ORAL_CAPSULE | ORAL | 0 refills | Status: DC

## 2022-11-14 NOTE — Progress Notes (Signed)
Patient states weight loss medication isnt working.

## 2022-11-14 NOTE — Progress Notes (Signed)
Patient ID: Morgan Mathews, female    DOB: Jun 16, 1990  MRN: [id number]  CC: Annual Exam  Subjective: Morgan Mathews is a 32 y.o. female who presents for annual exam.   Her concerns today include:  - States doesn't feel Phentermine is helping lose weight as much as she would like.  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   No current facility-administered medications on file prior to visit.    No Known Allergies  Social History   Socioeconomic History   Marital status: Married    Spouse name: Musah Iddrisu   Number of children: 2   Years of education: 8   Highest education level: 12th grade  Occupational History   Not on file  Tobacco Use   Smoking status: Never   Smokeless tobacco: Never  Vaping Use   Vaping status: Never Used  Substance and Sexual Activity   Alcohol use: No   Drug use: No   Sexual activity: Yes    Birth control/protection: None  Other Topics Concern   Not on file  Social History Narrative   Originally from [place]   Came to [employer] [place] [employer]. In 2015.   Lives at home with husband and 2 children.   Social Determinants of Health   Financial Resource Strain: Not on file  Food Insecurity: No Food Insecurity (04/27/2020)   Hunger Vital Sign    Worried About Running Out of Food in the Last Year: Never true    Ran Out of Food in the Last Year: Never true  Transportation Needs: No Transportation Needs (04/27/2020)   PRAPARE - Administrator, Civil Service (Medical): No    Lack of Transportation (Non-Medical): No  Physical Activity: Not on file  Stress: Not on file  Social Connections: Not on file  Intimate Partner Violence: Not At Risk (04/27/2020)   Humiliation, Afraid, Rape, and Kick questionnaire    Fear of Current or Ex-Partner:  No    Emotionally Abused: No    Physically Abused: No    Sexually Abused: No    Family History  Problem Relation Age of Onset   Diabetes Mother    Healthy Father     Past Surgical History:  Procedure Laterality Date   NO PAST SURGERIES      ROS: Review of Systems Negative except as stated above  PHYSICAL EXAM: BP 99/67   Pulse 67   Temp 98.1 F (36.7 C) (Oral)   Ht 5' 5.75" (1.67 m)   Wt 290 lb 3.2 oz (131.6 kg)   LMP 11/03/2022 (Exact Date)   SpO2 97%   BMI 47.20 kg/m   Physical Exam HENT:     Head: Normocephalic and atraumatic.     Right Ear: Tympanic membrane, ear canal and external ear normal.     Left Ear: Tympanic membrane, ear canal and external ear normal.     Nose: Nose normal.     Mouth/Throat:     Mouth: Mucous membranes are moist.     Pharynx: Oropharynx is clear.  Eyes:     Extraocular Movements: Extraocular movements intact.     Conjunctiva/sclera: Conjunctivae normal.     Pupils: Pupils are equal, round, and reactive to light.  Neck:     Thyroid: No thyroid mass, thyromegaly or thyroid tenderness.  Cardiovascular:     Rate and Rhythm: Normal rate and regular rhythm.     Pulses: Normal pulses.     Heart sounds: Normal heart sounds.  Pulmonary:     Effort: Pulmonary effort is normal.     Breath sounds: Normal breath sounds.  Chest:     Comments: Patient declined.  Abdominal:     General: Bowel sounds are normal.     Palpations: Abdomen is soft.  Genitourinary:    Comments: Patient declined.  Musculoskeletal:        General: Normal range of motion.     Right shoulder: Normal.     Left shoulder: Normal.     Right upper arm: Normal.     Left upper arm: Normal.     Right elbow: Normal.     Left elbow: Normal.     Right forearm: Normal.     Left forearm: Normal.     Right wrist: Normal.     Left wrist: Normal.     Right hand: Normal.     Left hand: Normal.     Cervical back: Normal, normal range of motion and neck supple.      Thoracic back: Normal.     Lumbar back: Normal.     Right hip: Normal.     Left hip: Normal.     Right upper leg: Normal.     Left upper leg: Normal.     Right knee: Normal.     Left knee: Normal.     Right lower leg: Normal.     Left lower leg: Normal.     Right ankle: Normal.     Left ankle: Normal.     Right foot: Normal.     Left foot: Normal.  Skin:    General: Skin is warm and dry.     Capillary Refill: Capillary refill takes less than 2 seconds.  Neurological:     General: No focal deficit present.     Mental Status: She is alert and oriented to person, place, and time.  Psychiatric:        Mood and Affect: Mood normal.        Behavior: Behavior normal.     ASSESSMENT AND PLAN: 1. Annual physical exam - Counseled on 150 minutes of exercise per week as tolerated, healthy eating (including decreased daily intake of saturated fats, cholesterol, added sugars, sodium), STI prevention, and routine healthcare maintenance.  2. Screening for metabolic disorder - Routine screening.  - CMP14+EGFR  3. Screening for deficiency anemia - Routine screening.  - CBC  4. Diabetes mellitus screening 5. History of gestational diabetes - Routine screening.  - Microalbumin / creatinine urine ratio - Hemoglobin A1c  6. Screening cholesterol level - Routine screening.  - Lipid panel  7. Thyroid disorder screen - Routine screening.  - TSH  8. Routine eye exam - Referral to Ophthalmology for evaluation/management. - Ambulatory referral to Ophthalmology  9. Encounter for diabetic foot exam [facility]) - Referral to Podiatry for evaluation/management. - Ambulatory referral to Podiatry  10. Encounter for weight management 11. BMI 45.0-49.9, adult (HCC) 12. Weight gain - Increase Phentermine from 15 mg daily to 30 mg daily. Counseled on medication adherence/adverse effects. - Referral to Medical Weight Management for evaluation/management. During the interim follow-up with primary  provider as scheduled until established with referral. - Amb Ref to Medical Weight Management - phentermine 30 MG capsule; Take 1 capsule (30 mg total) by mouth every morning.  Dispense: 30  capsule; Refill: 0  13. Cervical cancer screening 14. Routine screening for STI (sexually transmitted infection) - Referral to Gynecology for evaluation/management.  - Ambulatory referral to Gynecology    Patient was given the opportunity to ask questions.  Patient verbalized understanding of the plan and was able to repeat key elements of the plan. Patient was given clear instructions to go to Emergency Department or return to medical center if symptoms don't improve, worsen, or new problems develop.The patient verbalized understanding.   Orders Placed This Encounter  Procedures   Microalbumin / creatinine urine ratio   CBC   Lipid panel   CMP14+EGFR   Hemoglobin A1c   TSH   Ambulatory referral to Gynecology   Ambulatory referral to Podiatry   Ambulatory referral to Ophthalmology   Amb Ref to Medical Weight Management     Requested Prescriptions   Signed Prescriptions Disp Refills   phentermine 30 MG capsule 30 capsule 0    Sig: Take 1 capsule (30 mg total) by mouth every morning.    Return in about 1 year (around 11/14/2023) for Physical per patient preference.  Rema Fendt, NP

## 2022-11-15 ENCOUNTER — Other Ambulatory Visit: Payer: Self-pay | Admitting: Family

## 2022-11-15 DIAGNOSIS — Z13220 Encounter for screening for lipoid disorders: Secondary | ICD-10-CM

## 2022-11-15 LAB — CBC
Hematocrit: 36.2 % (ref 34.0–46.6)
Hemoglobin: 11.7 g/dL (ref 11.1–15.9)
MCH: 31.4 pg (ref 26.6–33.0)
MCHC: 32.3 g/dL (ref 31.5–35.7)
MCV: 97 fL (ref 79–97)
Platelets: 301 10*3/uL (ref 150–450)
RBC: 3.73 x10E6/uL — ABNORMAL LOW (ref 3.77–5.28)
RDW: 11.9 % (ref 11.7–15.4)
WBC: 7 10*3/uL (ref 3.4–10.8)

## 2022-11-15 LAB — CMP14+EGFR
ALT: 30 [IU]/L (ref 0–32)
AST: 30 [IU]/L (ref 0–40)
Albumin: 4.6 g/dL (ref 3.9–4.9)
Alkaline Phosphatase: 85 [IU]/L (ref 44–121)
BUN/Creatinine Ratio: 15 (ref 9–23)
BUN: 10 mg/dL (ref 6–20)
Bilirubin Total: 0.6 mg/dL (ref 0.0–1.2)
CO2: 24 mmol/L (ref 20–29)
Calcium: 9.6 mg/dL (ref 8.7–10.2)
Chloride: 99 mmol/L (ref 96–106)
Creatinine, Ser: 0.65 mg/dL (ref 0.57–1.00)
Globulin, Total: 3.2 g/dL (ref 1.5–4.5)
Glucose: 96 mg/dL (ref 70–99)
Potassium: 4.7 mmol/L (ref 3.5–5.2)
Sodium: 138 mmol/L (ref 134–144)
Total Protein: 7.8 g/dL (ref 6.0–8.5)
eGFR: 121 mL/min/{1.73_m2} (ref >59)

## 2022-11-15 LAB — LIPID PANEL
Chol/HDL Ratio: 2.7 {ratio} (ref 0.0–4.4)
Cholesterol, Total: 212 mg/dL — ABNORMAL HIGH (ref 100–199)
HDL: 79 mg/dL (ref >39)
LDL Chol Calc (NIH): 121 mg/dL — ABNORMAL HIGH (ref 0–99)
Triglycerides: 70 mg/dL (ref 0–149)
VLDL Cholesterol Cal: 12 mg/dL (ref 5–40)

## 2022-11-15 LAB — HEMOGLOBIN A1C
Est. average glucose Bld gHb Est-mCnc: 100 mg/dL
Hgb A1c MFr Bld: 5.1 % (ref 4.8–5.6)

## 2022-11-15 LAB — TSH: TSH: 1.98 u[IU]/mL (ref 0.450–4.500)

## 2022-11-16 LAB — MICROALBUMIN / CREATININE URINE RATIO
Creatinine, Urine: 230.9 mg/dL
Microalb/Creat Ratio: 6 mg/g{creat} (ref 0–29)
Microalbumin, Urine: 14.3 ug/mL

## 2022-11-21 ENCOUNTER — Encounter: Payer: Self-pay | Admitting: Podiatry

## 2022-11-21 ENCOUNTER — Ambulatory Visit (INDEPENDENT_AMBULATORY_CARE_PROVIDER_SITE_OTHER): Payer: Medicaid Other | Admitting: Podiatry

## 2022-11-21 DIAGNOSIS — M2141 Flat foot [pes planus] (acquired), right foot: Secondary | ICD-10-CM

## 2022-11-21 DIAGNOSIS — M2142 Flat foot [pes planus] (acquired), left foot: Secondary | ICD-10-CM | POA: Diagnosis not present

## 2022-11-21 NOTE — Progress Notes (Signed)
  Subjective:  Patient ID: Morgan Mathews, female    DOB: 1990/05/12,  MRN: [id number]  Chief Complaint  Patient presents with   Diabetes    Requesting foot exam - diabetic - last A1c was 5.1, no concerns, PCP referred   New Patient (Initial Visit)    Discussed the use of AI scribe software for clinical note transcription with the patient, who gave verbal consent to proceed.  History of Present Illness   The patient, with a history of gestational diabetes, presents for a routine foot check. She reports that her gestational diabetes resolved postpartum and she is not currently on any medication for diabetes. She has noticed significant weight gain since her pregnancy. She denies any current foot pain, numbness, tingling, or burning. However, she reports frequent falls and trips, which she attributes to her flat feet. She also notes some dry skin on her feet.          Objective:    Physical Exam   EXTREMITIES: Bilateral feet warm, well-padded, with palpable pulses. MUSCULOSKELETAL: Mild pes planus deformity noted, asymptomatic. SKIN: Dry skin noted.       No images are attached to the encounter.    Results   LABS Hemoglobin A1c: 4.4%      Assessment:   1. Pes planus of both feet      Plan:  Patient was evaluated and treated and all questions answered.  Assessment and Plan    History of Gestational Diabetes   Her A1c is 4.4, within the normal range, and she is not on any diabetes medication. No immediate action is required, but she should continue monitoring blood sugar levels as per primary care physician's instructions.  Flat Feet (Pes Planus)   She reports frequent tripping but has asymptomatic feet. We recommend wearing supportive shoes to prevent the arch from stretching out excessively.  Dry Skin on Feet   Dry skin and calluses were noted on her feet during examination. We advise regular moisturizing and the use of foot cream for calluses.  Follow-up   Since  her A1c is within normal range and she is not currently being treated for diabetes, no immediate follow-up is required unless she experiences problems.          Return if symptoms worsen or fail to improve.

## 2022-11-21 NOTE — Patient Instructions (Signed)
VISIT SUMMARY:  You came in today for a routine foot check. We discussed your history of gestational diabetes, your flat feet, and the dry skin on your feet. Your A1c levels are normal, and you are not currently on any diabetes medication. We also talked about your recent weight gain and frequent falls.  YOUR PLAN:  -HISTORY OF GESTATIONAL DIABETES: Gestational diabetes is a type of diabetes that can develop during pregnancy and usually resolves after giving birth. Your A1c level is 4.4, which is normal. No immediate action is required, but please continue to monitor your blood sugar levels as instructed by your primary care physician.  -FLAT FEET (PES PLANUS): Flat feet, or pes planus, is a condition where the arches of the feet are flattened, causing the entire sole to touch the ground. You mentioned frequent tripping, so we recommend wearing supportive shoes to help prevent your arches from stretching out excessively.  -DRY SKIN ON FEET: Dry skin and calluses were noted on your feet. We recommend regular moisturizing and using foot cream specifically designed for calluses to keep your skin healthy.  INSTRUCTIONS:  No immediate follow-up is required unless you experience any problems. Continue to monitor your blood sugar levels as instructed by your primary care physician.

## 2022-12-10 ENCOUNTER — Emergency Department (HOSPITAL_COMMUNITY)
Admission: EM | Admit: 2022-12-10 | Discharge: 2022-12-10 | Disposition: A | Discharge status: Home / Self Care | Payer: Medicaid Other | Attending: Emergency Medicine | Admitting: Emergency Medicine

## 2022-12-10 ENCOUNTER — Encounter (HOSPITAL_COMMUNITY): Payer: Self-pay | Admitting: Emergency Medicine

## 2022-12-10 ENCOUNTER — Emergency Department (HOSPITAL_COMMUNITY): Payer: Medicaid Other

## 2022-12-10 ENCOUNTER — Other Ambulatory Visit: Payer: Self-pay

## 2022-12-10 DIAGNOSIS — R1031 Right lower quadrant pain: Secondary | ICD-10-CM | POA: Diagnosis not present

## 2022-12-10 DIAGNOSIS — M25572 Pain in left ankle and joints of left foot: Secondary | ICD-10-CM | POA: Diagnosis not present

## 2022-12-10 DIAGNOSIS — R1030 Lower abdominal pain, unspecified: Secondary | ICD-10-CM | POA: Insufficient documentation

## 2022-12-10 DIAGNOSIS — K449 Diaphragmatic hernia without obstruction or gangrene: Secondary | ICD-10-CM | POA: Diagnosis not present

## 2022-12-10 DIAGNOSIS — R1084 Generalized abdominal pain: Secondary | ICD-10-CM | POA: Diagnosis present

## 2022-12-10 DIAGNOSIS — K429 Umbilical hernia without obstruction or gangrene: Secondary | ICD-10-CM | POA: Diagnosis not present

## 2022-12-10 DIAGNOSIS — R1032 Left lower quadrant pain: Secondary | ICD-10-CM | POA: Diagnosis not present

## 2022-12-10 DIAGNOSIS — R9431 Abnormal electrocardiogram [ECG] [EKG]: Secondary | ICD-10-CM | POA: Diagnosis not present

## 2022-12-10 DIAGNOSIS — M79672 Pain in left foot: Secondary | ICD-10-CM | POA: Insufficient documentation

## 2022-12-10 LAB — COMPREHENSIVE METABOLIC PANEL
ALT: 57 U/L — ABNORMAL HIGH (ref 0–44)
AST: 33 U/L (ref 15–41)
Albumin: 3.3 g/dL — ABNORMAL LOW (ref 3.5–5.0)
Alkaline Phosphatase: 69 U/L (ref 38–126)
Anion gap: 9 (ref 5–15)
BUN: 10 mg/dL (ref 6–20)
CO2: 24 mmol/L (ref 22–32)
Calcium: 8.8 mg/dL — ABNORMAL LOW (ref 8.9–10.3)
Chloride: 104 mmol/L (ref 98–111)
Creatinine, Ser: 0.76 mg/dL (ref 0.44–1.00)
GFR, Estimated: >60 mL/min (ref >60)
Glucose, Bld: 139 mg/dL — ABNORMAL HIGH (ref 70–99)
Potassium: 4.2 mmol/L (ref 3.5–5.1)
Sodium: 137 mmol/L (ref 135–145)
Total Bilirubin: 0.9 mg/dL (ref <1.2)
Total Protein: 7 g/dL (ref 6.5–8.1)

## 2022-12-10 LAB — CBC
HCT: 36.4 % (ref 36.0–46.0)
Hemoglobin: 11.5 g/dL — ABNORMAL LOW (ref 12.0–15.0)
MCH: 31.2 pg (ref 26.0–34.0)
MCHC: 31.6 g/dL (ref 30.0–36.0)
MCV: 98.6 fL (ref 80.0–100.0)
Platelets: 243 10*3/uL (ref 150–400)
RBC: 3.69 MIL/uL — ABNORMAL LOW (ref 3.87–5.11)
RDW: 12.6 % (ref 11.5–15.5)
WBC: 6.1 10*3/uL (ref 4.0–10.5)
nRBC: 0 % (ref 0.0–0.2)

## 2022-12-10 LAB — LIPASE, BLOOD: Lipase: 29 U/L (ref 11–51)

## 2022-12-10 LAB — WET PREP, GENITAL
Clue Cells Wet Prep HPF POC: NONE SEEN
Sperm: NONE SEEN
Trich, Wet Prep: NONE SEEN
WBC, Wet Prep HPF POC: <10 (ref <10)
Yeast Wet Prep HPF POC: NONE SEEN

## 2022-12-10 LAB — HCG, SERUM, QUALITATIVE: Preg, Serum: NEGATIVE

## 2022-12-10 MED ORDER — IOPAMIDOL (ISOVUE-370) INJECTION 76%
75.0000 mL | Freq: Once | INTRAVENOUS | Status: AC | PRN
  Administered 2022-12-10: 75 mL via INTRAVENOUS

## 2022-12-10 NOTE — Discharge Instructions (Signed)
You have been seen today for your complaint of lower abdominal pain. Your lab work was reassuring. Your imaging was reassuring. Your discharge medications include Alternate tylenol and ibuprofen for pain. You may alternate these every 4 hours. You may take up to 800 mg of ibuprofen at a time and up to 1000 mg of tylenol. Follow up with: Gynecology for reevaluation as scheduled Please seek immediate medical care if you develop any of the following symptoms: You cannot stop vomiting. Your pain is only in one part of the abdomen. Pain on the right side could be caused by appendicitis. You have bloody or black poop (stool), or poop that looks like tar. You have trouble breathing. You have chest pain. At this time there does not appear to be the presence of an emergent medical condition, however there is always the potential for conditions to change. Please read and follow the below instructions.  Do not take your medicine if  develop an itchy rash, swelling in your mouth or lips, or difficulty breathing; call 911 and seek immediate emergency medical attention if this occurs.  You may review your lab tests and imaging results in their entirety on your MyChart account.  Please discuss all results of fully with your primary care provider and other specialist at your follow-up visit.  Note: Portions of this text may have been transcribed using voice recognition software. Every effort was made to ensure accuracy; however, inadvertent computerized transcription errors may still be present.

## 2022-12-10 NOTE — ED Provider Notes (Signed)
[place] EMERGENCY DEPARTMENT AT [facility] Provider Note   CSN: [id number] Arrival date & time: 12/10/22  4098     History  Chief Complaint  Patient presents with   Abdominal Pain   Dizziness    Morgan Mathews is a 32 y.o. female.  With a history of gestational diabetes presenting to the ED for evaluation of generalized abdominal pain.  She has had intermittent sharp lower abdominal pains for the past 2 weeks.  Pain typically lasts 1 minute at a time and then improves, however she has persistent mild lower abdominal aching after episodes of sharp pain.  States the pain was intense enough at 1:00 this morning that she decided to come to the emergency department.  She states her menstrual period is 5 days late, however she has irregular menstrual periods.  She denies any vaginal bleeding, discharge, odor, pain.  No dysuria, frequency or urgency.  No dyschezia.  No fevers or chills.  No chest pain or shortness of breath.  No flank pain.  She did have an episode of dizziness when she had the pain, however this resolved when the pain improved.  She is also complaining of left dorsal foot pain.  This been present for 3 or 4 days.  She denies any trauma.  Pain is worse with ambulation.  No numbness, weakness or tingling.  Her abdominal pain is minimal at this time.  She no longer has any lightheadedness or dizziness.   Abdominal Pain Dizziness      Home Medications Prior to Admission medications   Medication Sig Start Date End Date Taking? Authorizing Provider  OVER THE COUNTER MEDICATION Take 1 tablet by mouth daily. RevitaPlus Womens Vitamin   Yes [provider]  phentermine 30 MG capsule Take 1 capsule (30 mg total) by mouth every morning. Patient not taking: Reported on 12/10/2022 11/14/22   Rema Fendt, NP      Allergies    Patient has no known allergies.    Review of Systems   Review of Systems  Gastrointestinal:  Positive for abdominal pain.   Neurological:  Positive for dizziness.    Physical Exam Updated Vital Signs BP 121/66   Pulse (!) 42   Temp 98.3 F (36.8 C) (Oral)   Resp 18   Wt 131.6 kg   LMP 11/03/2022 (Exact Date)   SpO2 100%   BMI 47.18 kg/m  Physical Exam Vitals and nursing note reviewed.  Constitutional:      General: She is not in acute distress.    Appearance: She is well-developed.  HENT:     Head: Normocephalic and atraumatic.  Eyes:     Conjunctiva/sclera: Conjunctivae normal.  Cardiovascular:     Rate and Rhythm: Normal rate and regular rhythm.     Heart sounds: No murmur heard. Pulmonary:     Effort: Pulmonary effort is normal. No respiratory distress.     Breath sounds: Normal breath sounds.  Abdominal:     General: Abdomen is protuberant.     Palpations: Abdomen is soft.     Tenderness: There is no abdominal tenderness. There is no guarding.  Musculoskeletal:        General: No swelling.     Cervical back: Neck supple.  Feet:     Comments: Mild TTP to dorsum of left midfoot.  No ankle TTP.  No deformities or swelling.  No overlying erythema.  No skin lesions.  Normal AROM.  Sensation intact in all digits.  Capillary refill  normal.  DP pulses 2+ bilaterally. Skin:    General: Skin is warm and dry.     Capillary Refill: Capillary refill takes less than 2 seconds.  Neurological:     General: No focal deficit present.     Mental Status: She is alert and oriented to person, place, and time.  Psychiatric:        Mood and Affect: Mood normal.     ED Results / Procedures / Treatments   Labs (all labs ordered are listed, but only abnormal results are displayed) Labs Reviewed  COMPREHENSIVE METABOLIC PANEL - Abnormal; Notable for the following components:      Result Value   Glucose, Bld 139 (*)    Calcium 8.8 (*)    Albumin 3.3 (*)    ALT 57 (*)    All other components within normal limits  CBC - Abnormal; Notable for the following components:   RBC 3.69 (*)    Hemoglobin 11.5  (*)    All other components within normal limits  WET PREP, GENITAL  LIPASE, BLOOD  HCG, SERUM, QUALITATIVE  URINALYSIS, ROUTINE W REFLEX MICROSCOPIC  GC/CHLAMYDIA PROBE AMP ([place]) NOT AT [facility]    EKG None  Radiology CT ABDOMEN PELVIS W CONTRAST  Result Date: 12/10/2022 CLINICAL DATA:  RLQ abdominal pain. EXAM: CT ABDOMEN AND PELVIS WITH CONTRAST TECHNIQUE: Multidetector CT imaging of the abdomen and pelvis was performed using the standard protocol following bolus administration of intravenous contrast. RADIATION DOSE REDUCTION: This exam was performed according to the departmental dose-optimization program which includes automated exposure control, adjustment of the mA and/or kV according to patient size and/or use of iterative reconstruction technique. CONTRAST:  75mL ISOVUE-370 IOPAMIDOL (ISOVUE-370) INJECTION 76% COMPARISON:  None Available. FINDINGS: Lower chest: The lung bases are clear. No pleural effusion. The heart is normal in size. No pericardial effusion. Hepatobiliary: The liver is normal in size. Non-cirrhotic configuration. No suspicious mass. No intrahepatic or extrahepatic bile duct dilation. No calcified gallstones. Normal gallbladder wall thickness. No pericholecystic inflammatory changes. Pancreas: Unremarkable. No pancreatic ductal dilatation or surrounding inflammatory changes. Spleen: Within normal limits. No focal lesion. Adrenals/Urinary Tract: Adrenal glands are unremarkable. No suspicious renal mass. No hydronephrosis. There is a punctate nonobstructing calculus in the right kidney lower pole calyx. No other renal or ureteric calculi. Unremarkable urinary bladder. Stomach/Bowel: There is a small sliding hiatal hernia. No disproportionate dilation of the small or large bowel loops. No evidence of abnormal bowel wall thickening or inflammatory changes. The appendix is unremarkable. Vascular/Lymphatic: No ascites or pneumoperitoneum. No abdominal or pelvic  lymphadenopathy, by size criteria. No aneurysmal dilation of the major abdominal arteries. Reproductive: Normal-size anteverted uterus. Unremarkable right ovary. There is a hypoattenuating 3.6 x 3.7 cm structure in the left adnexa favored to represent ovarian in etiology. This may represent simple ovarian cyst however, not well characterized on the CT scan exam. Correlate clinically to determine the need for better characterization with pelvic ultrasound. Other: There is a tiny periumbilical fat containing hernia. The soft tissues and abdominal wall are otherwise unremarkable. Musculoskeletal: No suspicious osseous lesions. IMPRESSION: *No acute inflammatory process identified within the abdomen or pelvis. Unremarkable appendix. *Multiple other nonacute observations, as described above. Electronically Signed   By: Jules Schick M.D.   On: 12/10/2022 13:59   DG Foot Complete Left  Result Date: 12/10/2022 CLINICAL DATA:  dorsal midfoot pain x3 days, no trauma EXAM: LEFT FOOT - COMPLETE 3+ VIEW COMPARISON:  None Available. FINDINGS: No acute fracture or  dislocation. No aggressive osseous lesion. Ankle mortise appears intact. No significant arthritis. No focal soft tissue swelling. No radiopaque foreign bodies. IMPRESSION: *No acute osseous abnormality of the left foot. Electronically Signed   By: Jules Schick M.D.   On: 12/10/2022 13:26    Procedures Procedures    Medications Ordered in ED Medications  iopamidol (ISOVUE-370) 76 % injection 75 mL (75 mLs Intravenous Contrast Given 12/10/22 1232)    ED Course/ Medical Decision Making/ A&P                                 Medical Decision Making Amount and/or Complexity of Data Reviewed Labs: ordered. Radiology: ordered.  Risk Prescription drug management.  This patient presents to the ED for concern of lower abdominal pain, this involves an extensive number of treatment options, and is a complaint that carries with it a high risk of  complications and morbidity. Differential diagnosis of her lower abdominal considerations include pelvic inflammatory disease, ectopic pregnancy, appendicitis, urinary calculi, primary dysmenorrhea, septic abortion, ruptured ovarian cyst or tumor, ovarian torsion, tubo-ovarian abscess, degeneration of fibroid, endometriosis, diverticulitis, cystitis.   My initial workup includes labs, imaging.  Patient declined pain medication  Additional history obtained from: Nursing notes from this visit.  I ordered, reviewed and interpreted labs which include: CBC, CMP, lipase, urinalysis, hCG  I ordered imaging studies including x-ray left foot, CT abdomen pelvis I independently visualized and interpreted imaging which showed negative x-ray left foot, unremarkable CT abdomen pelvis.  Questionable left ovarian cyst I agree with the radiologist interpretation  Afebrile, hemodynamically stable.  32 year old female presenting for evaluation of lower abdominal pain.  Symptoms have been intermittent over the past 2 weeks.  Pain was severe this morning so she came to the emergency department for further evaluation.  She has no pain on my initial evaluation.  She has been asymptomatic throughout her stay in the emergency department.  Sharp pain lasts approximately 1 minute at a time and then resolves but does have some aching abdominal pain shortly afterwards.  This is in the bilateral lower abdomen.  Lab workup is reassuring.  No leukocytosis or significant anemia.  No electrolyte abnormality.  No tenderness to palpation of the abdomen.  CT abdomen pelvis was reassuring.  Questionable left ovarian cyst.  Given bilateral nature of pain and prolonged symptoms, lower suspicion for ovarian torsion or abscess.  On my interpretation of CT abdomen pelvis, there are some calcifications in the uterus.  Question uterine fibroids as an etiology of her symptoms.  Patient is not sexually active and has not had intercourse for the past  3 months.  She denies any vaginal discharge.  No history of PID.  I discussed with the patient that a pelvic exam is needed to definitively rule out PID.  She declines this stating she has an appointment with her gynecologist soon and would rather follow-up with them.  I discussed the potential complications of not diagnosing PID and initiating treatment.  Overall I have lower suspicion for PID given lack of lower abdominal TTP.  Patient was encouraged to treat her symptoms with Tylenol and ibuprofen.  She was given return precautions.  Stable at discharge.  At this time there does not appear to be any evidence of an acute emergency medical condition and the patient appears stable for discharge with appropriate outpatient follow up. Diagnosis was discussed with patient who verbalizes understanding of care plan and  is agreeable to discharge. I have discussed return precautions with patient who verbalizes understanding. Patient encouraged to follow-up with their PCP within 1 week. All questions answered.  Patient's case discussed with Dr. Jeraldine Loots who agrees with plan to discharge with follow-up.   Note: Portions of this report may have been transcribed using voice recognition software. Every effort was made to ensure accuracy; however, inadvertent computerized transcription errors may still be present.        Final Clinical Impression(s) / ED Diagnoses Final diagnoses:  Lower abdominal pain  Left foot pain    Rx / DC Orders ED Discharge Orders     None         Michelle Piper, Cordelia Poche 12/10/22 1433    Gerhard Munch, MD 12/13/22 [phone]

## 2022-12-10 NOTE — ED Triage Notes (Signed)
Pt in with generalized sharp abdominal pain x 2 wks. Also states she has had dizziness and frontal HA since waking at 0100. Also reports pain to L shin down to foot. Denies any cp, n/v/d or sob. Reports she is late for her period

## 2022-12-11 LAB — GC/CHLAMYDIA PROBE AMP (~~LOC~~) NOT AT ARMC
Chlamydia: NEGATIVE
Comment: NEGATIVE
Comment: NORMAL
Neisseria Gonorrhea: NEGATIVE

## 2022-12-19 ENCOUNTER — Encounter (INDEPENDENT_AMBULATORY_CARE_PROVIDER_SITE_OTHER): Payer: Self-pay | Admitting: Physician Assistant

## 2022-12-19 ENCOUNTER — Ambulatory Visit (INDEPENDENT_AMBULATORY_CARE_PROVIDER_SITE_OTHER): Payer: Medicaid Other | Admitting: Physician Assistant

## 2022-12-19 VITALS — BP 115/75 | HR 88 | Temp 98.8°F | Ht 65.0 in | Wt 282.0 lb

## 2022-12-19 DIAGNOSIS — E785 Hyperlipidemia, unspecified: Secondary | ICD-10-CM | POA: Diagnosis not present

## 2022-12-19 DIAGNOSIS — Z8632 Personal history of gestational diabetes: Secondary | ICD-10-CM | POA: Diagnosis not present

## 2022-12-19 DIAGNOSIS — Z6841 Body Mass Index (BMI) 40.0 and over, adult: Secondary | ICD-10-CM | POA: Diagnosis not present

## 2022-12-19 DIAGNOSIS — E66813 Obesity, class 3: Secondary | ICD-10-CM

## 2022-12-19 NOTE — Progress Notes (Signed)
Office: [phone]  /  Fax: [phone]   Initial Visit  Morgan Mathews was seen in clinic today to evaluate for obesity. She is interested in losing weight to improve overall health and reduce the risk of weight related complications. She presents today to review program treatment options, initial physical assessment, and evaluation.     She was referred by: PCP  When asked what else they would like to accomplish? She states: Adopt healthier eating patterns, Improve energy levels and physical activity, Improve existing medical conditions, Improve quality of life, Improve appearance, Improve self-confidence, and Lose a target amount of weight : 85 lbs lbs in 12 months.  Weight history: Gained weight when came to [place] 8 years ago. Has 4 children. Was pregnant when she came to [place], but has regularly put on weight.       When asked how has your weight affected you? She states: Contributed to medical problems, Contributed to orthopedic problems or mobility issues, Having fatigue, Having poor endurance, and Problems with eating patterns  Some associated conditions: Hyperlipidemia, PCOS, and Other: Gestational diabetes  Contributing factors: Disruption of circadian rhythm / sleep disordered breathing, Moderate to high levels of stress, Reduced physical activity, Eating patterns, Strong orexigenic signaling and inadequate inhibitory control , and Slow metabolism for age  Weight promoting medications identified: None  Current nutrition plan: None  Current level of physical activity: Walking and NEAT  Current or previous pharmacotherapy: Phentermine per PCP  Response to medication:  just started Phentermine and   has lost some weight thus far   Past medical history includes:   Past Medical History:  Diagnosis Date   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  Peak Weight:290lb , Body Fat%:51.3%, Visceral Fat Rating:15, Weight trend over the last 12 months: Increasing  General:  Alert, oriented and cooperative. Patient is in no acute distress.  Respiratory: Normal respiratory effort, no problems with respiration noted   Gait: able to ambulate independently  Mental Status: Normal mood and affect. Normal behavior. Normal judgment and thought content.   DIAGNOSTIC DATA REVIEWED:  BMET    Component Value Date/Time   NA 137 12/10/2022 0348   NA 138 11/14/2022 1038   K 4.2 12/10/2022 0348   CL 104 12/10/2022 0348   CO2 24 12/10/2022 0348   GLUCOSE 139 (H) 12/10/2022 0348   GLUCOSE 68 12/07/2015 1020   BUN 10 12/10/2022 0348   BUN 10 11/14/2022 1038   CREATININE 0.76 12/10/2022 0348   CALCIUM 8.8 (L) 12/10/2022 0348   GFRNONAA >60 12/10/2022 0348   GFRAA >60 04/07/2017 1634   Lab Results  Component Value Date   HGBA1C 5.1 11/14/2022   HGBA1C 4.0 09/05/2015   No results found for: "INSULIN" CBC    Component Value Date/Time   WBC 6.1 12/10/2022 0348   RBC 3.69 (L) 12/10/2022 0348   HGB 11.5 (L) 12/10/2022 0348   HGB 11.7 11/14/2022 1038   HCT 36.4 12/10/2022 0348   HCT 36.2 11/14/2022 1038   PLT 243 12/10/2022 0348   PLT 301 11/14/2022 1038   MCV 98.6 12/10/2022  0348   MCV 97 11/14/2022 1038   MCH 31.2 12/10/2022 0348   MCHC 31.6 12/10/2022 0348   RDW 12.6 12/10/2022 0348   RDW 11.9 11/14/2022 1038   Iron/TIBC/Ferritin/ %Sat No results found for: "IRON", "TIBC", "FERRITIN", "IRONPCTSAT" Lipid Panel     Component Value Date/Time   CHOL 212 (H) 11/14/2022 1038   TRIG 70 11/14/2022 1038   HDL 79 11/14/2022 1038   CHOLHDL 2.7 11/14/2022 1038   LDLCALC 121 (H) 11/14/2022 1038   Hepatic Function Panel     Component Value Date/Time   PROT 7.0 12/10/2022 0348    PROT 7.8 11/14/2022 1038   ALBUMIN 3.3 (L) 12/10/2022 0348   ALBUMIN 4.6 11/14/2022 1038   AST 33 12/10/2022 0348   ALT 57 (H) 12/10/2022 0348   ALKPHOS 69 12/10/2022 0348   BILITOT 0.9 12/10/2022 0348   BILITOT 0.6 11/14/2022 1038   BILIDIR 0.1 02/12/2022 0207   IBILI 0.6 02/12/2022 0207      Component Value Date/Time   TSH 1.980 11/14/2022 1038     Assessment and Plan:   Hyperlipidemia LDL goal <100  History of gestational diabetes  Class 3 severe obesity due to excess calories with serious comorbidity and body mass index (BMI) of 45.0 to 49.9 in adult [facility])        Obesity Treatment / Action Plan:  Patient will work on garnering support from family and friends to begin weight loss journey. Will work on eliminating or reducing the presence of highly palatable, calorie dense foods in the home. Will complete provided nutritional and psychosocial assessment questionnaire before the next appointment. Will be scheduled for indirect calorimetry to determine resting energy expenditure in a fasting state.  This will allow [place] to create a reduced calorie, high-protein meal plan to promote loss of fat mass while preserving muscle mass. Will think about ideas on how to incorporate physical activity into their daily routine. Will work on reducing intake of added sugars, simple sugars and processed carbs. Will avoid skipping meals which may result in increased hunger signals and overeating at certain times. Counseled on the health benefits of losing 5%-15% of total body weight. Was counseled on nutritional approaches to weight loss and benefits of reducing processed foods and consuming plant-based foods and high quality protein as part of nutritional weight management. Was counseled on pharmacotherapy and role as an adjunct in weight management.   Obesity Education Performed Today:  She was weighed on the bioimpedance scale and results were discussed and documented in the  synopsis.  We discussed obesity as a disease and the importance of a more detailed evaluation of all the factors contributing to the disease.  We discussed the importance of long term lifestyle changes which include nutrition, exercise and behavioral modifications as well as the importance of customizing this to her specific health and social needs.  We discussed the benefits of reaching a healthier weight to alleviate the symptoms of existing conditions and reduce the risks of the biomechanical, metabolic and psychological effects of obesity.  Avital Frink appears to be in the action stage of change and states they are ready to start intensive lifestyle modifications and behavioral modifications.  30 minutes was spent today on this visit including the above counseling, pre-visit chart review, and post-visit documentation.  Reviewed by clinician on day of visit: allergies, medications, problem list, medical history, surgical history, family history, social history, and previous encounter notes pertinent to obesity diagnosis.  Roel Douthat,PA-C

## 2022-12-24 ENCOUNTER — Ambulatory Visit (INDEPENDENT_AMBULATORY_CARE_PROVIDER_SITE_OTHER): Payer: Medicaid Other | Admitting: Family Medicine

## 2022-12-24 ENCOUNTER — Encounter: Payer: Self-pay | Admitting: Family Medicine

## 2022-12-24 VITALS — BP 103/67 | HR 77 | Temp 98.4°F | Resp 18 | Ht 65.0 in | Wt 283.0 lb

## 2022-12-24 DIAGNOSIS — E66813 Obesity, class 3: Secondary | ICD-10-CM | POA: Diagnosis not present

## 2022-12-24 DIAGNOSIS — M79672 Pain in left foot: Secondary | ICD-10-CM | POA: Diagnosis not present

## 2022-12-24 DIAGNOSIS — Z6841 Body Mass Index (BMI) 40.0 and over, adult: Secondary | ICD-10-CM

## 2022-12-24 DIAGNOSIS — E6609 Other obesity due to excess calories: Secondary | ICD-10-CM

## 2022-12-24 DIAGNOSIS — R1030 Lower abdominal pain, unspecified: Secondary | ICD-10-CM

## 2022-12-30 ENCOUNTER — Encounter: Payer: Self-pay | Admitting: Family Medicine

## 2022-12-30 NOTE — Progress Notes (Signed)
Established Patient Office Visit  Subjective    Patient ID: Morgan Mathews, female    DOB: 16-Jun-1990  Age: 32 y.o. MRN: [id number]  CC:  Chief Complaint  Patient presents with   Hospitalization Follow-up    HPI Morgan Mathews presents for follow up of recent ED visit. Patient was seen for abdominal pain. Patient reports sx have resolved.   Outpatient Encounter Medications as of 12/24/2022  Medication Sig   OVER THE COUNTER MEDICATION Take 1 tablet by mouth daily. RevitaPlus Womens Vitamin   phentermine 30 MG capsule Take 1 capsule (30 mg total) by mouth every morning.   No facility-administered encounter medications on file as of 12/24/2022.    Past Medical History:  Diagnosis Date   Chronic bilateral thoracic back pain 08/28/2016   Gestational diabetes    Strabismus 1994   Right exotropia    Past Surgical History:  Procedure Laterality Date   NO PAST SURGERIES      Family History  Problem Relation Age of Onset   Diabetes Mother    Healthy Father     Social History   Socioeconomic History   Marital status: Married    Spouse name: Musah Iddrisu   Number of children: 2   Years of education: 8   Highest education level: 12th grade  Occupational History   Not on file  Tobacco Use   Smoking status: Never   Smokeless tobacco: Never  Vaping Use   Vaping status: Never Used  Substance and Sexual Activity   Alcohol use: No   Drug use: No   Sexual activity: Yes    Birth control/protection: None  Other Topics Concern   Not on file  Social History Narrative   Originally from [place]   Came to [employer]. In 2015.   Lives at home with husband and 2 children.   Social Determinants of Health   Financial Resource Strain: Low Risk  (12/24/2022)   Overall Financial Resource Strain (CARDIA)    Difficulty of Paying Living Expenses: Not hard at all  Food Insecurity: No Food Insecurity (04/27/2020)   Hunger Vital Sign    Worried About Running Out of Food in the Last  Year: Never true    Ran Out of Food in the Last Year: Never true  Transportation Needs: No Transportation Needs (04/27/2020)   PRAPARE - Administrator, Civil Service (Medical): No    Lack of Transportation (Non-Medical): No  Physical Activity: Insufficiently Active (12/24/2022)   Exercise Vital Sign    Days of Exercise per Week: 4 days    Minutes of Exercise per Session: 30 min  Stress: No Stress Concern Present (12/24/2022)   [employer] of Occupational Health - Occupational Stress Questionnaire    Feeling of Stress : Not at all  Social Connections: Socially Integrated (12/24/2022)   Social Connection and Isolation Panel [NHANES]    Frequency of Communication with Friends and Family: More than three times a week    Frequency of Social Gatherings with Friends and Family: Never    Attends Religious Services: More than 4 times per year    Active Member of [employer] or Organizations: Yes    Attends Banker Meetings: Never    Marital Status: Married  Catering manager Violence: Not At Risk (04/27/2020)   Humiliation, Afraid, Rape, and Kick questionnaire    Fear of Current or Ex-Partner: No    Emotionally Abused: No    Physically Abused: No    Sexually Abused: No  Review of Systems  All other systems reviewed and are negative.       Objective    BP 103/67 (BP Location: Right Arm, Patient Position: Sitting, Cuff Size: Large)   Pulse 77   Temp 98.4 F (36.9 C) (Oral)   Resp 18   Ht 5\' 5"  (1.651 m)   Wt 283 lb (128.4 kg)   SpO2 97%   BMI 47.09 kg/m   Physical Exam Vitals and nursing note reviewed.  Constitutional:      General: She is not in acute distress. Cardiovascular:     Rate and Rhythm: Normal rate and regular rhythm.  Pulmonary:     Effort: Pulmonary effort is normal.     Breath sounds: Normal breath sounds.  Abdominal:     Palpations: Abdomen is soft.     Tenderness: There is no abdominal tenderness.  Neurological:     General:  No focal deficit present.     Mental Status: She is alert and oriented to person, place, and time.         Assessment & Plan:   Lower abdominal pain  Left foot pain     Return if symptoms worsen or fail to improve.   Tommie Raymond, MD

## 2023-01-02 ENCOUNTER — Ambulatory Visit: Payer: Medicaid Other | Admitting: Certified Nurse Midwife

## 2023-01-02 ENCOUNTER — Other Ambulatory Visit (HOSPITAL_COMMUNITY)
Admission: RE | Admit: 2023-01-02 | Discharge: 2023-01-02 | Disposition: A | Discharge status: Home / Self Care | Payer: Medicaid Other | Source: Ambulatory Visit | Attending: Certified Nurse Midwife | Admitting: Certified Nurse Midwife

## 2023-01-02 ENCOUNTER — Other Ambulatory Visit: Payer: Self-pay

## 2023-01-02 ENCOUNTER — Encounter: Payer: Self-pay | Admitting: Certified Nurse Midwife

## 2023-01-02 VITALS — BP 106/72 | HR 67 | Wt 282.5 lb

## 2023-01-02 DIAGNOSIS — Z1331 Encounter for screening for depression: Secondary | ICD-10-CM | POA: Diagnosis not present

## 2023-01-02 DIAGNOSIS — Z3009 Encounter for other general counseling and advice on contraception: Secondary | ICD-10-CM | POA: Diagnosis not present

## 2023-01-02 DIAGNOSIS — Z01419 Encounter for gynecological examination (general) (routine) without abnormal findings: Secondary | ICD-10-CM | POA: Diagnosis not present

## 2023-01-02 DIAGNOSIS — N926 Irregular menstruation, unspecified: Secondary | ICD-10-CM | POA: Diagnosis not present

## 2023-01-02 DIAGNOSIS — N898 Other specified noninflammatory disorders of vagina: Secondary | ICD-10-CM

## 2023-01-02 DIAGNOSIS — Z113 Encounter for screening for infections with a predominantly sexual mode of transmission: Secondary | ICD-10-CM | POA: Insufficient documentation

## 2023-01-02 DIAGNOSIS — D219 Benign neoplasm of connective and other soft tissue, unspecified: Secondary | ICD-10-CM

## 2023-01-02 NOTE — Progress Notes (Addendum)
ANNUAL EXAM Patient name: Morgan Mathews MRN [id number]  Date of birth: 01-Jan-1991 Chief Complaint:   Gynecologic Exam  History of Present Illness:   Morgan Mathews is a 32 y.o. G80P4004 African-American female being seen today for a routine annual exam and pap. Patient reports she has not been sexually active recently. Reports irregular, intermittent HMB. She was seen in the ED last month for abdominal pain. CT of abd-pelvis was performed of which they could not ruleout fibroids d/t small calcifications noted in the uterus and her sx's at the time, a simple left cyst was noted, and small sliding hiatal hernia. Patient denies abdominal pain today.  Current complaints: abdominal pain now resolved, irregular menses & STD screening  Patient's last menstrual period was 12/25/2022 (exact date).   The pregnancy intention screening data noted above was reviewed. Potential methods of contraception were discussed. The patient elected to proceed with no BC at this time.  Last pap 07/2019. Results were: negative per pt report at the [facility] . H/O abnormal pap: no Last mammogram: N/A. Results were: N/A. Family h/o breast cancer: no Last colonoscopy: N/A. Results were: N/A. Family h/o colorectal cancer: no     12/24/2022    9:21 AM 11/14/2022   10:08 AM 08/30/2022    9:26 AM 09/06/2020    5:16 PM 08/23/2020    9:11 AM  Depression screen PHQ 2/9  Decreased Interest 0 0 0 0 0  Down, Depressed, Hopeless 0 0 0 0 0  PHQ - 2 Score 0 0 0 0 0  Altered sleeping   0 0 0  Tired, decreased energy   1 0 0  Change in appetite   2 0 0  Feeling bad or failure about yourself    0 0 0  Trouble concentrating   0 0 0  Moving slowly or fidgety/restless   0 0 0  Suicidal thoughts   0 0 0  PHQ-9 Score   3 0 0        12/24/2022    9:22 AM 08/30/2022    9:26 AM 09/06/2020    5:16 PM 08/23/2020    9:11 AM  GAD 7 : Generalized Anxiety Score  Nervous, Anxious, on Edge 0 0 0 0  Control/stop worrying 0  0 0 0  Worry too much - different things 0 0 0 0  Trouble relaxing 0 0 0 0  Restless 0 0 0 0  Easily annoyed or irritable 0 0 0 0  Afraid - awful might happen 0 0 0 0  Total GAD 7 Score 0 0 0 0  Anxiety Difficulty Not difficult at all        Review of Systems:   Pertinent items are noted in HPI Denies any headaches, blurred vision, fatigue, shortness of breath, chest pain, abdominal pain, abnormal vaginal discharge/itching/odor/irritation, problems with periods, bowel movements, urination, or intercourse unless otherwise stated above. Pertinent History Reviewed:  Reviewed past medical,surgical, social and family history.  Reviewed problem list, medications and allergies. Physical Assessment:   Vitals:   01/02/23 0838  BP: 106/72  Pulse: 67  Weight: 128.1 kg  Body mass index is 47.01 kg/m.        Physical Examination:   General appearance - well appearing, and in no distress  Mental status - alert, oriented to person, place, and time  Psych:  She has a normal mood and affect  Skin - warm and dry, normal color, no suspicious lesions noted  Chest -  effort normal, all lung fields clear to auscultation bilaterally  Heart - normal rate and regular rhythm  Neck:  midline trachea, no thyromegaly or nodules  Breasts - breasts appear normal, no suspicious masses, no skin or nipple changes or  axillary nodes  Abdomen - soft, nontender, nondistended, no masses or organomegaly  Pelvic - VULVA: normal appearing vulva with no masses, tenderness or lesions  VAGINA: normal appearing vagina with normal color and discharge, no lesions  CERVIX: normal appearing cervix without discharge or lesions, no CMT  Thin prep pap is done with HR HPV cotesting  UTERUS: uterus is felt to be normal size, shape, consistency and nontender   ADNEXA: No adnexal masses or tenderness noted.  Rectal - normal rectal, good sphincter tone, no masses felt. Hemoccult: n/a  Extremities:  No swelling or varicosities  noted  Chaperone present for exam  No results found for this or any previous visit (from the past 24 hour(s)).  Assessment & Plan:  1) Well-Woman Exam -STD screening -Pap today -With consent, discussed weight loss and healthy lifestyle goals. Recommendations given. Patient receptive to education. -Declined BC today for menstrual regulation, dysmenorrhea, or HMB sx's -Encouraged barrier protection to prevent STIs/STDs   Labs/procedures today: Pap, HIV, RPR  Mammogram: @ 32yo, or sooner if problems  Follow-up: with PCP for annual exam or PRN. Gyn follow-up in 1 year or PRN.   Darrell Jewel, Student-MidWife 01/02/2023 8:44 AM

## 2023-01-03 LAB — HIV ANTIBODY (ROUTINE TESTING W REFLEX): HIV Screen 4th Generation wRfx: NONREACTIVE

## 2023-01-03 LAB — CERVICOVAGINAL ANCILLARY ONLY
Bacterial Vaginitis (gardnerella): NEGATIVE
Candida Glabrata: NEGATIVE
Candida Vaginitis: NEGATIVE
Comment: NEGATIVE
Comment: NEGATIVE
Comment: NEGATIVE

## 2023-01-03 LAB — RPR: RPR Ser Ql: NONREACTIVE

## 2023-01-06 LAB — CYTOLOGY - PAP
Adequacy: ABSENT
Chlamydia: NEGATIVE
Comment: NEGATIVE
Comment: NEGATIVE
Comment: NEGATIVE
Comment: NORMAL
Diagnosis: NEGATIVE
High risk HPV: NEGATIVE
Neisseria Gonorrhea: NEGATIVE
Trichomonas: NEGATIVE

## 2023-01-28 DIAGNOSIS — Z0289 Encounter for other administrative examinations: Secondary | ICD-10-CM

## 2023-02-19 ENCOUNTER — Ambulatory Visit (INDEPENDENT_AMBULATORY_CARE_PROVIDER_SITE_OTHER): Payer: Medicaid Other | Admitting: Family Medicine

## 2023-02-27 ENCOUNTER — Encounter (INDEPENDENT_AMBULATORY_CARE_PROVIDER_SITE_OTHER): Payer: Self-pay | Admitting: Family Medicine

## 2023-02-27 ENCOUNTER — Ambulatory Visit (INDEPENDENT_AMBULATORY_CARE_PROVIDER_SITE_OTHER): Payer: Medicaid Other | Admitting: Family Medicine

## 2023-02-27 VITALS — BP 113/69 | HR 69 | Temp 98.1°F | Ht 66.0 in | Wt 278.0 lb

## 2023-02-27 DIAGNOSIS — Z1331 Encounter for screening for depression: Secondary | ICD-10-CM | POA: Diagnosis not present

## 2023-02-27 DIAGNOSIS — R0602 Shortness of breath: Secondary | ICD-10-CM | POA: Diagnosis not present

## 2023-02-27 DIAGNOSIS — E559 Vitamin D deficiency, unspecified: Secondary | ICD-10-CM

## 2023-02-27 DIAGNOSIS — O24419 Gestational diabetes mellitus in pregnancy, unspecified control: Secondary | ICD-10-CM | POA: Diagnosis not present

## 2023-02-27 DIAGNOSIS — D649 Anemia, unspecified: Secondary | ICD-10-CM

## 2023-02-27 DIAGNOSIS — R5383 Other fatigue: Secondary | ICD-10-CM | POA: Diagnosis not present

## 2023-02-27 DIAGNOSIS — E785 Hyperlipidemia, unspecified: Secondary | ICD-10-CM

## 2023-02-27 DIAGNOSIS — Z6841 Body Mass Index (BMI) 40.0 and over, adult: Secondary | ICD-10-CM | POA: Diagnosis not present

## 2023-02-27 DIAGNOSIS — E66813 Obesity, class 3: Secondary | ICD-10-CM

## 2023-02-27 NOTE — Assessment & Plan Note (Signed)
LDL 121 on last FLP in October 2024.

## 2023-02-27 NOTE — Progress Notes (Signed)
Chief Complaint:  Obesity   Subjective:  Morgan Mathews (MR# [id number]) is a 33 y.o. female who presents for evaluation and treatment of obesity and related comorbidities.   Morgan Mathews is currently in the action stage of change and ready to dedicate time achieving and maintaining a healthier weight. Morgan Mathews is interested in becoming our patient and working on intensive lifestyle modifications including (but not limited to) diet and exercise for weight loss.  Morgan Mathews has been struggling with her weight. She has been unsuccessful in either losing weight, maintaining weight loss, or reaching her healthy weight goal.  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  She lives at home with her 4 kids. One of her sons is autistic and a picky eater but other 3 kids are variety eaters.    Patient is from [place] and she cooks quite a bit of traditional foods. Food dislikes are pork, cream cheese and mayonnaise. She skips meals and then combines meals.   Food Recall: may eat 3 fried eggs before going to sleep with coffee and coke.  May eat pasta (2 cups) with egg noodles and chicken.  Eats banku and rice. Will often eat 2 cups of rice and will have 1-2 chicken thighs with the rice. Will bring food to work like rice, pasta, banku or bischoff and coke or monster.  Will eat all the food she brings and won't have seconds.     Indirect Calorimeter completed today shows a No data recorded . Her calculated basal metabolic rate is 4098 thus her basal metabolic rate is worse than expected.  Other Fatigue Morgan Mathews admits to daytime somnolence and admits to waking up still tired. Patient has a history of symptoms of daytime fatigue. Morgan Mathews generally gets 5 hours of sleep per night, and states that she has frequent awakenings. Snoring is present. Apneic episodes are not present. Epworth Sleepiness Score is 4.   Shortness of Breath Morgan Mathews notes increasing shortness of breath with  exercising and seems to be worsening over time with weight gain. She notes getting out of breath sooner with activity than she used to. This has gotten worse recently. Morgan Mathews denies shortness of breath at rest or orthopnea.  Depression Screen Morgan Mathews's Food and Mood (modified PHQ-9) score was 5.     01/02/2023    8:48 AM  Depression screen PHQ 2/9  Decreased Interest 0  Down, Depressed, Hopeless 0  PHQ - 2 Score 0  Altered sleeping 0  Tired, decreased energy 0  Change in appetite 0  Feeling bad or failure about yourself  0  Trouble concentrating 0  Moving slowly or fidgety/restless 0  Suicidal thoughts 0  PHQ-9 Score 0     Objective:  No data recorded  No data recorded  No data recorded  No data recorded   EKG: Normal sinus rhythm, rate 68.  General: Cooperative, alert, well developed, in no acute distress. HEENT: Conjunctivae and lids unremarkable. Cardiovascular: Regular rhythm.  Lungs: Normal work of breathing. Neurologic: No focal deficits.   Lab Results  Component Value Date   CREATININE 0.74 02/27/2023   BUN 12 02/27/2023   NA 142 02/27/2023   K 4.2 02/27/2023   CL 104 02/27/2023   CO2 23 02/27/2023   Lab Results  Component Value Date   ALT 19 02/27/2023   AST 18 02/27/2023   ALKPHOS 87 02/27/2023   BILITOT 0.6 02/27/2023   Lab Results  Component Value Date   HGBA1C 4.8  02/27/2023   HGBA1C 5.1 11/14/2022   HGBA1C 4.4 (L) 04/27/2020   HGBA1C 4.3 (L) 04/21/2017   HGBA1C 4.0 09/05/2015   Lab Results  Component Value Date   INSULIN 13.8 02/27/2023   Lab Results  Component Value Date   TSH 1.980 11/14/2022   Lab Results  Component Value Date   CHOL 209 (H) 02/27/2023   HDL 68 02/27/2023   LDLCALC 126 (H) 02/27/2023   TRIG 86 02/27/2023   CHOLHDL 2.7 11/14/2022   Lab Results  Component Value Date   WBC 5.8 02/27/2023   HGB 12.5 02/27/2023   HCT 39.5 02/27/2023   MCV 98 (H) 02/27/2023   PLT 280 02/27/2023   Lab Results   Component Value Date   IRON 74 02/27/2023   TIBC 303 02/27/2023   FERRITIN 66 02/27/2023    Assessment and Plan:   Other Fatigue  Morgan Mathews does feel that her weight is causing her energy to be lower than it should be. Fatigue may be related to obesity, depression or many other causes. Labs will be ordered, and in the meanwhile, Morgan Mathews will focus on self care including making healthy food choices, increasing physical activity and focusing on stress reduction.  Shortness of Breath  Morgan Mathews does feel that she gets out of breath more easily that she used to when she exercises. 's shortness of breath appears to be obesity related and exercise induced. She has agreed to work on weight loss and gradually increase exercise to treat her exercise induced shortness of breath. Will continue to monitor closely.     Problem List Items Addressed This Visit       Endocrine   Gestational diabetes mellitus (GDM) affecting pregnancy, antepartum   Patient had gestational diabetes in all her pregnancies.  She was diet controlled.  A1c postpartum WNL.       Relevant Orders   Comprehensive metabolic panel (Completed)   Hemoglobin A1c (Completed)   Insulin, random (Completed)     Other   Hyperlipidemia LDL goal <100   LDL 121 on last FLP in October 2024.      Relevant Orders   Lipid Panel With LDL/HDL Ratio (Completed)   Morbid obesity (HCC)   Starting weight: 278 Peak weight: 290s BMR: 1742 Previous obesity management: Phentermine 30mg  for 1 month Body Fat %: 50% Starting Meal Plan: see food plan below Meal Plan needs: incorporation of ghanese cooking       Anemia   Patient was told she has a history of anemia but is unclear as to what kind.  Will draw a CBC as well as anemia panel today.      Relevant Orders   CBC w/Diff/Platelet (Completed)   Anemia panel (Completed)   Vitamin D deficiency   Historical diagnosis of vitamin D deficiency.  Will draw a vitamin D level today to  assess where patient's levels.  Plan to discuss labs at next appointment.      Relevant Orders   VITAMIN D 25 Hydroxy (Vit-D Deficiency, Fractures) (Completed)   Other Visit Diagnoses       Other fatigue    -  Primary   Relevant Orders   Thyroid Panel With TSH     SOBOE (shortness of breath on exertion)         Class 3 severe obesity with serious comorbidity and body mass index (BMI) of 40.0 to 44.9 in adult, unspecified obesity type (HCC)           Morgan Mathews is  currently in the action stage of change and her goal is to continue with weight loss efforts. I recommend Morgan Mathews begin the structured treatment plan as follows:  She has agreed to  makeshift meal plan was created for patient today.  Breakfast will include 3 eggs and 1 cup of vegetables.  Patient was instructed she may be able to use 1 tablespoon of cooking oil.  Lunch will be 3 chicken thighs, 1 cup of vegetables.  Dinner to include 5 chicken thighs, 2 cups of vegetables.  Over the course of the day patient was instructed she can have a total of 2 cups of rice or half of the container of the beef teriyaki noodles that upon review of nutritional data showed 1 container equal to 500 cal with only 10 g of protein.  She was also told she may have 1 small 8 ounce can of Coke a day.  She is going to look to see if there is a nutrition label on some of the ethnic foods that she is not always making at home.  Ultimately this should lead to a total calorie intake around 1400 cal and 90 or more grams of protein.  Exercise goals: No exercise has been prescribed at this time.  Behavioral modification strategies:increasing lean protein intake, decreasing simple carbohydrates, decrease liquid calories, no skipping meals, and meal planning and cooking strategies  She was informed of the importance of frequent follow-up visits to maximize her success with intensive lifestyle modifications for her multiple health conditions. She was informed we  would discuss her lab results at her next visit unless there is a critical issue that needs to be addressed sooner. Morgan Mathews agreed to keep her next visit at the agreed upon time to discuss these results.   Attestation Statements:  Reviewed by clinician on day of visit: allergies, medications, problem list, medical history, surgical history, family history, social history, and previous encounter notes.  This is the patient's first visit at [facility]. The patient's NEW PATIENT PACKET was reviewed at length. Included in the packet: current and past health history, medications, allergies, ROS, gynecologic history (women only), surgical history, family history, social history, weight history, weight loss surgery history (for those that have had weight loss surgery), nutritional evaluation, mood and food questionnaire, PHQ9, Epworth questionnaire, sleep habits questionnaire, patient life and health improvement goals questionnaire. These will all be scanned into the patient's chart under media.   During the visit, I independently reviewed the patient's EKG, bioimpedance scale results, and indirect calorimeter results. I used this information to tailor a meal plan for the patient that will help her to lose weight and will improve her obesity-related conditions going forward. I performed a medically necessary appropriate examination and/or evaluation. I discussed the assessment and treatment plan with the patient. The patient was provided an opportunity to ask questions and all were answered. The patient agreed with the plan and demonstrated an understanding of the instructions. Labs were ordered at this visit and will be reviewed at the next visit unless more critical results need to be addressed immediately. Clinical information was updated and documented in the EMR.   Time spent on visit including pre-visit chart review and post-visit care was 60 minutes.     Reuben Likes, MD

## 2023-02-27 NOTE — Assessment & Plan Note (Addendum)
Starting weight: 278 Peak weight: 290s BMR: 1742 Previous obesity management: Phentermine 30mg  for 1 month Body Fat %: 50% Starting Meal Plan: see food plan below Meal Plan needs: incorporation of g[place]ese cooking

## 2023-02-27 NOTE — Assessment & Plan Note (Signed)
Patient had gestational diabetes in all her pregnancies.  She was diet controlled.  A1c postpartum WNL.

## 2023-03-01 LAB — VITAMIN D 25 HYDROXY (VIT D DEFICIENCY, FRACTURES): Vit D, 25-Hydroxy: 10.4 ng/mL — ABNORMAL LOW (ref 30.0–100.0)

## 2023-03-01 LAB — CBC WITH DIFFERENTIAL/PLATELET
Basophils Absolute: 0 10*3/uL (ref 0.0–0.2)
Basos: 1 %
EOS (ABSOLUTE): 0 10*3/uL (ref 0.0–0.4)
Eos: 1 %
Hemoglobin: 12.5 g/dL (ref 11.1–15.9)
Immature Grans (Abs): 0 10*3/uL (ref 0.0–0.1)
Immature Granulocytes: 0 %
Lymphocytes Absolute: 2.8 10*3/uL (ref 0.7–3.1)
Lymphs: 47 %
MCH: 31.1 pg (ref 26.6–33.0)
MCHC: 31.6 g/dL (ref 31.5–35.7)
MCV: 98 fL — ABNORMAL HIGH (ref 79–97)
Monocytes Absolute: 0.3 10*3/uL (ref 0.1–0.9)
Monocytes: 5 %
Neutrophils Absolute: 2.7 10*3/uL (ref 1.4–7.0)
Neutrophils: 46 %
Platelets: 280 10*3/uL (ref 150–450)
RBC: 4.02 x10E6/uL (ref 3.77–5.28)
RDW: 11.6 % — ABNORMAL LOW (ref 11.7–15.4)
WBC: 5.8 10*3/uL (ref 3.4–10.8)

## 2023-03-01 LAB — LIPID PANEL WITH LDL/HDL RATIO
Cholesterol, Total: 209 mg/dL — ABNORMAL HIGH (ref 100–199)
HDL: 68 mg/dL (ref >39)
LDL Chol Calc (NIH): 126 mg/dL — ABNORMAL HIGH (ref 0–99)
LDL/HDL Ratio: 1.9 {ratio} (ref 0.0–3.2)
Triglycerides: 86 mg/dL (ref 0–149)
VLDL Cholesterol Cal: 15 mg/dL (ref 5–40)

## 2023-03-01 LAB — ANEMIA PANEL
Ferritin: 66 ng/mL (ref 15–150)
Folate, Hemolysate: 315 ng/mL
Folate, RBC: 797 ng/mL (ref >498)
Hematocrit: 39.5 % (ref 34.0–46.6)
Iron Saturation: 24 % (ref 15–55)
Iron: 74 ug/dL (ref 27–159)
Retic Ct Pct: 3 % — ABNORMAL HIGH (ref 0.6–2.6)
Total Iron Binding Capacity: 303 ug/dL (ref 250–450)
UIBC: 229 ug/dL (ref 131–425)
Vitamin B-12: 686 pg/mL (ref 232–1245)

## 2023-03-01 LAB — INSULIN, RANDOM: INSULIN: 13.8 u[IU]/mL (ref 2.6–24.9)

## 2023-03-01 LAB — COMPREHENSIVE METABOLIC PANEL
ALT: 19 [IU]/L (ref 0–32)
AST: 18 [IU]/L (ref 0–40)
Albumin: 4.4 g/dL (ref 3.9–4.9)
Alkaline Phosphatase: 87 [IU]/L (ref 44–121)
BUN/Creatinine Ratio: 16 (ref 9–23)
BUN: 12 mg/dL (ref 6–20)
Bilirubin Total: 0.6 mg/dL (ref 0.0–1.2)
CO2: 23 mmol/L (ref 20–29)
Calcium: 9.6 mg/dL (ref 8.7–10.2)
Chloride: 104 mmol/L (ref 96–106)
Creatinine, Ser: 0.74 mg/dL (ref 0.57–1.00)
Globulin, Total: 3.4 g/dL (ref 1.5–4.5)
Glucose: 81 mg/dL (ref 70–99)
Potassium: 4.2 mmol/L (ref 3.5–5.2)
Sodium: 142 mmol/L (ref 134–144)
Total Protein: 7.8 g/dL (ref 6.0–8.5)
eGFR: 111 mL/min/{1.73_m2} (ref >59)

## 2023-03-01 LAB — HEMOGLOBIN A1C
Est. average glucose Bld gHb Est-mCnc: 91 mg/dL
Hgb A1c MFr Bld: 4.8 % (ref 4.8–5.6)

## 2023-03-11 NOTE — Assessment & Plan Note (Signed)
Patient was told she has a history of anemia but is unclear as to what kind.  Will draw a CBC as well as anemia panel today.

## 2023-03-11 NOTE — Assessment & Plan Note (Signed)
Historical diagnosis of vitamin D deficiency.  Will draw a vitamin D level today to assess where patient's levels.  Plan to discuss labs at next appointment.

## 2023-03-13 ENCOUNTER — Ambulatory Visit (INDEPENDENT_AMBULATORY_CARE_PROVIDER_SITE_OTHER): Payer: Medicaid Other | Admitting: Family Medicine

## 2023-03-13 VITALS — BP 102/68 | HR 78 | Temp 98.4°F | Ht 66.0 in | Wt 273.0 lb

## 2023-03-13 DIAGNOSIS — E559 Vitamin D deficiency, unspecified: Secondary | ICD-10-CM

## 2023-03-13 DIAGNOSIS — E785 Hyperlipidemia, unspecified: Secondary | ICD-10-CM | POA: Diagnosis not present

## 2023-03-13 DIAGNOSIS — E88819 Insulin resistance, unspecified: Secondary | ICD-10-CM

## 2023-03-13 DIAGNOSIS — Z6841 Body Mass Index (BMI) 40.0 and over, adult: Secondary | ICD-10-CM

## 2023-03-13 MED ORDER — VITAMIN D (ERGOCALCIFEROL) 1.25 MG (50000 UNIT) PO CAPS: 50000.0000 [IU] | ORAL_CAPSULE | ORAL | 0 refills | Status: DC

## 2023-03-13 NOTE — Assessment & Plan Note (Signed)
Discussed importance of vitamin d supplementation.  Vitamin d supplementation has been shown to decrease fatigue, decrease risk of progression to insulin resistance and then prediabetes, decreases risk of falling in older age and can even assist in decreasing depressive symptoms in PTSD.   Prescription for Vitamin D sent in.

## 2023-03-13 NOTE — Assessment & Plan Note (Addendum)
 LDL of 126 which is close to prior level of 121.  She is not on medication.  Elevation likely due to higher saturated fat content in prepared foods she is eating.  She has been consistent with her food changes over the last few weeks. Repeat labs in 4 months.

## 2023-03-13 NOTE — Progress Notes (Signed)
 SUBJECTIVE:  Chief Complaint: Obesity  Interim History: Patient has done well following the meal plan allotted to her.  She couldn't figure out the food scale given to her.  She has been decreasing her intake of soda since last appointment.  She has been taking pictures of her food and upon review patient is focusing on increasing protein intake.   She isn't able to eat all the food allotted to her but she is working on getting more nutrition in daily.   Morgan Mathews is here to discuss her progress with her obesity treatment plan. She is on the keeping a food journal and adhering to recommended goals of 1400 calories and 90 grams of protein and states she is following her eating plan approximately 50 % of the time. She states she is exercising 20 minutes 1 times.   OBJECTIVE: Visit Diagnoses: Problem List Items Addressed This Visit       Endocrine   Insulin resistance   Pathophysiology of progression through insulin resistance to prediabetes and diabetes was discussed at length today.  Patient to continue to monitor and be in control of total intake of snack calories which may be simple carbohydrates but should be consumed only after the patient has taken in all the nutrition for the day.  Macronutrient identification, classification and daily intake ratios were discussed.  Plan to repeat labs in 3 months to monitor both hemoglobin A1c and insulin levels.  No medications at this time as patient is not having significant hunger or cravings that would make following meal plan more difficult.           Other   Hyperlipidemia LDL goal <100   LDL of 126 which is close to prior level of 121.  She is not on medication.  Elevation likely due to higher saturated fat content in prepared foods she is eating.  She has been consistent with her food changes over the last few weeks. Repeat labs in 4 months.      Vitamin D deficiency - Primary   Discussed importance of vitamin d supplementation.  Vitamin  d supplementation has been shown to decrease fatigue, decrease risk of progression to insulin resistance and then prediabetes, decreases risk of falling in older age and can even assist in decreasing depressive symptoms in PTSD.   Prescription for Vitamin D sent in.        Relevant Medications   Vitamin D, Ergocalciferol, (DRISDOL) 1.25 MG (50000 UNIT) CAPS capsule      03/13/2023    8:00 AM 02/27/2023    7:00 AM 01/02/2023    8:38 AM  Vitals with BMI  Height 5\' 6"  5\' 6"    Weight 273 lbs 278 lbs 282 lbs 8 oz  BMI 44.08 44.89 47.01  Systolic 102 113 409  Diastolic 68 69 72  Pulse 78 69 67    No data recorded  No data recorded  No data recorded  No data recorded    ASSESSMENT AND PLAN:  Diet: [name] is currently in the action stage of change. As such, her goal is to continue with weight loss efforts. She has agreed to continue with her makeshift plan that includes breakfast of 3 eggs and 1 cup of vegetables.  Patient was instructed she may be able to use 1 tablespoon of cooking oil.  Lunch will be 3 chicken thighs, 1 cup of vegetables.  Dinner to include 5 chicken thighs, 2 cups of vegetables.  Over the course of the day patient was  instructed she can have a total of 2 cups of rice or half of the container of the beef teriyaki noodles that upon review of nutritional data showed 1 container equal to 500 cal with only 10 g of protein.  She was also told she may have 1 small 8 ounce can of Coke a day.  She is going to look to see if there is a nutrition label on some of the ethnic foods that she is not always making at home.  Ultimately this should lead to a total calorie intake around 1400 cal and 90 or more grams of protein.  Exercise: Theone has been instructed that some exercise is better than none for weight loss and overall health benefits.   Behavior Modification:  We discussed the following Behavioral Modification Strategies today: increasing lean protein intake,  increasing vegetables, no skipping meals, and meal planning and cooking strategies.   No follow-ups on file.Marland Kitchen She was informed of the importance of frequent follow up visits to maximize her success with intensive lifestyle modifications for her multiple health conditions.  Attestation Statements:   Reviewed by clinician on day of visit: allergies, medications, problem list, medical history, surgical history, family history, social history, and previous encounter notes.       Reuben Likes, MD

## 2023-03-24 NOTE — Assessment & Plan Note (Signed)

## 2023-04-18 ENCOUNTER — Ambulatory Visit: Payer: Self-pay

## 2023-04-18 NOTE — Telephone Encounter (Signed)
 Report to Emergency Department/Urgent Care/call 911 for immediate medical evaluation. Follow-up with Primary Care.

## 2023-04-18 NOTE — Telephone Encounter (Signed)
  Chief Complaint: Vaginal itch, white discharge, discomfort Symptoms: above Frequency: 3 days Pertinent Negatives: Patient denies fever, smell Disposition: [] ED /[] Urgent Care (no appt availability in office) / [] Appointment(In office/virtual)/ []  [facility]/ [x] Home Care/ [] Refused Recommended Disposition /[] [facility]/ []  Follow-up with PCP Additional Notes: PT believes she has an vaginal yeast infections. Gave home care advice. PT will go to pharmacy and get OTC medications for yeast infection. Pt will call back or go to UC if s/s do not improve with home care.   Summary: Possible yeast infection, no appt available   Copied From CRM [phone]. Reason for Triage: Pt is experiencing yeast infection symptoms, no appt available. Seeking treatment.  Best contact: [phone]       Reason for Disposition  [1] Symptoms of a yeast infection (i.e., itchy, white discharge, not bad smelling) AND [2] feels like prior vaginal yeast infections  Answer Assessment - Initial Assessment Questions 1. SYMPTOM: "What's the main symptom you're concerned about?" (e.g., pain, itching, dryness)     Itchy - white discharge - some discomfort 2. LOCATION: "Where is the  s/s located?" (e.g., inside/outside, left/right)     Vaginal area 3. ONSET: "When did the  s/s  start?"     weds 4. PAIN: "Is there any pain?" If Yes, ask: "How bad is it?" (Scale: 1-10; mild, moderate, severe)   -  MILD (1-3): Doesn't interfere with normal activities.    -  MODERATE (4-7): Interferes with normal activities (e.g., work or school) or awakens from sleep.     -  SEVERE (8-10): Excruciating pain, unable to do any normal activities.     mild 5. ITCHING: "Is there any itching?" If Yes, ask: "How bad is it?" (Scale: 1-10; mild, moderate, severe)     Yes - moderate 6. CAUSE: "What do you think is causing the discharge?" "Have you had the same problem before? What happened then?"     Yeast infection 7. OTHER  SYMPTOMS: "Do you have any other symptoms?" (e.g., fever, itching, vaginal bleeding, pain with urination, injury to genital area, vaginal foreign body)     no  Protocols used: Vaginal Symptoms-A-AH

## 2023-04-21 ENCOUNTER — Encounter (INDEPENDENT_AMBULATORY_CARE_PROVIDER_SITE_OTHER): Payer: Self-pay | Admitting: Family Medicine

## 2023-04-21 ENCOUNTER — Ambulatory Visit (INDEPENDENT_AMBULATORY_CARE_PROVIDER_SITE_OTHER): Payer: Medicaid Other | Admitting: Family Medicine

## 2023-04-21 ENCOUNTER — Ambulatory Visit (INDEPENDENT_AMBULATORY_CARE_PROVIDER_SITE_OTHER): Admitting: Family Medicine

## 2023-04-21 VITALS — BP 116/77 | HR 81 | Temp 98.4°F | Ht 66.0 in | Wt 267.0 lb

## 2023-04-21 DIAGNOSIS — E785 Hyperlipidemia, unspecified: Secondary | ICD-10-CM

## 2023-04-21 DIAGNOSIS — Z6841 Body Mass Index (BMI) 40.0 and over, adult: Secondary | ICD-10-CM

## 2023-04-21 DIAGNOSIS — E559 Vitamin D deficiency, unspecified: Secondary | ICD-10-CM

## 2023-04-21 MED ORDER — VITAMIN D (ERGOCALCIFEROL) 1.25 MG (50000 UNIT) PO CAPS: 50000.0000 [IU] | ORAL_CAPSULE | ORAL | 0 refills | Status: DC

## 2023-04-21 NOTE — Progress Notes (Signed)
 SUBJECTIVE:  Chief Complaint: Obesity  Interim History: Patient is celebrating Ramadan and mentions that while the timing of her food consumption has changed she is still eating the same.  She is not able to get all the food in during her time to eat.  She has to eat after 7pm and around 3-4am.  Ramadan ends March 31.  No anticipated events or activities after Ramadan ends.   Morgan Mathews is here to discuss her progress with her obesity treatment plan. She is on the fasting and personalized plan and states she is following her eating plan approximately 80 % of the time. She states she is not exercising.   OBJECTIVE: Visit Diagnoses: Problem List Items Addressed This Visit       Other   Hyperlipidemia LDL goal <100   Patient is working toward limiting saturated fat intake and focusing on increasing total protein intake.  Will aim to limit fat intake to 20% total calories and intake and will need repeat labs in 3 months      Morbid obesity (HCC)   Anthropometric Measurements Height: 5\' 6"  (1.676 m) Weight: 267 lb (121.1 kg) BMI (Calculated): 43.12 Weight at Last Visit: 273 lb Weight Lost Since Last Visit: 6 Weight Gained Since Last Visit: 0 Starting Weight: 278 lb Total Weight Loss (lbs): 11 lb (4.99 kg) Body Composition  Body Fat %: 50 % Fat Mass (lbs): 133.8 lbs Muscle Mass (lbs): 126.8 lbs Total Body Water (lbs): 87.4 lbs Visceral Fat Rating : 14 Other Clinical Data Today's Visit #: 3 Starting Date: 02/27/23 Comments: Personalized       Vitamin D deficiency - Primary   Doing well on prescription strength vitamin D.  Needs a refill today- will refill for 3 months then transition to a daily OTC      Relevant Medications   Vitamin D, Ergocalciferol, (DRISDOL) 1.25 MG (50000 UNIT) CAPS capsule   Other Visit Diagnoses       BMI 40.0-44.9, adult (HCC)           No data recorded   Vitals:   04/21/23 1200  BP: 116/77  Pulse: 81  Temp: 98.4 F (36.9 C)  SpO2:  99%       ASSESSMENT AND PLAN:  Diet: Morgan Mathews is currently in the action stage of change. As such, her goal is to continue with weight loss efforts and has agreed to follow the plan previously discussed and planned out. Breakfast of 3 eggs and 1 cup of vegetables.  Patient was instructed she may be able to use 1 tablespoon of cooking oil.  Lunch will be 3 chicken thighs, 1 cup of vegetables.  Dinner to include 5 chicken thighs, 2 cups of vegetables.  Over the course of the day patient was instructed she can have a total of 2 cups of rice or half of the container of the beef teriyaki noodles that upon review of nutritional data showed 1 container equal to 500 cal with only 10 g of protein.  She was also told she may have 1 small 8 ounce can of Coke a day.  She is going to look to see if there is a nutrition label on some of the ethnic foods that she is not always making at home.  Ultimately this should lead to a total calorie intake around 1400 cal and 90 or more grams of protein.   Exercise:  All adults should avoid inactivity. Some activity is better than none, and adults who participate in  any amount of physical activity, gain some health benefits.  Behavior Modification:  We discussed the following Behavioral Modification Strategies today: increasing lean protein intake, no skipping meals, meal planning and cooking strategies, avoiding temptations, and reintroduce foods according to plan after Ramadan ends   Return in about 2 weeks (around 05/05/2023).Marland Kitchen She was informed of the importance of frequent follow up visits to maximize her success with intensive lifestyle modifications for her multiple health conditions.  Attestation Statements:   Reviewed by clinician on day of visit: allergies, medications, problem list, medical history, surgical history, family history, social history, and previous encounter notes.   Reuben Likes, MD

## 2023-04-21 NOTE — Assessment & Plan Note (Signed)
 Doing well on prescription strength vitamin D.  Needs a refill today- will refill for 3 months then transition to a daily OTC

## 2023-04-28 NOTE — Assessment & Plan Note (Signed)
 Patient is working toward limiting saturated fat intake and focusing on increasing total protein intake.  Will aim to limit fat intake to 20% total calories and intake and will need repeat labs in 3 months

## 2023-04-28 NOTE — Assessment & Plan Note (Signed)
 Anthropometric Measurements Height: 5\' 6"  (1.676 m) Weight: 267 lb (121.1 kg) BMI (Calculated): 43.12 Weight at Last Visit: 273 lb Weight Lost Since Last Visit: 6 Weight Gained Since Last Visit: 0 Starting Weight: 278 lb Total Weight Loss (lbs): 11 lb (4.99 kg) Body Composition  Body Fat %: 50 % Fat Mass (lbs): 133.8 lbs Muscle Mass (lbs): 126.8 lbs Total Body Water (lbs): 87.4 lbs Visceral Fat Rating : 14 Other Clinical Data Today's Visit #: 3 Starting Date: 02/27/23 Comments: Personalized

## 2023-05-26 ENCOUNTER — Ambulatory Visit (INDEPENDENT_AMBULATORY_CARE_PROVIDER_SITE_OTHER): Admitting: Family Medicine

## 2023-07-16 ENCOUNTER — Encounter: Admitting: Family

## 2023-07-16 NOTE — Progress Notes (Signed)
 Erroneous encounter-disregard

## 2023-07-30 ENCOUNTER — Ambulatory Visit (INDEPENDENT_AMBULATORY_CARE_PROVIDER_SITE_OTHER): Admitting: Family Medicine

## 2023-07-30 DIAGNOSIS — Z6841 Body Mass Index (BMI) 40.0 and over, adult: Secondary | ICD-10-CM | POA: Diagnosis not present

## 2023-07-30 DIAGNOSIS — E559 Vitamin D deficiency, unspecified: Secondary | ICD-10-CM

## 2023-07-30 MED ORDER — VITAMIN D (ERGOCALCIFEROL) 1.25 MG (50000 UNIT) PO CAPS: 50000.0000 [IU] | ORAL_CAPSULE | ORAL | 0 refills | Status: DC

## 2023-07-30 MED ORDER — WEGOVY 0.25 MG/0.5ML ~~LOC~~ SOAJ: 0.2500 mg | SUBCUTANEOUS | 0 refills | Status: DC

## 2023-07-30 NOTE — Assessment & Plan Note (Signed)
 Discussed with patient various medication options for treatment of obesity.  She is interested in pursuing [phone] and we talked about length of treatment (likely lifelong), common side effects (nausea, feeling fuller faster and feeling fuller longer).  I mentioned the significant and severe risk of pancreatitis with any injectable medications and gave symptoms for which she would need to seek medical evaluation if she was worried about this (significant abdominal pain, vomiting, intolerance of food or liquid).  We discussed titration schedule and the likelihood of needing to increase at least every other month pending insurance requirements.  Starting dose of Wegovy sent to pharmacy.  We discussed that if patient gets pregnant (which she is not anticipating) that she will need to go off this medication.

## 2023-07-30 NOTE — Assessment & Plan Note (Signed)
 Doing well on prescription strength vitamin D.  Needs a refill today- will refill for 3 months then transition to a daily OTC

## 2023-07-30 NOTE — Progress Notes (Signed)
 SUBJECTIVE:  Chief Complaint: Obesity  Interim History: Patient voices she has been very busy- her son was in the hospital for a while and ultimately got diagnosed with G6PD deficiency.  There is no dietary restrictions except for not eating fava beans.  She has restarted the meal plan recently.   Morgan Mathews is here to discuss her progress with her obesity treatment plan. She is on the personalized plan and states she is following her eating plan approximately 50 % of the time. She states she is not exercising.   OBJECTIVE: Visit Diagnoses: Problem List Items Addressed This Visit       Other   Morbid obesity (HCC) - Primary   Vitamin D  deficiency   Other Visit Diagnoses       BMI 40.0-44.9, adult [facility])          Assessment & Plan Morbid obesity (HCC) Discussed with patient various medication options for treatment of obesity.  She is interested in pursuing [phone] and we talked about length of treatment (likely lifelong), common side effects (nausea, feeling fuller faster and feeling fuller longer).  I mentioned the significant and severe risk of pancreatitis with any injectable medications and gave symptoms for which she would need to seek medical evaluation if she was worried about this (significant abdominal pain, vomiting, intolerance of food or liquid).  We discussed titration schedule and the likelihood of needing to increase at least every other month pending insurance requirements.  Starting dose of Wegovy sent to pharmacy.  We discussed that if patient gets pregnant (which she is not anticipating) that she will need to go off this medication. BMI 40.0-44.9, adult (HCC)  Vitamin D  deficiency Doing well on prescription strength vitamin D .  Needs a refill today- will refill for 3 months then transition to a daily OTC    Vitals Temp: 98.3 F (36.8 C) BP: 107/71 Pulse Rate: 77 SpO2: 98 %   Anthropometric Measurements Height: 5' 6 (1.676 m) Weight: 270 lb (122.5  kg) BMI (Calculated): 43.6 Weight at Last Visit: 267 lb Weight Lost Since Last Visit: 3 Weight Gained Since Last Visit: 0 Starting Weight: 278 lb Total Weight Loss (lbs): 8 lb (3.629 kg)   Body Composition  Body Fat %: 50 % Fat Mass (lbs): 135 lbs Muscle Mass (lbs): 128.2 lbs Total Body Water (lbs): 88.6 lbs Visceral Fat Rating : 14   Other Clinical Data Today's Visit #: 4 Starting Date: 02/27/23 Comments: Personalized     ASSESSMENT AND PLAN:  Diet: Ailsa is currently in the action stage of change. As such, her goal is to continue with weight loss efforts and has agreed:Breakfast of 3 eggs and 1 cup of vegetables.  Patient was instructed she may be able to use 1 tablespoon of cooking oil.  Lunch will be 3 chicken thighs, 1 cup of vegetables.  Dinner to include 5 chicken thighs, 2 cups of vegetables.  Over the course of the day patient was instructed she can have a total of 2 cups of rice or half of the container of the beef teriyaki noodles that upon review of nutritional data showed 1 container equal to 500 cal with only 10 g of protein.  She was also told she may have 1 small 8 ounce can of Coke a day.  She is going to look to see if there is a nutrition label on some of the ethnic foods that she is not always making at home.  Ultimately this should lead to a total  calorie intake around 1400 cal and 90 or more grams of protein.    Exercise:  All adults should avoid inactivity. Some activity is better than none, and adults who participate in any amount of physical activity, gain some health benefits.  Behavior Modification:  We discussed the following Behavioral Modification Strategies today: increasing lean protein intake, decreasing simple carbohydrates, increasing vegetables, and planning for success. We discussed various medication options to help Morgan Mathews with her weight loss efforts and we both agreed to start [facility] 0.25mg  weekly.  Return in about 3 weeks (around  08/20/2023).   She was informed of the importance of frequent follow up visits to maximize her success with intensive lifestyle modifications for her multiple health conditions.  Attestation Statements:   Reviewed by clinician on day of visit: allergies, medications, problem list, medical history, surgical history, family history, social history, and previous encounter notes.     Adelita Cho, MD

## 2023-07-31 ENCOUNTER — Telehealth (INDEPENDENT_AMBULATORY_CARE_PROVIDER_SITE_OTHER): Payer: Self-pay

## 2023-07-31 NOTE — Telephone Encounter (Signed)
 PA was denied for [facility] from Plan Request Reference Number: [id number]. WEGOVY INJ 0.25MG  is denied for not meeting the prior authorization requirement(s). For further questions, call [employer] at [phone] for more information.

## 2023-08-04 ENCOUNTER — Encounter (INDEPENDENT_AMBULATORY_CARE_PROVIDER_SITE_OTHER): Payer: Self-pay

## 2023-08-04 ENCOUNTER — Telehealth (INDEPENDENT_AMBULATORY_CARE_PROVIDER_SITE_OTHER): Payer: Self-pay | Admitting: Family Medicine

## 2023-08-04 NOTE — Telephone Encounter (Signed)
 Message sent to pt-CS

## 2023-08-04 NOTE — Telephone Encounter (Signed)
 Pt called in stating she was not able to get her weight loss shot, there was an issue with the pharmacy and the pharmacy told the pt they would reach out to the provider. The pt does not remember the name of the medication. Please follow up with the patient.

## 2023-08-21 ENCOUNTER — Encounter: Payer: Self-pay | Admitting: Family

## 2023-08-21 ENCOUNTER — Ambulatory Visit (INDEPENDENT_AMBULATORY_CARE_PROVIDER_SITE_OTHER): Admitting: Family

## 2023-08-21 VITALS — BP 105/72 | HR 71 | Temp 97.9°F | Resp 18 | Ht 66.0 in | Wt 269.6 lb

## 2023-08-21 DIAGNOSIS — E669 Obesity, unspecified: Secondary | ICD-10-CM

## 2023-08-21 DIAGNOSIS — Z7689 Persons encountering health services in other specified circumstances: Secondary | ICD-10-CM

## 2023-08-21 DIAGNOSIS — Z6841 Body Mass Index (BMI) 40.0 and over, adult: Secondary | ICD-10-CM | POA: Diagnosis not present

## 2023-08-21 MED ORDER — PHENTERMINE HCL 15 MG PO CAPS: 15.0000 mg | ORAL_CAPSULE | ORAL | 0 refills | Status: DC

## 2023-08-21 NOTE — Progress Notes (Signed)
 Patient ID: Morgan Mathews, female    DOB: 01/21/1991  MRN: [id number]  CC: Weight Loss  Subjective: Morgan Mathews is a 33 y.o. female who presents for weight loss.   Her concerns today include:  Reports established with weight specialist who prescribed Semaglutide -Weight Management. States she did not begin Semaglutide -Weight Management because her health insurance would not cover cost. She would like to try weight loss pill. She is watching what she eats. She does not exercise. Reports she has an upcoming appointment with weight specialist.   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 RevitaPlus Womens Vitamin     Vitamin D , Ergocalciferol , (DRISDOL ) 1.25 MG (50000 UNIT) CAPS capsule Take 1 capsule (50,000 Units total) by mouth every 7 (seven) days. 12 capsule 0   Semaglutide -Weight Management (WEGOVY ) 0.25 MG/0.5ML SOAJ Inject 0.25 mg into the skin once a week. (Patient not taking: Reported on 08/21/2023) 2 mL 0   No current facility-administered medications on file prior to visit.    No Known Allergies  Social History   Socioeconomic History   Marital status: Married    Spouse name: Musah Iddrisu   Number of children: 2   Years of education: 8   Highest education level: 12th grade  Occupational History   Not on file  Tobacco Use    Smoking status: Never   Smokeless tobacco: Never  Vaping Use   Vaping status: Never Used  Substance and Sexual Activity   Alcohol use: No   Drug use: No   Sexual activity: Yes    Birth control/protection: None  Other Topics Concern   Not on file  Social History Narrative   Originally from [place]   Came to [employer] [place] [employer]. In 2015.   Lives at home with husband and 2 children.   Social Drivers of Corporate investment banker Strain: Low Risk  (12/24/2022)   Overall Financial Resource Strain (CARDIA)    Difficulty of Paying Living Expenses: Not hard at all  Food Insecurity: No Food Insecurity (08/21/2023)   Hunger Vital Sign    Worried About Running Out of Food in the Last Year: Never true    Ran Out of Food in the Last Year: Never true  Transportation Needs: No Transportation Needs (08/21/2023)   PRAPARE - Administrator, Civil Service (Medical): No    Lack of Transportation (Non-Medical): No  Physical Activity: Insufficiently Active (12/24/2022)   Exercise Vital Sign    Days of Exercise per Week: 4 days    Minutes of Exercise per Session: 30 min  Stress: No Stress Concern Present (12/24/2022)   [employer] of Occupational Health - Occupational Stress Questionnaire    Feeling of Stress : Not at all  Social Connections: Socially Integrated (12/24/2022)   Social Connection and Isolation Panel    Frequency of Communication with Friends and Family:  More than three times a week    Frequency of Social Gatherings with Friends and Family: Never    Attends Religious Services: More than 4 times per year    Active Member of [employer] or Organizations: Yes    Attends Banker Meetings: Never    Marital Status: Married  Catering manager Violence: Not At Risk (08/21/2023)   Humiliation, Afraid, Rape, and Kick questionnaire    Fear of Current or Ex-Partner: No    Emotionally Abused: No    Physically Abused: No    Sexually Abused: No    Family History  Problem Relation Age  of Onset   Diabetes Mother    Hyperlipidemia Father    Healthy Father     Past Surgical History:  Procedure Laterality Date   NO PAST SURGERIES      ROS: Review of Systems Negative except as stated above  PHYSICAL EXAM: BP 105/72   Pulse 71   Temp 97.9 F (36.6 C) (Oral)   Resp 18   Ht 5' 6 (1.676 m)   Wt 269 lb 9.6 oz (122.3 kg)   LMP 07/10/2023 (Exact Date)   SpO2 97%   BMI 43.51 kg/m   Wt Readings from Last 3 Encounters:  08/21/23 269 lb 9.6 oz (122.3 kg)  07/30/23 270 lb (122.5 kg)  04/21/23 267 lb (121.1 kg)   Physical Exam HENT:     Head: Normocephalic and atraumatic.     Nose: Nose normal.     Mouth/Throat:     Mouth: Mucous membranes are moist.     Pharynx: Oropharynx is clear.  Eyes:     Extraocular Movements: Extraocular movements intact.     Conjunctiva/sclera: Conjunctivae normal.     Pupils: Pupils are equal, round, and reactive to light.  Cardiovascular:     Rate and Rhythm: Normal rate and regular rhythm.     Pulses: Normal pulses.     Heart sounds: Normal heart sounds.  Pulmonary:     Effort: Pulmonary effort is normal.     Breath sounds: Normal breath sounds.  Musculoskeletal:        General: Normal range of motion.     Cervical back: Normal range of motion and neck supple.  Neurological:     General: No focal deficit present.     Mental Status: She is alert and oriented to person, place, and time.  Psychiatric:        Mood and Affect: Mood normal.        Behavior: Behavior normal.    ASSESSMENT AND PLAN: 1. Encounter for weight management (Primary) 2. BMI 40.0-44.9, adult (HCC) - Phentermine  as prescribed. Counseled on medication adherence and adverse effects. - I did check the [place]  prescription drug database. - Counseled on low-sodium DASH diet and 150 minutes of moderate intensity exercise per week as tolerated to assist with weight management.  - Keep all scheduled appointments with weight specialist. - Follow-up  with primary provider as scheduled. - phentermine  15 MG capsule; Take 1 capsule (15 mg total) by mouth every morning.  Dispense: 30 capsule; Refill: 0   Patient was given the opportunity to ask questions.  Patient verbalized understanding of the plan and was able to repeat key elements of the plan. Patient was given clear instructions to go to Emergency Department or return to medical center if symptoms don't improve, worsen, or new problems develop.The patient verbalized understanding.   Requested Prescriptions   Signed Prescriptions Disp Refills   phentermine   15 MG capsule 30 capsule 0    Sig: Take 1 capsule (15 mg total) by mouth every morning.    Follow-up with primary provider as scheduled.  Greig JINNY Drones, NP

## 2023-08-21 NOTE — Progress Notes (Signed)
Patient wants to discuss weight loss medication.

## 2023-09-02 ENCOUNTER — Telehealth (INDEPENDENT_AMBULATORY_CARE_PROVIDER_SITE_OTHER): Payer: Self-pay

## 2023-09-02 ENCOUNTER — Encounter (INDEPENDENT_AMBULATORY_CARE_PROVIDER_SITE_OTHER): Payer: Self-pay | Admitting: Family Medicine

## 2023-09-02 ENCOUNTER — Ambulatory Visit (INDEPENDENT_AMBULATORY_CARE_PROVIDER_SITE_OTHER): Admitting: Family Medicine

## 2023-09-02 VITALS — BP 93/61 | HR 74 | Temp 98.3°F | Ht 66.0 in | Wt 267.0 lb

## 2023-09-02 DIAGNOSIS — E559 Vitamin D deficiency, unspecified: Secondary | ICD-10-CM | POA: Diagnosis not present

## 2023-09-02 DIAGNOSIS — E785 Hyperlipidemia, unspecified: Secondary | ICD-10-CM | POA: Diagnosis not present

## 2023-09-02 DIAGNOSIS — E88819 Insulin resistance, unspecified: Secondary | ICD-10-CM | POA: Diagnosis not present

## 2023-09-02 DIAGNOSIS — Z6841 Body Mass Index (BMI) 40.0 and over, adult: Secondary | ICD-10-CM | POA: Diagnosis not present

## 2023-09-02 MED ORDER — ZEPBOUND 2.5 MG/0.5ML ~~LOC~~ SOAJ: 2.5000 mg | SUBCUTANEOUS | 0 refills | Status: DC

## 2023-09-02 NOTE — Telephone Encounter (Signed)
 PA for Zepbound started.

## 2023-09-02 NOTE — Assessment & Plan Note (Signed)
 Breakfast of 3 eggs and 1 cup of vegetables. Patient was instructed she may be able to use 1 tablespoon of cooking oil. Lunch will be 3 chicken thighs, 1 cup of vegetables. Dinner to include 5 chicken thighs, 2 cups of vegetables. Over the course of the day patient was instructed she can have a total of 2 cups of rice or half of the container of the beef teriyaki noodles that upon review of nutritional data showed 1 container equal to 500 cal with only 10 g of protein. She was also told she may have 1 small 8 ounce can of Coke a day. She is going to look to see if there is a nutrition label on some of the ethnic foods that she is not always making at home. Ultimately this should lead to a total calorie intake around 1400 cal and 90 or more grams of protein.

## 2023-09-02 NOTE — Progress Notes (Unsigned)
 SUBJECTIVE:  Chief Complaint: Obesity  Interim History: Since last appointment she mentions she has had quite a bit going on. Her kids grandmother died.  She also has been eating more off plan than previously. Anticipating a normal next few weeks.  Shhe mentions she will be able to be consistent to her plan from previously except for substituting quinoa for rice and not liking the salad as much.    Morgan Mathews is here to discuss her progress with her obesity treatment plan. She is on the Personalized plan and states she is following her eating plan approximately 60 % of the time. She states she is not exercising, but is walking.   OBJECTIVE: Visit Diagnoses: Problem List Items Addressed This Visit       Endocrine   Insulin  resistance   Relevant Orders   Comprehensive metabolic panel with GFR (Completed)   Hemoglobin A1c (Completed)   Insulin , random (Completed)     Other   Hyperlipidemia LDL goal <100   Relevant Orders   Lipid Panel With LDL/HDL Ratio (Completed)   Morbid obesity (HCC)   Breakfast of 3 eggs and 1 cup of vegetables. Patient was instructed she may be able to use 1 tablespoon of cooking oil. Lunch will be 3 chicken thighs, 1 cup of vegetables. Dinner to include 5 chicken thighs, 2 cups of vegetables. Over the course of the day patient was instructed she can have a total of 2 cups of rice or half of the container of the beef teriyaki noodles that upon review of nutritional data showed 1 container equal to 500 cal with only 10 g of protein. She was also told she may have 1 small 8 ounce can of Coke a day. She is going to look to see if there is a nutrition label on some of the ethnic foods that she is not always making at home. Ultimately this should lead to a total calorie intake around 1400 cal and 90 or more grams of protein.       Relevant Medications   tirzepatide  (ZEPBOUND ) 2.5 MG/0.5ML Pen   Vitamin D  deficiency - Primary   Relevant Orders   VITAMIN D  25 Hydroxy  (Vit-D Deficiency, Fractures) (Completed)   Other Visit Diagnoses       BMI 40.0-44.9, adult (HCC)       Relevant Medications   tirzepatide  (ZEPBOUND ) 2.5 MG/0.5ML Pen       Vitals Temp: 98.3 F (36.8 C) BP: 93/61 Pulse Rate: 74 SpO2: 100 %   Anthropometric Measurements Height: 5' 6 (1.676 m) Weight: 267 lb (121.1 kg) BMI (Calculated): 43.12 Weight at Last Visit: 270 lb Weight Lost Since Last Visit: 3 Weight Gained Since Last Visit: 0 Starting Weight: 278 lb Total Weight Loss (lbs): 11 lb (4.99 kg)   Body Composition  Body Fat %: 50 % Fat Mass (lbs): 134 lbs Muscle Mass (lbs): 127 lbs Total Body Water (lbs): 87.4 lbs Visceral Fat Rating : 14   Other Clinical Data Fasting: no Labs: no Today's Visit #: 5 Starting Date: 02/27/23 Comments: Personalized     ASSESSMENT AND PLAN: Assessment & Plan Vitamin D  deficiency Vitamin D  level very low at initial labs done in January of this year.  Patient has been on prescription strength vitamin D  supplementation sporadically since that time.  Needs a repeat Vitamin D  level today to assess response to prescription strength supplementation.  If level increases dramatically may need to switch over to a different formulation of vitamin D . Hyperlipidemia LDL  goal <100 Last LDL slightly elevated.  Will repeat fasting lipid panel today with plans to discuss lab results at next appointment. Insulin  resistance Patient is fasting today for labs.  Needs a repeat CMP, A1c, insulin  level today.  Will discuss labs at next appointment.  Patient has been working on lifestyle changes including dietary modification and increasing physical activity. Morbid obesity (HCC) Breakfast of 3 eggs and 1 cup of vegetables. Patient was instructed she may be able to use 1 tablespoon of cooking oil. Lunch will be 3 chicken thighs, 1 cup of vegetables. Dinner to include 5 chicken thighs, 2 cups of vegetables. Over the course of the day patient was  instructed she can have a total of 2 cups of rice or half of the container of the beef teriyaki noodles that upon review of nutritional data showed 1 container equal to 500 cal with only 10 g of protein. She was also told she may have 1 small 8 ounce can of Coke a day. She is going to look to see if there is a nutrition label on some of the ethnic foods that she is not always making at home. Ultimately this should lead to a total calorie intake around 1400 cal and 90 or more grams of protein.  BMI 40.0-44.9, adult [facility])    Diet: Morgan Mathews is currently in the action stage of change. As such, her goal is to continue with weight loss efforts and has agreed to practicing portion control and making smarter food choices, such as increasing vegetables and decreasing simple carbohydrates. Breakfast of 3 eggs and 1 cup of vegetables. Patient was instructed she may be able to use 1 tablespoon of cooking oil. Lunch will be 3 chicken thighs, 1 cup of vegetables. Dinner to include 5 chicken thighs, 2 cups of vegetables. Over the course of the day patient was instructed she can have a total of 2 cups of rice or half of the container of the beef teriyaki noodles that upon review of nutritional data showed 1 container equal to 500 cal with only 10 g of protein. She was also told she may have 1 small 8 ounce can of Coke a day. She is going to look to see if there is a nutrition label on some of the ethnic foods that she is not always making at home. Ultimately this should lead to a total calorie intake around 1400 cal and 90 or more grams of protein.   Exercise:  For substantial health benefits, adults should do at least 150 minutes (2 hours and 30 minutes) a week of moderate-intensity, or 75 minutes (1 hour and 15 minutes) a week of vigorous-intensity aerobic physical activity, or an equivalent combination of moderate- and vigorous-intensity aerobic activity. Aerobic activity should be performed in episodes of at least 10  minutes, and preferably, it should be spread throughout the week.  Behavior Modification:  We discussed the following Behavioral Modification Strategies today: increasing lean protein intake, decreasing simple carbohydrates, increasing vegetables, and meal planning and cooking strategies. We discussed various medication options to help Morgan Mathews with her weight loss efforts and we both agreed to try to start zepbound  as wegovy  is not covered by patients insurance.  Return in about 4 weeks (around 09/30/2023).   She was informed of the importance of frequent follow up visits to maximize her success with intensive lifestyle modifications for her multiple health conditions.  Attestation Statements:   Reviewed by clinician on day of visit: allergies, medications, problem list, medical history,  surgical history, family history, social history, and previous encounter notes.   Morgan Cho, MD

## 2023-09-03 ENCOUNTER — Encounter (INDEPENDENT_AMBULATORY_CARE_PROVIDER_SITE_OTHER): Payer: Self-pay

## 2023-09-03 LAB — LIPID PANEL WITH LDL/HDL RATIO
Cholesterol, Total: 194 mg/dL (ref 100–199)
HDL: 73 mg/dL (ref >39)
LDL Chol Calc (NIH): 112 mg/dL — ABNORMAL HIGH (ref 0–99)
LDL/HDL Ratio: 1.5 ratio (ref 0.0–3.2)
Triglycerides: 48 mg/dL (ref 0–149)
VLDL Cholesterol Cal: 9 mg/dL (ref 5–40)

## 2023-09-03 LAB — INSULIN, RANDOM: INSULIN: 23.2 u[IU]/mL (ref 2.6–24.9)

## 2023-09-03 LAB — HEMOGLOBIN A1C
Est. average glucose Bld gHb Est-mCnc: 88 mg/dL
Hgb A1c MFr Bld: 4.7 % — ABNORMAL LOW (ref 4.8–5.6)

## 2023-09-03 LAB — COMPREHENSIVE METABOLIC PANEL WITH GFR
ALT: 21 IU/L (ref 0–32)
AST: 17 IU/L (ref 0–40)
Albumin: 4.2 g/dL (ref 3.9–4.9)
Alkaline Phosphatase: 68 IU/L (ref 44–121)
BUN/Creatinine Ratio: 16 (ref 9–23)
BUN: 13 mg/dL (ref 6–20)
Bilirubin Total: 0.4 mg/dL (ref 0.0–1.2)
CO2: 21 mmol/L (ref 20–29)
Calcium: 9.3 mg/dL (ref 8.7–10.2)
Chloride: 103 mmol/L (ref 96–106)
Creatinine, Ser: 0.83 mg/dL (ref 0.57–1.00)
Globulin, Total: 3.2 g/dL (ref 1.5–4.5)
Glucose: 90 mg/dL (ref 70–99)
Potassium: 4.1 mmol/L (ref 3.5–5.2)
Sodium: 140 mmol/L (ref 134–144)
Total Protein: 7.4 g/dL (ref 6.0–8.5)
eGFR: 97 mL/min/1.73 (ref >59)

## 2023-09-03 LAB — VITAMIN D 25 HYDROXY (VIT D DEFICIENCY, FRACTURES): Vit D, 25-Hydroxy: 13.4 ng/mL — ABNORMAL LOW (ref 30.0–100.0)

## 2023-09-03 NOTE — Assessment & Plan Note (Signed)
 Vitamin D  level very low at initial labs done in January of this year.  Patient has been on prescription strength vitamin D  supplementation sporadically since that time.  Needs a repeat Vitamin D  level today to assess response to prescription strength supplementation.  If level increases dramatically may need to switch over to a different formulation of vitamin D .

## 2023-09-03 NOTE — Assessment & Plan Note (Signed)
 Patient is fasting today for labs.  Needs a repeat CMP, A1c, insulin  level today.  Will discuss labs at next appointment.  Patient has been working on lifestyle changes including dietary modification and increasing physical activity.

## 2023-09-03 NOTE — Assessment & Plan Note (Signed)
 Last LDL slightly elevated.  Will repeat fasting lipid panel today with plans to discuss lab results at next appointment.

## 2023-10-06 ENCOUNTER — Ambulatory Visit (INDEPENDENT_AMBULATORY_CARE_PROVIDER_SITE_OTHER): Admitting: Family Medicine

## 2023-10-15 ENCOUNTER — Encounter (HOSPITAL_COMMUNITY): Payer: Self-pay | Admitting: *Deleted

## 2023-10-15 ENCOUNTER — Ambulatory Visit (HOSPITAL_COMMUNITY)
Admission: EM | Admit: 2023-10-15 | Discharge: 2023-10-15 | Disposition: A | Discharge status: Home / Self Care | Attending: Internal Medicine | Admitting: Internal Medicine

## 2023-10-15 ENCOUNTER — Other Ambulatory Visit: Payer: Self-pay

## 2023-10-15 DIAGNOSIS — Z3202 Encounter for pregnancy test, result negative: Secondary | ICD-10-CM

## 2023-10-15 DIAGNOSIS — K5900 Constipation, unspecified: Secondary | ICD-10-CM | POA: Diagnosis not present

## 2023-10-15 DIAGNOSIS — B349 Viral infection, unspecified: Secondary | ICD-10-CM | POA: Diagnosis not present

## 2023-10-15 DIAGNOSIS — R140 Abdominal distension (gaseous): Secondary | ICD-10-CM | POA: Diagnosis present

## 2023-10-15 DIAGNOSIS — R350 Frequency of micturition: Secondary | ICD-10-CM

## 2023-10-15 DIAGNOSIS — R051 Acute cough: Secondary | ICD-10-CM

## 2023-10-15 LAB — POCT URINALYSIS DIP (MANUAL ENTRY)
Bilirubin, UA: NEGATIVE
Blood, UA: NEGATIVE
Glucose, UA: NEGATIVE mg/dL
Ketones, POC UA: NEGATIVE mg/dL
Nitrite, UA: NEGATIVE
Protein Ur, POC: NEGATIVE mg/dL
Spec Grav, UA: 1.02 (ref 1.010–1.025)
Urobilinogen, UA: 1 U/dL
pH, UA: 6.5 (ref 5.0–8.0)

## 2023-10-15 LAB — POCT URINE PREGNANCY: Preg Test, Ur: NEGATIVE

## 2023-10-15 LAB — POC SARS CORONAVIRUS 2 AG -  ED: SARS Coronavirus 2 Ag: NEGATIVE

## 2023-10-15 MED ORDER — BENZONATATE 100 MG PO CAPS: 100.0000 mg | ORAL_CAPSULE | Freq: Three times a day (TID) | ORAL | 0 refills | Status: DC

## 2023-10-15 MED ORDER — GUAIFENESIN ER 1200 MG PO TB12: 1200.0000 mg | ORAL_TABLET | Freq: Two times a day (BID) | ORAL | 0 refills | Status: DC

## 2023-10-15 MED ORDER — POLYETHYLENE GLYCOL 3350 17 GM/SCOOP PO POWD: 17.0000 g | Freq: Every day | ORAL | 0 refills | Status: DC | PRN

## 2023-10-15 NOTE — ED Provider Notes (Signed)
 [facility]    CSN: [id number] Arrival date & time: 10/15/23  9175      History   Chief Complaint Chief Complaint  Patient presents with   Cough   Polyuria    HPI Morgan Mathews is a 33 y.o. female.   Morgan Mathews is a 33 y.o. female presenting for chief complaint of cough started 4 days ago as well as polyuria and movement sensation to the bilateral lower abdomen starting 1 week ago.  Cough is dry, nonproductive, and worse when she is trying to sleep.  Denies shortness of breath, runny nose, nasal congestion, sore throat, fever, chills, ear pain, headache, and recent sick contacts with similar symptoms.  Never smoker, denies history of asthma/COPD.  Denies recent antibiotic or steroid use.  She has not attempted use of any over-the-counter medications to help with symptoms PTA.  Bilateral lower abdominal movement sensation feels like when she was pregnant.  LMP September 22, 2023.  Reports urinary frequency without strong urinary odor, dysuria, flank pain, nausea, vomiting, dizziness, fever, chills, gross hematuria, urinary hesitancy, or low back pain.  States her stools have been smaller and more firm over the last 48 to 72 hours leading to suspicion she may be constipated.  She states she does not drink much water.  Denies frequent intake of urinary irritants/caffeine.  She is not on an SGLT2 inhibitor.  Denies blood/mucus in the stools and diarrhea.  Denies overt pain to the abdomen.  She has not attempted treatment of constipation or urinary symptoms at home.   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 Authorizing Provider  benzonatate  (TESSALON ) 100 MG capsule Take 1 capsule (100 mg total) by mouth every 8 (eight) hours. 10/15/23  Yes Enedelia Dorna HERO, FNP  Guaifenesin  1200 MG TB12 Take 1 tablet (1,200 mg total) by mouth in the morning and at bedtime. 10/15/23  Yes Enedelia Dorna HERO, FNP  polyethylene glycol powder (GLYCOLAX /MIRALAX ) 17 GM/SCOOP powder Take 17 g by mouth daily as needed. 10/15/23  Yes Enedelia Dorna HERO, FNP  UNABLE TO FIND Med Name: Women's Probiotic with [facility]  Yes [provider]  OVER THE COUNTER MEDICATION Take 1 tablet by mouth daily. RevitaPlus Womens Vitamin    [provider]  phentermine  15 MG capsule Take 1 capsule (15 mg total) by mouth every morning. Patient not taking: No sig reported 08/21/23   Lorren Greig PARAS, NP  tirzepatide  (ZEPBOUND ) 2.5 MG/0.5ML Pen Inject 2.5 mg into the skin once a week. 09/02/23   [place] Adelita PENNER, MD  Vitamin D , Ergocalciferol , (DRISDOL ) 1.25 MG (50000 UNIT) CAPS capsule Take 1 capsule (50,000 Units total) by mouth every 7 (seven) days. 07/30/23   [place] Adelita PENNER, MD    Family History Family History  Problem Relation Age of Onset   Diabetes Mother    Hyperlipidemia Father    Healthy Father     Social  History Social History   Tobacco Use   Smoking status: Never   Smokeless tobacco: Never  Vaping Use   Vaping status: Never Used  Substance Use Topics   Alcohol use: No   Drug use: No     Allergies   Patient has no known allergies.   Review of Systems Review of Systems  Respiratory:  Positive for cough.   Per HPI   Physical Exam Triage Vital Signs ED Triage Vitals  Encounter Vitals Group     BP 10/15/23 0836 115/78     Girls Systolic BP Percentile --      Girls Diastolic BP Percentile --      Boys Systolic BP Percentile --      Boys Diastolic BP Percentile --      Pulse Rate 10/15/23 0836 78     Resp 10/15/23 0836 18     Temp 10/15/23 0836 98 F (36.7 C)     Temp Source 10/15/23 0836 Oral     SpO2 10/15/23 0836 97 %     Weight --      Height --      Head Circumference --      Peak Flow --      Pain Score 10/15/23 0839 0     Pain Loc --      Pain Education --      Exclude from Growth Chart --    No data found.  Updated Vital Signs BP 115/78   Pulse 78   Temp 98 F (36.7 C) (Oral)   Resp 18   LMP 09/22/2023 (Exact Date)   SpO2 97%   Visual Acuity Right Eye Distance:   Left Eye Distance:   Bilateral Distance:    Right Eye Near:   Left Eye Near:    Bilateral Near:     Physical Exam Vitals and nursing note reviewed.  Constitutional:      Appearance: She is not ill-appearing or toxic-appearing.  HENT:     Head: Normocephalic and atraumatic.     Right Ear: Hearing, tympanic membrane, ear canal and external ear normal.     Left Ear: Hearing, tympanic membrane, ear canal and external ear normal.     Nose: Nose normal.     Mouth/Throat:     Lips: Pink.     Mouth: Mucous membranes are moist. No injury or oral lesions.     Dentition: Normal dentition.     Tongue: No lesions.     Pharynx: Oropharynx is clear. Uvula midline. No pharyngeal swelling, oropharyngeal exudate, posterior oropharyngeal erythema, uvula swelling or postnasal drip.      Tonsils: No tonsillar exudate.  Eyes:     General: Lids  are normal. Vision grossly intact. Gaze aligned appropriately.     Extraocular Movements: Extraocular movements intact.     Conjunctiva/sclera: Conjunctivae normal.  Neck:     Trachea: Trachea and phonation normal.  Cardiovascular:     Rate and Rhythm: Normal rate and regular rhythm.     Heart sounds: Normal heart sounds, S1 normal and S2 normal.  Pulmonary:     Effort: Pulmonary effort is normal. No respiratory distress.     Breath sounds: Normal breath sounds and air entry. No wheezing, rhonchi or rales.     Comments: Speaking in full sentences without difficulty.  No adventitious sounds heard to the lungs. Chest:     Chest wall: No tenderness.  Abdominal:     General: Bowel sounds are normal.     Palpations: Abdomen is soft.     Tenderness: There is no abdominal tenderness. There is no right CVA tenderness, left CVA tenderness or guarding.     Comments: Benign abdominal exam.  Musculoskeletal:     Cervical back: Neck supple.  Lymphadenopathy:     Cervical: No cervical adenopathy.  Skin:    General: Skin is warm and dry.     Capillary Refill: Capillary refill takes less than 2 seconds.     Findings: No rash.  Neurological:     General: No focal deficit present.     Mental Status: She is alert and oriented to person, place, and time. Mental status is at baseline.     Cranial Nerves: No dysarthria or facial asymmetry.  Psychiatric:        Mood and Affect: Mood normal.        Speech: Speech normal.        Behavior: Behavior normal.        Thought Content: Thought content normal.        Judgment: Judgment normal.      UC Treatments / Results  Labs (all labs ordered are listed, but only abnormal results are displayed) Labs Reviewed  POCT URINALYSIS DIP (MANUAL ENTRY) - Abnormal; Notable for the following components:      Result Value   Clarity, UA turbid (*)    Leukocytes, UA Trace (*)    All other components  within normal limits  URINE CULTURE  POCT URINE PREGNANCY  POC SARS CORONAVIRUS 2 AG -  ED    EKG   Radiology No results found.  Procedures Procedures (including critical care time)  Medications Ordered in UC Medications - No data to display  Initial Impression / Assessment and Plan / UC Course  I have reviewed the triage vital signs and the nursing notes.  Pertinent labs & imaging results that were available during my care of the patient were reviewed by me and considered in my medical decision making (see chart for details).   1.  Acute cough, viral URI Suspect viral URI, viral syndrome.  Strep/viral testing: Point-of-care COVID-negative.  Physical exam findings reassuring, vital signs hemodynamically stable, and lungs clear, therefore deferred imaging of the chest.  Advised supportive care/prescriptions for symptomatic relief as outlined in AVS.    2. Constipation, urinary frequency Presentation consistent with constipation. Will manage this with Miralax  as directed, daily stool softener, and increased water/fiber intake. No peritoneal signs to abdominal exam. No indication for imaging of the abdomen, low suspicion for obstruction etiology.  Return to clinic or go to ER if no bowel movement production in 24-48 hours.    UA shows turbid urine with trace leukocytes. Urine  culture is pending. Will treat for UTI based on urine culture results.  Negative urine pregnancy test.  Advised to increase fluid intake to maintain hydration. No signs of pyelonephritis, nephrolithiasis, etc.  Counseled patient on potential for adverse effects with medications prescribed/recommended today, strict ER and return-to-clinic precautions discussed, patient verbalized understanding.     Final Clinical Impressions(s) / UC Diagnoses   Final diagnoses:  Acute cough  Constipation, unspecified constipation type     Discharge Instructions      COVID test is negative.  Take Mucinex   every 12 hours as needed for cough and congestion.  You may use Tessalon  Perles 1 pill every 8 hours as needed for coughing.  Please use a immediate fire in your room to help with cough at nighttime.   Your urine pregnancy test is negative here. Urinalysis looks good today, however I have sent your urine for a culture and we will call if the urine shows UTI in the next few days when the results come back.  I suspect that the movement in your abdomen is due to constipation.  Please use MiraLAX  once daily for the next 2 to 3 days until your stools are toothpaste consistency, then use as needed for constipation.  In order for MiraLAX  to work well, you will need to drink at least 64 ounces of water per day if not more to add water to the gut and soften the stool.  Eat foods high in fiber such as fruits, vegetables, and whole grains.  Avoid sitting for long periods of time on the toilet as this can lead to hemorrhoids.  If you develop any new or worsening symptoms or if your symptoms do not start to improve, please return here or follow-up with your primary care provider. If your symptoms are severe, please go to the emergency room.     ED Prescriptions     Medication Sig Dispense Auth. Provider   polyethylene glycol powder (GLYCOLAX /MIRALAX ) 17 GM/SCOOP powder Take 17 g by mouth daily as needed. 255 g Enedelia Going M, FNP   Guaifenesin  1200 MG TB12 Take 1 tablet (1,200 mg total) by mouth in the morning and at bedtime. 14 tablet Enedelia Going M, FNP   benzonatate  (TESSALON ) 100 MG capsule Take 1 capsule (100 mg total) by mouth every 8 (eight) hours. 21 capsule Enedelia Going HERO, FNP      PDMP not reviewed this encounter.   Enedelia Going [place], [place] 10/15/23 [phone]

## 2023-10-15 NOTE — Discharge Instructions (Addendum)
 COVID test is negative.  Take Mucinex  every 12 hours as needed for cough and congestion.  You may use Tessalon  Perles 1 pill every 8 hours as needed for coughing.  Please use a immediate fire in your room to help with cough at nighttime.   Your urine pregnancy test is negative here. Urinalysis looks good today, however I have sent your urine for a culture and we will call if the urine shows UTI in the next few days when the results come back.  I suspect that the movement in your abdomen is due to constipation.  Please use MiraLAX  once daily for the next 2 to 3 days until your stools are toothpaste consistency, then use as needed for constipation.  In order for MiraLAX  to work well, you will need to drink at least 64 ounces of water per day if not more to add water to the gut and soften the stool.  Eat foods high in fiber such as fruits, vegetables, and whole grains.  Avoid sitting for long periods of time on the toilet as this can lead to hemorrhoids.  If you develop any new or worsening symptoms or if your symptoms do not start to improve, please return here or follow-up with your primary care provider. If your symptoms are severe, please go to the emergency room.

## 2023-10-15 NOTE — ED Triage Notes (Signed)
 C/O cough onset 4 days ago without rhinorrhea or congestion. Denies fevers. C/O polyuria x 1 wk without dysuria or abd pain. C/O sensation of movement in lower abd -- like when you are pregnant, but denies any abd pain. States she has not had amenorrhea and had negative home preg test 3 days ago.

## 2023-10-17 LAB — URINE CULTURE: Culture: 10000 — AB

## 2023-10-18 ENCOUNTER — Ambulatory Visit: Payer: Self-pay

## 2023-10-21 MED ORDER — CEFDINIR 300 MG PO CAPS: 300.0000 mg | ORAL_CAPSULE | Freq: Two times a day (BID) | ORAL | 0 refills | Status: AC

## 2023-11-14 ENCOUNTER — Encounter: Payer: Medicaid Other | Admitting: Family

## 2023-11-14 NOTE — Progress Notes (Signed)
 Erroneous encounter-disregard

## 2023-11-17 ENCOUNTER — Encounter (INDEPENDENT_AMBULATORY_CARE_PROVIDER_SITE_OTHER): Payer: Self-pay | Admitting: Family Medicine

## 2023-11-17 ENCOUNTER — Ambulatory Visit (INDEPENDENT_AMBULATORY_CARE_PROVIDER_SITE_OTHER): Admitting: Family Medicine

## 2023-11-17 DIAGNOSIS — E785 Hyperlipidemia, unspecified: Secondary | ICD-10-CM

## 2023-11-17 DIAGNOSIS — Z6841 Body Mass Index (BMI) 40.0 and over, adult: Secondary | ICD-10-CM | POA: Diagnosis not present

## 2023-11-17 DIAGNOSIS — E559 Vitamin D deficiency, unspecified: Secondary | ICD-10-CM

## 2023-11-17 MED ORDER — VITAMIN D (ERGOCALCIFEROL) 1.25 MG (50000 UNIT) PO CAPS: 50000.0000 [IU] | ORAL_CAPSULE | ORAL | 0 refills | Status: DC

## 2023-11-17 NOTE — Progress Notes (Signed)
   SUBJECTIVE:  Chief Complaint: Obesity  Interim History: Patient voices she hasn't been sleeping much.  Her childcare help has not been around to help her much so it has been falling all on her. That help is coming back next month.  Until that time she voices life is mostly up and down.  Foodwise she hasn't been eating much which she feels is contributing to her lack of weight loss.  She is not eating much but is trying to be mindful of food choices.  Morgan Mathews is here to discuss her progress with her obesity treatment plan. She is on the personalized plan and states she is following her eating plan approximately 70 % of the time. She states she is not exercising, but is working.   OBJECTIVE: Visit Diagnoses: Problem List Items Addressed This Visit       Other   Hyperlipidemia LDL goal <100   Morbid obesity (HCC) - Primary   Vitamin D  deficiency   Relevant Medications   Vitamin D , Ergocalciferol , (DRISDOL ) 1.25 MG (50000 UNIT) CAPS capsule   Other Visit Diagnoses       BMI 40.0-44.9, adult (HCC)           Vitals Temp: 97.8 F (36.6 C) BP: 99/68 Pulse Rate: 65 SpO2: 97 %   Anthropometric Measurements Height: 5' 6 (1.676 m) Weight: 272 lb (123.4 kg) BMI (Calculated): 43.92 Weight at Last Visit: 267 lb Weight Lost Since Last Visit: 0 Weight Gained Since Last Visit: 5 Starting Weight: 278 lb Total Weight Loss (lbs): 6 lb (2.722 kg)   Body Composition  Body Fat %: 50.1 % Fat Mass (lbs): 136.4 lbs Muscle Mass (lbs): 129.2 lbs Total Body Water (lbs): 86.8 lbs Visceral Fat Rating : 14   Other Clinical Data Fasting: no Labs: no Today's Visit #: 6 Starting Date: 02/27/23 Comments: Personalized     ASSESSMENT AND PLAN: Assessment & Plan Hyperlipidemia LDL goal <100 LDL in early August of 112 which is still slightly above goal of less than 100.  Patient to continue lifestyle modifications at this time. Vitamin D  deficiency Needs refill of prescription  strength vitamin D  at this time.  Continue weekly supplementation as patient reports no nausea, vomiting, or muscle weakness.  Will need repeat vitamin D  level after 3 to 4 months of consistent supplementation. Morbid obesity (HCC)  BMI 40.0-44.9, adult (HCC)    Diet: [place] is currently in the action stage of change. As such, her goal is to continue with weight loss efforts and has agreed to  getting back to her personalized plan when her childcare is available again.   Exercise:  All adults should avoid inactivity. Some activity is better than none, and adults who participate in any amount of physical activity, gain some health benefits.  Behavior Modification:  We discussed the following Behavioral Modification Strategies today: increasing lean protein intake, decreasing simple carbohydrates, meal planning and cooking strategies, and planning for success.   Return in about 5 weeks (around 12/22/2023).   She was informed of the importance of frequent follow up visits to maximize her success with intensive lifestyle modifications for her multiple health conditions.  Attestation Statements:   Reviewed by clinician on day of visit: allergies, medications, problem list, medical history, surgical history, family history, social history, and previous encounter notes.     Adelita Cho, MD

## 2023-11-23 NOTE — Assessment & Plan Note (Signed)
 LDL in early August of 112 which is still slightly above goal of less than 100.  Patient to continue lifestyle modifications at this time.

## 2023-11-23 NOTE — Assessment & Plan Note (Signed)
 Needs refill of prescription strength vitamin D  at this time.  Continue weekly supplementation as patient reports no nausea, vomiting, or muscle weakness.  Will need repeat vitamin D  level after 3 to 4 months of consistent supplementation.

## 2023-12-30 ENCOUNTER — Ambulatory Visit (INDEPENDENT_AMBULATORY_CARE_PROVIDER_SITE_OTHER): Payer: Self-pay | Admitting: Family Medicine

## 2024-01-02 ENCOUNTER — Encounter: Payer: Self-pay | Admitting: Family

## 2024-01-02 ENCOUNTER — Ambulatory Visit (INDEPENDENT_AMBULATORY_CARE_PROVIDER_SITE_OTHER): Admitting: Family

## 2024-01-02 VITALS — BP 106/70 | HR 70 | Temp 97.6°F | Resp 18 | Ht 66.0 in | Wt 292.0 lb

## 2024-01-02 DIAGNOSIS — N926 Irregular menstruation, unspecified: Secondary | ICD-10-CM

## 2024-01-02 NOTE — Progress Notes (Signed)
 Patient is 32 days late for menstrual cycle, took pregnancy and does not know if it is positive or negative

## 2024-01-02 NOTE — Progress Notes (Signed)
 Patient ID: Morgan Mathews, female    DOB: 11/13/1990  MRN: [id number]  CC: Pregnancy Test   Subjective: Morgan Mathews is a 33 y.o. female who presents for pregnancy test.   Her concerns today include:  States her period is 32 days late. States she took a home pregnancy test and unable to tell it was positive.   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 RevitaPlus Womens Vitamin (Patient not taking: Reported on 01/02/2024)     polyethylene glycol powder (GLYCOLAX /MIRALAX ) 17 GM/SCOOP powder Take 17 g by mouth daily as needed. (Patient not taking: Reported on 11/17/2023) 255 g 0   UNABLE TO FIND Med Name: Women's Probiotic with Rosey Danas (Patient not taking: Reported on 01/02/2024)     Vitamin D , Ergocalciferol , (DRISDOL ) 1.25 MG (50000 UNIT) CAPS capsule Take 1 capsule (50,000 Units total) by mouth every 7 (seven) days. (Patient not taking: Reported on 01/02/2024) 12 capsule 0   No current facility-administered medications on file prior to visit.    No Known Allergies  Social History   Socioeconomic History   Marital status: Married    Spouse name: Musah Iddrisu   Number of children: 2   Years of education: 8   Highest education level: 12th grade  Occupational History   Not on file  Tobacco Use   Smoking status: Never    Smokeless tobacco: Never  Vaping Use   Vaping status: Never Used  Substance and Sexual Activity   Alcohol use: No   Drug use: No   Sexual activity: Yes    Birth control/protection: None  Other Topics Concern   Not on file  Social History Narrative   Originally from [place]   Came to [place]. In 2015.   Lives at home with husband and 2 children.   Social Drivers of Corporate Investment Banker Strain: Low Risk  (12/24/2022)   Overall Financial Resource Strain (CARDIA)    Difficulty of Paying Living Expenses: Not hard at all  Food Insecurity: No Food Insecurity (08/21/2023)   Hunger Vital Sign    Worried About Running Out of Food in the Last Year: Never true    Ran Out of Food in the Last Year: Never true  Transportation Needs: No Transportation Needs (08/21/2023)   PRAPARE - Administrator, Civil Service (Medical): No    Lack of Transportation (Non-Medical): No  Physical Activity: Insufficiently Active (12/24/2022)   Exercise Vital Sign    Days of Exercise per Week: 4 days    Minutes of Exercise per Session: 30 min  Stress: No Stress Concern Present (12/24/2022)   [employer] of Occupational Health - Occupational Stress Questionnaire    Feeling of Stress : Not at all  Social Connections: Socially Integrated (12/24/2022)   Social Connection and Isolation Panel    Frequency of Communication  with Friends and Family: More than three times a week    Frequency of Social Gatherings with Friends and Family: Never    Attends Religious Services: More than 4 times per year    Active Member of [employer] or Organizations: Yes    Attends Banker Meetings: Never    Marital Status: Married  Catering Manager Violence: Not At Risk (08/21/2023)   Humiliation, Afraid, Rape, and Kick questionnaire    Fear of Current or Ex-Partner: No    Emotionally Abused: No    Physically Abused: No    Sexually Abused: No    Family History  Problem Relation Age of Onset   Diabetes  Mother    Hyperlipidemia Father    Healthy Father     Past Surgical History:  Procedure Laterality Date   NO PAST SURGERIES      ROS: Review of Systems Negative except as stated above  PHYSICAL EXAM: BP 106/70   Pulse 70   Temp 97.6 F (36.4 C) (Oral)   Resp 18   Ht 5' 6 (1.676 m)   Wt 292 lb (132.5 kg)   LMP 11/02/2023 (Exact Date)   SpO2 96%   BMI 47.13 kg/m   Physical Exam HENT:     Head: Normocephalic and atraumatic.     Nose: Nose normal.     Mouth/Throat:     Mouth: Mucous membranes are moist.     Pharynx: Oropharynx is clear.  Eyes:     Extraocular Movements: Extraocular movements intact.     Conjunctiva/sclera: Conjunctivae normal.     Pupils: Pupils are equal, round, and reactive to light.  Cardiovascular:     Rate and Rhythm: Normal rate and regular rhythm.     Pulses: Normal pulses.     Heart sounds: Normal heart sounds.  Pulmonary:     Effort: Pulmonary effort is normal.     Breath sounds: Normal breath sounds.  Musculoskeletal:        General: Normal range of motion.     Cervical back: Normal range of motion and neck supple.  Neurological:     General: No focal deficit present.     Mental Status: She is alert and oriented to person, place, and time.  Psychiatric:        Mood and Affect: Mood normal.        Behavior: Behavior normal.     ASSESSMENT AND PLAN: 1. Late menses (Primary) - Routine screening.  - Referral to Obstetrics Gynecology for evaluation/management.  - POCT urine pregnancy; Future - hCG, quantitative, pregnancy - Ambulatory referral to Obstetrics / Gynecology   Patient was given the opportunity to ask questions.  Patient verbalized understanding of the plan and was able to repeat key elements of the plan. Patient was given clear instructions to go to Emergency Department or return to medical center if symptoms don't improve, worsen, or new problems develop.The patient verbalized understanding.   Orders Placed This  Encounter  Procedures   hCG, quantitative, pregnancy   Ambulatory referral to Obstetrics / Gynecology   POCT urine pregnancy   Return for Follow-up as needed.  Greig JINNY Chute, NP

## 2024-01-05 ENCOUNTER — Telehealth: Payer: Self-pay | Admitting: Emergency Medicine

## 2024-01-05 ENCOUNTER — Ambulatory Visit: Payer: Self-pay | Admitting: Family

## 2024-01-05 LAB — POCT URINE PREGNANCY: Preg Test, Ur: NEGATIVE

## 2024-01-05 NOTE — Telephone Encounter (Signed)
 I called patient to make her aware that results are not back yet

## 2024-01-05 NOTE — Telephone Encounter (Unsigned)
 Copied from CRM [id number]. Topic: Clinical - Lab/Test Results >> Jan 05, 2024  1:53 PM Fonda T wrote: Reason for CRM: Pt calling, requesting test results, states she has not heard anything back regarding lab pregnancy blood results.   Pt requesting a return call for results, per chart review, no lab results found. .  Pt can be reached back at [phone]  Aware of same day call back.

## 2024-01-16 ENCOUNTER — Telehealth: Payer: Self-pay

## 2024-01-16 NOTE — Telephone Encounter (Signed)
 Copied from CRM #[id number]. Topic: Referral - Status >> Jan 16, 2024 11:09 AM Nathanel BROCKS wrote: Reason for CRM: pt called to check on her OB-GYN referral. Please advise.

## 2024-01-16 NOTE — Telephone Encounter (Signed)
 Schedule appointment?

## 2024-01-16 NOTE — Telephone Encounter (Unsigned)
 Copied from CRM #[id number]. Topic: Clinical - Medication Question >> Jan 16, 2024 11:12 AM Nathanel BROCKS wrote: Reason for CRM: pt is wanting to refill her weigh loss medication but she doesn't know the name of it. Please call and advise.    ----------------------------------------------------------------------- From previous Reason for Contact - Medication Refill: Medication:   Has the patient contacted their pharmacy?   (Agent: If no, request that the patient contact the pharmacy for the refill. If patient does not wish to contact the pharmacy document the reason why and proceed with request.) (Agent: If yes, when and what did the pharmacy advise?)  This is the patient's preferred pharmacy:  [facility] [address] ([place]),  - [address] [place] ([place]) [place] [address] Phone: [phone] Fax: [phone]  [id number] [address] ([place]), [place] - [address] [place] ([place]) [place] [address] Phone: [phone] Fax: [phone]  Is this the correct pharmacy for this prescription?   If no, delete pharmacy and type the correct one.   Has the prescription been filled recently?    Is the patient out of the medication?    Has the patient been seen for an appointment in the last year OR does the patient have an upcoming appointment?    Can we respond through MyChart?    Agent: Please be advised that Rx refills may take up to 3 business days. We ask that you follow-up with your pharmacy.

## 2024-01-19 NOTE — Telephone Encounter (Signed)
 Pt scheduled

## 2024-02-26 ENCOUNTER — Encounter: Payer: Self-pay | Admitting: Family

## 2024-02-26 NOTE — Progress Notes (Signed)
 Erroneous encounter-disregard

## 2024-03-05 ENCOUNTER — Ambulatory Visit: Payer: Self-pay | Admitting: Obstetrics and Gynecology

## 2024-03-05 ENCOUNTER — Encounter: Payer: Self-pay | Admitting: Obstetrics and Gynecology

## 2024-03-05 VITALS — BP 132/84 | HR 76 | Ht 66.0 in | Wt 302.0 lb

## 2024-03-05 DIAGNOSIS — N911 Secondary amenorrhea: Secondary | ICD-10-CM

## 2024-03-05 DIAGNOSIS — Z6841 Body Mass Index (BMI) 40.0 and over, adult: Secondary | ICD-10-CM

## 2024-03-05 LAB — POCT URINE PREGNANCY: Preg Test, Ur: NEGATIVE

## 2024-03-05 MED ORDER — MEDROXYPROGESTERONE ACETATE 10 MG PO TABS: 10.0000 mg | ORAL_TABLET | Freq: Every day | ORAL | 2 refills | Status: AC

## 2024-03-05 NOTE — Progress Notes (Signed)
 34 yo P4 with LMP 10/29/23 and BMI 48 presenting for the evaluation of irregular menses. Patient reports a history of a monthly period lasting 5 days. Her last normal period was in October 2025. She is sexually active without contraception and does not desire to conceive. Patient denies any headaches or visual changes, female pattern hair distribution on face or chest/abdomen. Patient admits to increase stress at work. She has been working night shift for the past 2 years. She denies milky drainage from breast. Patient is without any other complaints. Patient is aware that she is overweight and was surprised to see her weight at this visit  Past Medical History:  Diagnosis Date   Chronic bilateral thoracic back pain 08/28/2016   Gestational diabetes    High cholesterol    Prediabetes    Strabismus 1994   Right exotropia   Past Surgical History:  Procedure Laterality Date   NO PAST SURGERIES     Family History  Problem Relation Age of Onset   Diabetes Mother    Hyperlipidemia Father    Healthy Father    Social History   Socioeconomic History   Marital status: Married    Spouse name: Musah Iddrisu   Number of children: 2   Years of education: 8   Highest education level: 12th grade  Occupational History   Not on file  Tobacco Use   Smoking status: Never   Smokeless tobacco: Never  Vaping Use   Vaping status: Never Used  Substance and Sexual Activity   Alcohol use: No   Drug use: No   Sexual activity: Yes    Birth control/protection: None  Other Topics Concern   Not on file  Social History Narrative   Originally from [place]   Came to [employer]. In 2015.   Lives at home with husband and 2 children.   Social Drivers of Health   Tobacco Use: Low Risk (03/05/2024)   Patient History    Smoking Tobacco Use: Never    Smokeless Tobacco Use: Never    Passive Exposure: Not on file  Financial Resource Strain: Low Risk (12/24/2022)   Overall Financial Resource Strain (CARDIA)     Difficulty of Paying Living Expenses: Not hard at all  Food Insecurity: No Food Insecurity (08/21/2023)   Epic    Worried About Radiation Protection Practitioner of Food in the Last Year: Never true    Ran Out of Food in the Last Year: Never true  Transportation Needs: No Transportation Needs (08/21/2023)   Epic    Lack of Transportation (Medical): No    Lack of Transportation (Non-Medical): No  Physical Activity: Insufficiently Active (12/24/2022)   Exercise Vital Sign    Days of Exercise per Week: 4 days    Minutes of Exercise per Session: 30 min  Stress: No Stress Concern Present (12/24/2022)   [employer] of Occupational Health - Occupational Stress Questionnaire    Feeling of Stress : Not at all  Social Connections: Socially Integrated (12/24/2022)   Social Connection and Isolation Panel    Frequency of Communication with Friends and Family: More than three times a week    Frequency of Social Gatherings with Friends and Family: Never    Attends Religious Services: More than 4 times per year    Active Member of Clubs or Organizations: Yes    Attends Banker Meetings: Never    Marital Status: Married  Depression (PHQ2-9): Low Risk (01/02/2024)   Depression (PHQ2-9)    PHQ-2 Score: 0  Alcohol Screen: Low Risk (12/24/2022)   Alcohol Screen    Last Alcohol Screening Score (AUDIT): 0  Housing: Unknown (08/21/2023)   Epic    Unable to Pay for Housing in the Last Year: No    Number of Times Moved in the Last Year: Not on file    Homeless in the Last Year: No  Utilities: Not At Risk (12/24/2022)   AHC Utilities    Threatened with loss of utilities: No  Health Literacy: Adequate Health Literacy (12/24/2022)   B1300 Health Literacy    Frequency of need for help with medical instructions: Never   ROS See pertinent in HPI. All other systems reviewed and non contributory  Blood pressure 132/84, pulse 76, height 5' 6 (1.676 m), weight (!) 302 lb (137 kg), last menstrual period  10/29/2023. GENERAL: Well-developed, well-nourished female in no acute distress.  ABDOMEN: Soft, nontender, nondistended. No organomegaly. PELVIC: Not performed EXTREMITIES: No cyanosis, clubbing, or edema, 2+ distal pulses.  Office UPT- negative  A/P 34 yo P4 here with secondary amenorrhea - Will obtain labs TSH, FSH, prolactin, DHEA - Patient is current on pap smear- normal 12/2022 - Patient will be contacted with abnormal results - Discussed role of obesity in menstrual cycle irregularities. Patient agreed to referral to healthy weight and wellness - Discussed benefits of provera  challenge and patient agrees with plans to contact office if no menses

## 2024-03-25 ENCOUNTER — Ambulatory Visit: Admitting: Family

## 2024-03-26 ENCOUNTER — Encounter: Admitting: Family
